# Patient Record
Sex: Female | Born: 1980 | Race: White | Hispanic: Yes | Marital: Married | Smoking: Never smoker
Health system: Southern US, Community
[De-identification: ages and names within clinical notes are randomized; demographics above are authoritative.]

## PROBLEM LIST (undated history)

## (undated) DIAGNOSIS — O24419 Gestational diabetes mellitus in pregnancy, unspecified control: Secondary | ICD-10-CM

## (undated) DIAGNOSIS — F32A Depression, unspecified: Secondary | ICD-10-CM

## (undated) DIAGNOSIS — F329 Major depressive disorder, single episode, unspecified: Secondary | ICD-10-CM

## (undated) DIAGNOSIS — G35 Multiple sclerosis: Secondary | ICD-10-CM

## (undated) HISTORY — PX: NO PAST SURGERIES: SHX2092

## (undated) HISTORY — DX: Depression, unspecified: F32.A

## (undated) HISTORY — DX: Multiple sclerosis: G35

---
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
## 2006-06-07 ENCOUNTER — Inpatient Hospital Stay (HOSPITAL_COMMUNITY): Admission: AD | Admit: 2006-06-07 | Discharge: 2006-06-07 | Payer: Self-pay | Admitting: Obstetrics

## 2006-08-11 ENCOUNTER — Inpatient Hospital Stay (HOSPITAL_COMMUNITY): Admission: AD | Admit: 2006-08-11 | Discharge: 2006-08-11 | Payer: Self-pay | Admitting: Obstetrics

## 2006-08-12 ENCOUNTER — Inpatient Hospital Stay (HOSPITAL_COMMUNITY): Admission: AD | Admit: 2006-08-12 | Discharge: 2006-08-14 | Payer: Self-pay | Admitting: Obstetrics

## 2008-06-25 ENCOUNTER — Encounter: Admission: RE | Admit: 2008-06-25 | Discharge: 2008-06-25 | Payer: Self-pay | Admitting: Family Medicine

## 2008-06-27 ENCOUNTER — Ambulatory Visit (HOSPITAL_COMMUNITY): Admission: RE | Admit: 2008-06-27 | Discharge: 2008-06-27 | Payer: Self-pay | Admitting: Obstetrics & Gynecology

## 2008-07-08 ENCOUNTER — Ambulatory Visit: Payer: Self-pay | Admitting: Obstetrics & Gynecology

## 2008-07-08 ENCOUNTER — Inpatient Hospital Stay (HOSPITAL_COMMUNITY): Admission: AD | Admit: 2008-07-08 | Discharge: 2008-07-08 | Payer: Self-pay | Admitting: Family Medicine

## 2008-10-22 ENCOUNTER — Ambulatory Visit: Payer: Self-pay | Admitting: Family

## 2008-10-22 ENCOUNTER — Inpatient Hospital Stay (HOSPITAL_COMMUNITY): Admission: AD | Admit: 2008-10-22 | Discharge: 2008-10-25 | Payer: Self-pay | Admitting: Obstetrics and Gynecology

## 2011-01-10 LAB — CBC
HCT: 36.3 % (ref 36.0–46.0)
Hemoglobin: 12.1 g/dL (ref 12.0–15.0)
MCHC: 33.3 g/dL (ref 30.0–36.0)
MCV: 87.2 fL (ref 78.0–100.0)
Platelets: 138 10*3/uL — ABNORMAL LOW (ref 150–400)
RBC: 4.17 MIL/uL (ref 3.87–5.11)
RDW: 14.9 % (ref 11.5–15.5)
WBC: 8.9 10*3/uL (ref 4.0–10.5)

## 2011-06-27 LAB — BASIC METABOLIC PANEL
BUN: 5 — ABNORMAL LOW
Calcium: 8.5
GFR calc non Af Amer: >60
Glucose, Bld: 105 — ABNORMAL HIGH
Sodium: 133 — ABNORMAL LOW

## 2011-06-27 LAB — URINE MICROSCOPIC-ADD ON

## 2011-06-27 LAB — URINALYSIS, ROUTINE W REFLEX MICROSCOPIC
Bilirubin Urine: NEGATIVE
Ketones, ur: NEGATIVE
Specific Gravity, Urine: 1.025
Urobilinogen, UA: 0.2

## 2014-01-13 LAB — CYTOLOGY - PAP: Pap: NEGATIVE

## 2015-09-27 NOTE — L&D Delivery Note (Signed)
Delivery Note At 3:35 AM a viable female was delivered via Vaginal, Spontaneous Delivery (Presentation: vertex; OA).  APGAR: 8, 9; weight 8 lb 10.1 oz (3915 g).   Placenta status: spontaneous, intact.  Cord: 3 vessel with the following complications: none.  Cord pH: not obtained  Anesthesia:  Epidural Episiotomy: None Lacerations:  1st degree Suture Repair: 2.0 vicryl Est. Blood Loss (mL):   Mom to postpartum.  Baby to Couplet care / Skin to Skin.  [name] Li 07/04/2016, 3:53 AM

## 2015-12-21 ENCOUNTER — Other Ambulatory Visit (HOSPITAL_COMMUNITY): Payer: Self-pay | Admitting: Physician Assistant

## 2015-12-21 DIAGNOSIS — Z369 Encounter for antenatal screening, unspecified: Secondary | ICD-10-CM

## 2015-12-21 LAB — OB RESULTS CONSOLE ABO/RH: RH TYPE: NEGATIVE

## 2015-12-21 LAB — OB RESULTS CONSOLE HEPATITIS B SURFACE ANTIGEN: Hepatitis B Surface Ag: NEGATIVE

## 2015-12-21 LAB — OB RESULTS CONSOLE GC/CHLAMYDIA
Chlamydia: NEGATIVE
Gonorrhea: NEGATIVE

## 2015-12-21 LAB — OB RESULTS CONSOLE HGB/HCT, BLOOD
HCT: 40 %
Hemoglobin: 13.4 g/dL

## 2015-12-21 LAB — OB RESULTS CONSOLE ANTIBODY SCREEN: Antibody Screen: NEGATIVE

## 2015-12-21 LAB — OB RESULTS CONSOLE PLATELET COUNT: Platelets: 160 10*3/uL

## 2015-12-21 LAB — OB RESULTS CONSOLE RUBELLA ANTIBODY, IGM: RUBELLA: IMMUNE

## 2015-12-21 LAB — OB RESULTS CONSOLE VARICELLA ZOSTER ANTIBODY, IGG: VARICELLA IGG: IMMUNE

## 2015-12-21 LAB — OB RESULTS CONSOLE RPR: RPR: NONREACTIVE

## 2015-12-30 ENCOUNTER — Ambulatory Visit (HOSPITAL_COMMUNITY)
Admission: RE | Admit: 2015-12-30 | Discharge: 2015-12-30 | Disposition: A | Discharge status: Home / Self Care | Payer: Self-pay | Source: Ambulatory Visit | Attending: Physician Assistant | Admitting: Physician Assistant

## 2015-12-30 ENCOUNTER — Encounter (HOSPITAL_COMMUNITY): Payer: Self-pay

## 2015-12-30 DIAGNOSIS — O99211 Obesity complicating pregnancy, first trimester: Secondary | ICD-10-CM | POA: Insufficient documentation

## 2015-12-30 DIAGNOSIS — Z369 Encounter for antenatal screening, unspecified: Secondary | ICD-10-CM

## 2015-12-30 DIAGNOSIS — Z36 Encounter for antenatal screening of mother: Secondary | ICD-10-CM | POA: Insufficient documentation

## 2015-12-30 DIAGNOSIS — O24312 Unspecified pre-existing diabetes mellitus in pregnancy, second trimester: Secondary | ICD-10-CM | POA: Insufficient documentation

## 2015-12-30 DIAGNOSIS — Z3A12 12 weeks gestation of pregnancy: Secondary | ICD-10-CM | POA: Insufficient documentation

## 2015-12-30 HISTORY — DX: Gestational diabetes mellitus in pregnancy, unspecified control: O24.419

## 2015-12-30 NOTE — ED Notes (Signed)
Pt having lower abd cramping and spotting yesterday after moping.  The spotting stopped after resting, continues to have cramping.

## 2016-01-02 ENCOUNTER — Encounter: Payer: Self-pay | Admitting: *Deleted

## 2016-01-02 DIAGNOSIS — Z8632 Personal history of gestational diabetes: Secondary | ICD-10-CM | POA: Insufficient documentation

## 2016-01-02 DIAGNOSIS — O0990 Supervision of high risk pregnancy, unspecified, unspecified trimester: Secondary | ICD-10-CM

## 2016-01-02 DIAGNOSIS — O09299 Supervision of pregnancy with other poor reproductive or obstetric history, unspecified trimester: Secondary | ICD-10-CM | POA: Insufficient documentation

## 2016-01-02 DIAGNOSIS — O24419 Gestational diabetes mellitus in pregnancy, unspecified control: Secondary | ICD-10-CM

## 2016-01-02 HISTORY — DX: Supervision of high risk pregnancy, unspecified, unspecified trimester: O09.90

## 2016-01-04 ENCOUNTER — Ambulatory Visit: Payer: Medicaid Other | Admitting: *Deleted

## 2016-01-04 VITALS — Ht 66.0 in | Wt 218.6 lb

## 2016-01-04 DIAGNOSIS — O219 Vomiting of pregnancy, unspecified: Secondary | ICD-10-CM

## 2016-01-04 MED ORDER — PROMETHAZINE HCL 25 MG PO TABS: 25.0000 mg | ORAL_TABLET | Freq: Four times a day (QID) | ORAL | Status: DC | PRN

## 2016-01-04 NOTE — Progress Notes (Signed)
Nutrition note: GDM diet education Pt has h/o obesity & was recently diagnosed with GDM. Pt has lost 4.4# @ [redacted]w[redacted]d (pregravid wt: 223#). Pt reports she thinks she has lost this wt because she has been eating healthier & stopped drinking soda. Pt also reports having some N&V as well as some heartburn. Pt reports she was taking Phenergan & it was helping but ran out- passed this information onto a nurse to see if it can be refilled.  Pt reports eating 3 meals & 2 snacks/d. Pt is taking a PNV. Pt reports no walking or physical activity. Pt received verbal & written education in Spanish via an interpreter about GDM diet. Provided tips to decrease N&V. Encouraged ~30 mins of walking/d. Discussed wt gain goals of 11-20# or 0.5#/wk. Pt agrees to follow GDM diet with 3 meals & 1-3 snacks/d with proper CHO/ protein combination. Pt does not have WIC but plans to apply. Pt plans to BF. F/u in 4-6 wks Blondell Reveal, MS, RD, LDN, [facility]

## 2016-01-07 ENCOUNTER — Other Ambulatory Visit (HOSPITAL_COMMUNITY): Payer: Self-pay

## 2016-01-07 ENCOUNTER — Encounter: Payer: Self-pay | Admitting: *Deleted

## 2016-01-11 ENCOUNTER — Ambulatory Visit (INDEPENDENT_AMBULATORY_CARE_PROVIDER_SITE_OTHER): Payer: Self-pay | Admitting: Family Medicine

## 2016-01-11 ENCOUNTER — Encounter: Payer: Self-pay | Admitting: Family Medicine

## 2016-01-11 VITALS — BP 126/75 | HR 84 | Temp 98.3°F | Wt 222.9 lb

## 2016-01-11 DIAGNOSIS — O24419 Gestational diabetes mellitus in pregnancy, unspecified control: Secondary | ICD-10-CM

## 2016-01-11 DIAGNOSIS — O360110 Maternal care for anti-D [Rh] antibodies, first trimester, not applicable or unspecified: Secondary | ICD-10-CM

## 2016-01-11 DIAGNOSIS — O26899 Other specified pregnancy related conditions, unspecified trimester: Secondary | ICD-10-CM | POA: Insufficient documentation

## 2016-01-11 DIAGNOSIS — E669 Obesity, unspecified: Secondary | ICD-10-CM

## 2016-01-11 DIAGNOSIS — O99211 Obesity complicating pregnancy, first trimester: Secondary | ICD-10-CM

## 2016-01-11 DIAGNOSIS — O360111 Maternal care for anti-D [Rh] antibodies, first trimester, fetus 1: Secondary | ICD-10-CM

## 2016-01-11 DIAGNOSIS — Z6791 Unspecified blood type, Rh negative: Secondary | ICD-10-CM | POA: Insufficient documentation

## 2016-01-11 DIAGNOSIS — Z113 Encounter for screening for infections with a predominantly sexual mode of transmission: Secondary | ICD-10-CM

## 2016-01-11 DIAGNOSIS — O99210 Obesity complicating pregnancy, unspecified trimester: Secondary | ICD-10-CM | POA: Insufficient documentation

## 2016-01-11 DIAGNOSIS — O0992 Supervision of high risk pregnancy, unspecified, second trimester: Secondary | ICD-10-CM

## 2016-01-11 LAB — POCT URINALYSIS DIP (DEVICE)
BILIRUBIN URINE: NEGATIVE
Glucose, UA: NEGATIVE mg/dL
HGB URINE DIPSTICK: NEGATIVE
Ketones, ur: NEGATIVE mg/dL
Nitrite: NEGATIVE
PH: 7 (ref 5.0–8.0)
Protein, ur: 30 mg/dL — AB
SPECIFIC GRAVITY, URINE: 1.025 (ref 1.005–1.030)
Urobilinogen, UA: 0.2 mg/dL (ref 0.0–1.0)

## 2016-01-11 LAB — HIV ANTIBODY (ROUTINE TESTING W REFLEX): HIV 1&2 Ab, 4th Generation: NONREACTIVE

## 2016-01-11 MED ORDER — GLYBURIDE 2.5 MG PO TABS: 2.5000 mg | ORAL_TABLET | Freq: Every day | ORAL | Status: DC

## 2016-01-11 NOTE — Progress Notes (Signed)
U/S for anatomy scheduled 05/19 @ 10am

## 2016-01-11 NOTE — Progress Notes (Signed)
Subjective:  [name] Li is a 34 y.o. G3P2004 at [redacted]w[redacted]d being seen today for ongoing prenatal care.  She is currently monitored for the following issues for this high-risk pregnancy and has Supervision of high risk pregnancy, antepartum; Gestational diabetes mellitus (GDM), antepartum; Rh negative status during pregnancy, antepartum; and Obesity affecting pregnancy, antepartum on her problem list.  Patient reports no complaints.   .  .  Movement: Present. Denies leaking of fluid.   The following portions of the patient's history were reviewed and updated as appropriate: allergies, current medications, past family history, past medical history, past social history, past surgical history and problem list. Problem list updated.  Objective:   Filed Vitals:   01/11/16 0830  BP: 126/75  Pulse: 84  Temp: 98.3 F (36.8 C)  Weight: 222 lb 14.4 oz (101.107 kg)    Fetal Status: Fetal Heart Rate (bpm): 163   Movement: Present     General:  Alert, oriented and cooperative. Patient is in no acute distress.  Skin: Skin is warm and dry. No rash noted.   Cardiovascular: Normal heart rate noted  Respiratory: Normal respiratory effort, no problems with respiration noted  Abdomen: Soft, gravid, appropriate for gestational age. Pain/Pressure: Present     Pelvic:       Cervical exam deferred        Extremities: Normal range of motion.  Edema: None  Mental Status: Normal mood and affect. Normal behavior. Normal judgment and thought content.   Urinalysis:     Fasting: 82-116 (4 of 7 above) Bfast  94-127 (2 of 6) Lunch 80-209 ( 1 of 6) Dinner 101-116 (none above)  Assessment and Plan:  Pregnancy: G3P2004 at [redacted]w[redacted]d  1. Supervision of high risk pregnancy, antepartum, second trimester - Added box and ordered labs that were not resulted in [facility] prenatal chart - Culture, OB Urine - GC/Chlamydia probe amp ([place])not at [facility] - HIV antibody (with reflex) - Scheduled anatomy US  2.  Gestational diabetes mellitus (GDM), antepartum, gestational diabetes method of control unspecified - Reviewed log and 50% of fasting sugars were above 90. Post prandials are <25%, after bfast is most commonly out of range est when fasting elevated. Will target these with qhs glyburide  - glyBURIDE (DIABETA) 2.5 MG tablet; Take 1 tablet (2.5 mg total) by mouth at bedtime.  Dispense: 30 tablet; Refill: 6  3. Rh negative status during pregnancy, antepartum, first trimester, fetus 1 - rhogam at 28 weeks  4. Obesity affecting pregnancy, antepartum, first trimester -Recommended 11-15 lbs weight gain   Preterm labor symptoms and general obstetric precautions including but not limited to vaginal bleeding, contractions, leaking of fluid and fetal movement were reviewed in detail with the patient. Please refer to After Visit Summary for other counseling recommendations.  Return in about 1 week (around 01/18/2016) for DM follow up- with DM education.   Federico Flake, MD

## 2016-01-11 NOTE — Patient Instructions (Signed)
Diabetes mellitus gestacional (Gestational Diabetes Mellitus) La diabetes mellitus gestacional, ms comnmente conocida como diabetes gestacional es un tipo de diabetes que desarrollan algunas mujeres durante el embarazo. En la diabetes gestacional, el pncreas no produce suficiente insulina (una hormona) o las clulas son menos sensibles a la insulina producida (resistencia a la insulina), o ambas cosas. Normalmente, la insulina mueve los azcares de los alimentos a las clulas de los tejidos. Las clulas de los tejidos utilizan los azcares para obtener energa. La falta de insulina o la falta de una respuesta normal a la insulina hace que el exceso de azcar se acumule en la sangre en lugar de penetrar en las clulas de los tejidos. Como resultado, se producen niveles altos de azcar en la sangre (hiperglucemia). El efecto de los niveles altos de azcar (glucosa) puede causar muchos problemas.  FACTORES DE RIESGO Usted tiene mayor probabilidad de desarrollar diabetes gestacional si tiene antecedentes familiares de diabetes y tambin si tiene uno o ms de los siguientes factores de riesgo:  ndice de masa corporal superior a 30 (obesidad).  Embarazo previo con diabetes gestacional.  La edad avanzada en el momento del embarazo. Si se mantienen los niveles de glucosa en la sangre en un rango normal durante el embarazo, las mujeres pueden tener un embarazo saludable. Si los niveles de glucosa en la sangre no estn bien controlados, puede haber riesgos para usted, el feto o el recin nacido, o durante el trabajo de parto y el parto.  SNTOMAS  Si se presentan sntomas, stos son similares a los sntomas que normalmente experimentar durante el embarazo. Los sntomas de la diabetes gestacional son:   Aumento de la sed (polidipsia).  Aumento de la miccin (poliuria).  Orina con ms frecuencia durante la noche (nocturia).  Prdida de peso. La prdida de peso puede ser muy rpida.  Infecciones  frecuentes y recurrentes.  Cansancio (fatiga).  Debilidad.  Cambios en la visin, como visin borrosa.  Olor a fruta en el aliento.  Dolor abdominal. DIAGNSTICO La diabetes se diagnostica cuando hay aumento de los niveles de glucosa en la sangre. El nivel de glucosa en la sangre puede controlarse en uno o ms de los siguientes anlisis de sangre:  Medicin de glucosa en la sangre en ayunas. No se le permitir comer durante al menos 8 horas antes de que se tome una muestra de sangre.  Pruebas al azar de glucosa en la sangre. El nivel de glucosa en la sangre se controla en cualquier momento del da sin importar el momento en que haya comido.  Prueba de tolerancia a la glucosa oral (PTGO). La glucosa en la sangre se mide despus de no haber comido (ayunas) durante una a tres horas y despus de beber una bebida que contenga glucosa. Dado que las hormonas que causan la resistencia a la insulina son ms altas alrededor de las semanas 24 a 28 de embarazo, generalmente se realiza una PTGO durante ese tiempo. Si tiene factores de riesgo, en la primera visita prenatal pueden hacerle pruebas de deteccin de diabetes tipo 2 no diagnosticada. TRATAMIENTO  La diabetes gestacional debe controlarse en primer lugar con dieta y ejercicios. Pueden agregarse medicamentos, pero solo si son necesarios.  Usted tendr que tomar medicamentos para la diabetes o insulina diariamente para mantener los niveles de glucosa en la sangre en el rango deseado.  Usted tendr que combinar la dosis de insulina con la actividad fsica y la eleccin de alimentos saludables. Si tiene diabetes gestacional, el objetivo del tratamiento ser   mantener los siguientes niveles sanguneos de glucosa:  Antes de las comidas (preprandial): valor de 95 mg/dl o inferior.  Despus de las comidas (posprandial):  Una hora despus de la comida: valor de 140 mg/dl o inferior.  Dos horas despus de la comida: valor de 120 mg/dl o  inferior. Si tiene diabetes tipo 1 o tipo 2 preexistente, el objetivo del tratamiento ser mantener los siguientes niveles sanguneos de glucosa:  Antes de las comidas, a la hora de acostarse y durante la noche: de 60 a 99 mg/dl.  Despus de las comidas: valor mximo de 100 a 129 mg/dl. INSTRUCCIONES PARA EL CUIDADO EN EL HOGAR   Controle su nivel de hemoglobina A1c dos veces al ao.  Contrlese a diario el nivel de glucosa en la sangre segn las indicaciones de su mdico. Es comn realizar controles frecuentes de la glucosa en la sangre.  Supervise las cetonas en la orina cuando est enferma y segn las indicaciones de su mdico.  Tome el medicamento para la diabetes y adminstrese insulina segn las indicaciones de su mdico para mantener el nivel de glucosa en la sangre en el rango deseado.  Nunca se quede sin medicamento para la diabetes o sin insulina. Es necesario que la reciba todos los das.  Ajuste la insulina segn la ingesta de hidratos de carbono. Los hidratos de carbono pueden aumentar los niveles de glucosa en la sangre, pero deben incluirse en su dieta. Los hidratos de carbono aportan vitaminas, minerales y fibra que son una parte esencial de una dieta saludable. Los hidratos de carbono se encuentran en frutas, verduras, cereales integrales, productos lcteos, legumbres y alimentos que contienen azcares aadidos.  Consuma alimentos saludables. Alterne 3 comidas con 3 colaciones.  Aumente de peso saludablemente. El aumento del peso total vara de acuerdo con el ndice de masa corporal que tena antes del embarazo ([facility]).  Lleve una tarjeta de alerta mdica o use una pulsera o medalla de alerta mdica.  Lleve con usted una colacin de 15gramos de hidratos de carbono en todo momento para controlar los niveles bajos de glucosa en la sangre (hipoglucemia). Algunos ejemplos de colaciones de 15gramos de hidratos de carbono son los siguientes:  Tabletas de glucosa, 3 o 4.  Gel  de glucosa, tubo de 15 gramos.  Pasas de uva, 2 cucharadas (24 g).  Caramelos de goma, 6.  Galletas de animales, 8.  Jugo de fruta, gaseosa comn, o leche descremada, 4 onzas (120 ml).  Pastillas de goma, 9.  Reconocer la hipoglucemia. Durante el embarazo la hipoglucemia se produce cuando hay niveles de glucosa en la sangre de 60 mg/dl o menos. El riesgo de hipoglucemia aumenta durante el ayuno o cuando se saltea las comidas, durante o despus de realizar ejercicio intenso y mientras duerme. Los sntomas de hipoglucemia son:  Temblores o sacudidas.  Disminucin de la capacidad de concentracin.  Sudoracin.  Aumento de la frecuencia cardaca.  Dolor de cabeza.  Sequedad en la boca.  Hambre.  Irritabilidad.  Ansiedad.  Sueo agitado.  Alteracin del habla o de la coordinacin.  Confusin.  Tratar la hipoglucemia rpidamente. Si usted est alerta y puede tragar con seguridad, siga la regla de 15/15 que consiste en:  Tome entre 15 y 20gramos de glucosa de accin rpida o carbohidratos. Las opciones de accin rpida son un gel de glucosa, tabletas de glucosa, o 4 onzas (120 ml) de jugo de frutas, gaseosa comn, o leche baja en grasa.  Compruebe su nivel de glucosa en la sangre   15 minutos despus de tomar la glucosa.  Tome entre 15 y 20 gramos ms de glucosa si el nivel de glucosa en la sangre todava es de 70mg/dl o inferior.  Ingiera una comida o una colacin en el lapso de 1 hora una vez que los niveles de glucosa en la sangre vuelven a la normalidad.  Est atento a la poliuria (miccin excesiva) y la polidipsia (sensacin de mucha sed), que son los primeros signos de la hiperglucemia. El reconocimiento temprano de la hiperglucemia permite un tratamiento oportuno. Trate la hiperglucemia segn le indic su mdico.  Haga actividad fsica por lo menos 30minutos al da o como lo indique su mdico. Se recomienda que 30 minutos despus de cada comida, realice diez minutos  de actividad fsica para controlar los niveles de glucosa postprandial en la sangre.  Ajuste su dosis de insulina y la ingesta de alimentos, segn sea necesario, si inicia un nuevo ejercicio o deporte.  Siga su plan para los das de enfermedad cuando no pueda comer o beber como de costumbre.  Evite el tabaco y el alcohol.  Concurra a todas las visitas de control como se lo haya indicado el mdico.  Siga el consejo del mdico respecto a los controles prenatales y posteriores al parto (postparto), las visitas, la planificacin de las comidas, el ejercicio, los medicamentos, las vitaminas, los anlisis de sangre, otras pruebas mdicas y actividades fsicas.  Realice diariamente el cuidado de la piel y de los pies. Examine su piel y los pies diariamente para ver si tiene cortes, moretones, enrojecimiento, problemas en las uas, sangrado, ampollas o llagas.  Cepllese los dientes y encas por lo menos dos veces al da y use hilo dental al menos una vez por da. Concurra regularmente a las visitas de control con el dentista.  Programe un examen de vista durante el primer trimestre de su embarazo o como lo indique su mdico.  Comparta su plan de control de diabetes en el trabajo o en la escuela.  Mantngase al da con las vacunas.  Aprenda a manejar el estrs.  Obtenga la mayor cantidad posible de informacin sobre la diabetes y solicite ayuda siempre que sea necesario.  Obtenga informacin sobre el amamantamiento y analice esta posibilidad.  Debe controlar el nivel de azcar en la sangre de 6a 12semanas despus del parto. Esto se hace con una prueba de tolerancia a la glucosa oral (PTGO). SOLICITE ATENCIN MDICA SI:   No puede comer alimentos o beber por ms de 6 horas.  Tuvo nuseas o ha vomitado durante ms de 6 horas.  Tiene un nivel de glucosa en la sangre de 200 mg/dl y cetonas en la orina.  Presenta algn cambio en el estado mental.  Desarrolla problemas de visin.  Sufre  un dolor persistente de cabeza.  Siente dolor o molestias en la parte superior del abdomen.  Desarrolla una enfermedad grave adicional.  Tuvo diarrea durante ms de 6 horas.  Ha estado enfermo o ha tenido fiebre durante un par de das y no mejora. SOLICITE ATENCIN MDICA DE INMEDIATO SI:   Tiene dificultad para respirar.  Ya no siente los movimientos del beb.  Est sangrando o tiene flujo vaginal.  Comienza a tener contracciones o trabajo de parto prematuro. ASEGRESE DE QUE:  Comprende estas instrucciones.  Controlar su afeccin.  Recibir ayuda de inmediato si no mejora o si empeora.   Esta informacin no tiene como fin reemplazar el consejo del mdico. Asegrese de hacerle al mdico cualquier pregunta que tenga.     Document Released: 06/22/2005 Document Revised: 10/03/2014 Elsevier Interactive Patient Education 2016 Elsevier Inc.  

## 2016-01-12 LAB — GC/CHLAMYDIA PROBE AMP (~~LOC~~) NOT AT ARMC
CHLAMYDIA, DNA PROBE: NEGATIVE
Neisseria Gonorrhea: NEGATIVE

## 2016-01-12 LAB — CULTURE, OB URINE
Colony Count: NO GROWTH
ORGANISM ID, BACTERIA: NO GROWTH

## 2016-01-18 ENCOUNTER — Ambulatory Visit: Payer: Self-pay | Admitting: *Deleted

## 2016-01-18 ENCOUNTER — Encounter: Payer: Self-pay | Attending: Family Medicine | Admitting: *Deleted

## 2016-01-18 DIAGNOSIS — O219 Vomiting of pregnancy, unspecified: Secondary | ICD-10-CM

## 2016-01-18 DIAGNOSIS — Z029 Encounter for administrative examinations, unspecified: Secondary | ICD-10-CM | POA: Insufficient documentation

## 2016-01-18 MED ORDER — PROMETHAZINE HCL 25 MG PO TABS: 25.0000 mg | ORAL_TABLET | Freq: Four times a day (QID) | ORAL | Status: DC | PRN

## 2016-01-18 NOTE — Progress Notes (Signed)
  Patient was seen on 01/18/16  for Gestational Diabetes self-management follow up visit  She already has True Track Meter , will need more strips and lancets today. The following learning objectives were met by the patient during this course:   States the definition of Gestational Diabetes  Demonstrates carbohydrate counting   States when to check blood glucose levels  Demonstrates proper blood glucose monitoring techniques  Patient demonstrates causes, symptoms and treatment of hypoglycemia  Patient instructed to monitor glucose levels: FBS: 60 - <90 2 hour: <120  Patient received Box of 50 True Track Strips and box of 100 Lancets  Nutrition Diabetes and Pregnancy  Carbohydrate Counting List  Patient will be seen for follow-up as needed.

## 2016-01-18 NOTE — Progress Notes (Deleted)
Subjective:     Patient ID: [name] Li, female   DOB: 10-May-1981, 35 y.o.   MRN: [id number]  HPI   Review of Systems     Objective:   Physical Exam     Assessment:     ***    Plan:     ***

## 2016-01-25 ENCOUNTER — Encounter: Payer: Self-pay | Admitting: Advanced Practice Midwife

## 2016-01-26 ENCOUNTER — Telehealth: Payer: Self-pay | Admitting: General Practice

## 2016-01-26 NOTE — Telephone Encounter (Signed)
Per Dr Adrian Blackwater, patient needs to come in for 24hr Urine protein, tsh, CMP, and HgA1c preferably before Monday so she can bring her urine that day. Called patient with pacific interpreter [phone], no answer- unable to leave message. Called patient's emergency contact # and he states he isn't currently with her but will tell her to call us back.

## 2016-02-01 ENCOUNTER — Ambulatory Visit (INDEPENDENT_AMBULATORY_CARE_PROVIDER_SITE_OTHER): Payer: Self-pay | Admitting: Obstetrics & Gynecology

## 2016-02-01 VITALS — BP 123/72 | HR 86 | Wt 222.4 lb

## 2016-02-01 DIAGNOSIS — O24410 Gestational diabetes mellitus in pregnancy, diet controlled: Secondary | ICD-10-CM

## 2016-02-01 DIAGNOSIS — K219 Gastro-esophageal reflux disease without esophagitis: Secondary | ICD-10-CM

## 2016-02-01 DIAGNOSIS — O99612 Diseases of the digestive system complicating pregnancy, second trimester: Secondary | ICD-10-CM

## 2016-02-01 DIAGNOSIS — O0992 Supervision of high risk pregnancy, unspecified, second trimester: Secondary | ICD-10-CM

## 2016-02-01 LAB — POCT URINALYSIS DIP (DEVICE)
Glucose, UA: NEGATIVE mg/dL
Hgb urine dipstick: NEGATIVE
KETONES UR: NEGATIVE mg/dL
Nitrite: NEGATIVE
Specific Gravity, Urine: 1.025 (ref 1.005–1.030)
UROBILINOGEN UA: 0.2 mg/dL (ref 0.0–1.0)
pH: 7 (ref 5.0–8.0)

## 2016-02-01 MED ORDER — PANTOPRAZOLE SODIUM 40 MG PO TBEC: 40.0000 mg | DELAYED_RELEASE_TABLET | Freq: Every day | ORAL | Status: DC

## 2016-02-01 NOTE — Progress Notes (Signed)
Subjective:  [name] Li is a 35 y.o. G3P2002 at [redacted]w[redacted]d being seen today for ongoing prenatal care.  She is currently monitored for the following issues for this high-risk pregnancy and has Supervision of high risk pregnancy, antepartum; Gestational diabetes mellitus (GDM), antepartum; Rh negative status during pregnancy, antepartum; and Obesity affecting pregnancy, antepartum on her problem list.  Patient reports headache, heartburn, nausea and vomiting.   .  .  Movement: Present. Denies leaking of fluid.   The following portions of the patient's history were reviewed and updated as appropriate: allergies, current medications, past family history, past medical history, past social history, past surgical history and problem list. Problem list updated.  Objective:   Filed Vitals:   02/01/16 1021  BP: 123/72  Pulse: 86  Weight: 222 lb 6.4 oz (100.88 kg)    Fetal Status: Fetal Heart Rate (bpm): 160   Movement: Present     General:  Alert, oriented and cooperative. Patient is in no acute distress.  Skin: Skin is warm and dry. No rash noted.   Cardiovascular: Normal heart rate noted  Respiratory: Normal respiratory effort, no problems with respiration noted  Abdomen: Soft, gravid, appropriate for gestational age. Pain/Pressure: Present     Pelvic:       Cervical exam deferred        Extremities: Normal range of motion.     Mental Status: Normal mood and affect. Normal behavior. Normal judgment and thought content.   Urinalysis: Urine Protein: 3+ Urine Glucose: Negative  Assessment and Plan:  Pregnancy: G3P2002 at [redacted]w[redacted]d  1. Gastroesophageal reflux disease, esophagitis presence not specified Patient endorses exacerbation of reflux in the past week, particularly in the evenings. She has two episodes of emesis daily (morning and afternoon) for much of the pregnancy, largely unrelieved by promethazine twice daily. We will add on a PPI to reduce the reflux symptoms and perhaps some  of the nausea as well.  - pantoprazole (PROTONIX) 40 MG tablet; Take 1 tablet (40 mg total) by mouth daily.  Dispense: 30 tablet; Refill: 6   2. Supervision of High Risk Pregnancy with GDM Reviewed blood sugar log; patient is consistent with checks, and majority of values are within appropriate limits. We will not change her from current daily Glyburide. Preterm labor symptoms and general obstetric precautions including but not limited to vaginal bleeding, contractions, leaking of fluid and fetal movement were reviewed in detail with the patient. Please refer to After Visit Summary for other counseling recommendations.  No Follow-up on file.   Hessie Knows, Med Student

## 2016-02-01 NOTE — Patient Instructions (Signed)
Hiperemesis gravdica (Hyperemesis Gravidarum) La hiperemesis gravdica es una forma grave de nuseas y vmitos que ocurren durante el Psychiatrist Es peor que las nuseas matutinas. Puede provocarle nuseas o vmitos todo el da, [address]. Posiblemente provoque que no coma ni beba la cantidad suficiente de alimentos y lquidos. Generalmente ocurre durante la primera mitad (las primeras 20 semanas) de Psychiatrist. En general mejora en la segunda mitad del embarazo. Pero en algunos casos continua durante todo el embarazo.  CAUSAS  La causa de esta afeccin no se conoce por completo, pero se cree que est relacionada con los [employer] hormonas del organismo durante el [place]. Se podra dar debido a un nivel alto de la hormona del Psychiatrist o a un aumento de Stage manager.  SIGNOS Y SNTOMAS   Nuseas y vmitos intensos.  Nuseas no mejoran.  Vmitos que le impiden [employer].  Prdida de peso y de lquidos corporales (deshidratacin).  No tener deseos de comer ni tener agrado por los alimentos que antes disfrutaba. DIAGNSTICO  El mdico le preguntar acerca de sus sntomas y le har un examen fsico. Posiblemente tambin le indique anlisis de sangre y de [place] para asegurarse de que no haya otra causa del problema.  TRATAMIENTO  Es posible que nicamente necesite tomar algunos medicamentos para Wellsite geologist. Si los medicamentos no controlan las nuseas y los vmitos, recibir un Pharmacist, hospital hospital para prevenir la deshidratacin, un aumento de la acidez en la sangre (acidosis), la prdida de peso y las modificaciones electrolticas en el organismo que podran perjudicar al beb en gestacin (feto). Probablemente necesite recibir lquidos por va intravenosa (IV).  INSTRUCCIONES PARA EL CUIDADO EN EL HOGAR   Tome solo medicamentos de venta libre o recetados, segn las indicaciones del mdico.  Trate de comer algunas galletitas secas o tostadas  durante la [place], antes de levantarse de la cama.  Evite los alimentos y los Electronics engineer.  Evite los alimentos grasos y muy condimentados.  Coma 5 o 6 comidas pequeas por da.  No beba mientras coma. Tome lquidos [employer].  Para las colaciones, consuma alimentos ricos en protenas, como queso.  Coma o succione alimentos que contengan jengibre. El jengibre [employer]  Evite preparar las comidas. El olor de la comida puede quitarle el apetito.  Evite los comprimidos de hierro y las multivitaminas que contengan hierro hasta despus del 3. o 4.mes de Psychiatrist. No obstante, consulte con el mdico antes de dejar de tomar cualquier tipo de comprimido de hierro que le hayan recetado. SOLICITE ATENCIN MDICA SI:   El dolor abdominal aumenta.  Sufre un dolor intenso de [place].  Tiene problemas de visin.  Pierde peso. SOLICITE ATENCIN MDICA DE INMEDIATO SI:   No puede retener los lquidos.  Vomita sangre.  Tiene nuseas y vmitos constantes.  Se siente excesivamente dbil.  Siente sed extrema.  Tiene mareos o [employer].  Tiene fiebre o sntomas persistentes durante ms de 2a 3das.  Tiene fiebre y los sntomas empeoran repentinamente. ASEGRESE DE QUE:   Comprende estas instrucciones.  Controlar su afeccin.  Recibir ayuda de inmediato si no mejora o si empeora.   Esta informacin no tiene Theme park manager el consejo del mdico. Asegrese de hacerle al mdico cualquier pregunta que tenga.   Document Released: 09/12/2005 Document Revised: 09/17/2013 Elsevier Interactive Patient Education [employer]

## 2016-02-04 LAB — ALPHA FETOPROTEIN, MATERNAL
AFP: 23.4 ng/mL
Curr Gest Age: 17.3 weeks
MOM FOR AFP: 0.82
Open Spina bifida: NEGATIVE
Osb Risk: 1:25400 {titer}

## 2016-02-09 NOTE — Telephone Encounter (Signed)
Unable to reach patient however does appt scheduled for 02/15/2016

## 2016-02-12 ENCOUNTER — Other Ambulatory Visit: Payer: Self-pay | Admitting: Family Medicine

## 2016-02-12 ENCOUNTER — Encounter (HOSPITAL_COMMUNITY): Payer: Self-pay

## 2016-02-12 ENCOUNTER — Ambulatory Visit (HOSPITAL_COMMUNITY)
Admission: RE | Admit: 2016-02-12 | Discharge: 2016-02-12 | Disposition: A | Discharge status: Home / Self Care | Payer: Self-pay | Source: Ambulatory Visit | Attending: Family Medicine | Admitting: Family Medicine

## 2016-02-12 VITALS — BP 108/70 | HR 98 | Wt 221.6 lb

## 2016-02-12 DIAGNOSIS — Z3A18 18 weeks gestation of pregnancy: Secondary | ICD-10-CM | POA: Insufficient documentation

## 2016-02-12 DIAGNOSIS — O99212 Obesity complicating pregnancy, second trimester: Secondary | ICD-10-CM

## 2016-02-12 DIAGNOSIS — O0992 Supervision of high risk pregnancy, unspecified, second trimester: Secondary | ICD-10-CM | POA: Insufficient documentation

## 2016-02-12 DIAGNOSIS — Z36 Encounter for antenatal screening of mother: Secondary | ICD-10-CM | POA: Insufficient documentation

## 2016-02-12 DIAGNOSIS — O24419 Gestational diabetes mellitus in pregnancy, unspecified control: Secondary | ICD-10-CM

## 2016-02-12 DIAGNOSIS — O360120 Maternal care for anti-D [Rh] antibodies, second trimester, not applicable or unspecified: Secondary | ICD-10-CM | POA: Insufficient documentation

## 2016-02-12 DIAGNOSIS — E669 Obesity, unspecified: Secondary | ICD-10-CM | POA: Insufficient documentation

## 2016-02-12 DIAGNOSIS — Z0489 Encounter for examination and observation for other specified reasons: Secondary | ICD-10-CM

## 2016-02-12 DIAGNOSIS — IMO0002 Reserved for concepts with insufficient information to code with codable children: Secondary | ICD-10-CM

## 2016-02-12 DIAGNOSIS — O24415 Gestational diabetes mellitus in pregnancy, controlled by oral hypoglycemic drugs: Secondary | ICD-10-CM | POA: Insufficient documentation

## 2016-02-15 ENCOUNTER — Ambulatory Visit (INDEPENDENT_AMBULATORY_CARE_PROVIDER_SITE_OTHER): Payer: Self-pay | Admitting: Obstetrics and Gynecology

## 2016-02-15 VITALS — BP 116/67 | HR 80 | Temp 98.3°F | Wt 224.0 lb

## 2016-02-15 DIAGNOSIS — O24410 Gestational diabetes mellitus in pregnancy, diet controlled: Secondary | ICD-10-CM

## 2016-02-15 LAB — POCT URINALYSIS DIP (DEVICE)
Bilirubin Urine: NEGATIVE
Glucose, UA: NEGATIVE mg/dL
HGB URINE DIPSTICK: NEGATIVE
KETONES UR: NEGATIVE mg/dL
Nitrite: NEGATIVE
Protein, ur: 100 mg/dL — AB
SPECIFIC GRAVITY, URINE: 1.02 (ref 1.005–1.030)
UROBILINOGEN UA: 1 mg/dL (ref 0.0–1.0)
pH: 6.5 (ref 5.0–8.0)

## 2016-02-15 LAB — TSH: TSH: 2.07 m[IU]/L

## 2016-02-15 MED ORDER — SUCRALFATE 1 G PO TABS: 1.0000 g | ORAL_TABLET | Freq: Three times a day (TID) | ORAL | Status: DC

## 2016-02-15 MED ORDER — ASPIRIN EC 81 MG PO TBEC: 81.0000 mg | DELAYED_RELEASE_TABLET | Freq: Every day | ORAL | Status: DC

## 2016-02-15 NOTE — Progress Notes (Signed)
Subjective:  [name] Li is a 35 y.o. G3P2002 at [redacted]w[redacted]d being seen today for ongoing prenatal care.  She is currently monitored for the following issues for this high-risk pregnancy and has Supervision of high risk pregnancy, antepartum; Gestational diabetes mellitus (GDM), antepartum; Rh negative status during pregnancy, antepartum; and Obesity affecting pregnancy, antepartum on her problem list.  Patient reports no complaints.   .  .   . Denies leaking of fluid.   The following portions of the patient's history were reviewed and updated as appropriate: allergies, current medications, past family history, past medical history, past social history, past surgical history and problem list. Problem list updated.  Objective:  There were no vitals filed for this visit.  Fetal Status:           General:  Alert, oriented and cooperative. Patient is in no acute distress.  Skin: Skin is warm and dry. No rash noted.   Cardiovascular: Normal heart rate noted  Respiratory: Normal respiratory effort, no problems with respiration noted  Abdomen: Soft, gravid, appropriate for gestational age.       Pelvic:       Cervical exam deferred        Extremities: Normal range of motion.     Mental Status: Normal mood and affect. Normal behavior. Normal judgment and thought content.   Urinalysis:      Assessment and Plan:  Pregnancy: G3P2002 at [redacted]w[redacted]d  # A2/BDM - echo scheduled - cmp, a1c, tsh, upc, 24-hour ordered - start aspirin   Preterm labor symptoms and general obstetric precautions including but not limited to vaginal bleeding, contractions, leaking of fluid and fetal movement were reviewed in detail with the patient. Please refer to After Visit Summary for other counseling recommendations.    Kathrynn Running, MD

## 2016-02-16 LAB — PROTEIN / CREATININE RATIO, URINE
Creatinine, Urine: 210 mg/dL (ref 20–320)
PROTEIN CREATININE RATIO: 476 mg/g{creat} — AB (ref 21–161)
Total Protein, Urine: 100 mg/dL — ABNORMAL HIGH (ref 5–24)

## 2016-02-16 LAB — HIV ANTIBODY (ROUTINE TESTING W REFLEX): HIV 1&2 Ab, 4th Generation: NONREACTIVE

## 2016-02-29 ENCOUNTER — Encounter: Payer: Self-pay | Admitting: Obstetrics and Gynecology

## 2016-02-29 LAB — COMPREHENSIVE METABOLIC PANEL
ALBUMIN: 3.6 g/dL (ref 3.6–5.1)
ALK PHOS: 49 U/L (ref 33–115)
ALT: 21 U/L (ref 6–29)
AST: 17 U/L (ref 10–30)
BILIRUBIN TOTAL: 0.5 mg/dL (ref 0.2–1.2)
BUN: 4 mg/dL — ABNORMAL LOW (ref 7–25)
CALCIUM: 8.6 mg/dL (ref 8.6–10.2)
CO2: 20 mmol/L (ref 20–31)
CREATININE: 0.49 mg/dL — AB (ref 0.50–1.10)
Chloride: 103 mmol/L (ref 98–110)
Glucose, Bld: 131 mg/dL — ABNORMAL HIGH (ref 65–99)
Potassium: 3.9 mmol/L (ref 3.5–5.3)
SODIUM: 134 mmol/L — AB (ref 135–146)
TOTAL PROTEIN: 6.6 g/dL (ref 6.1–8.1)

## 2016-02-29 LAB — HEMOGLOBIN A1C
HEMOGLOBIN A1C: 5.7 % — AB (ref <5.7)
MEAN PLASMA GLUCOSE: 117 mg/dL

## 2016-03-02 LAB — CREATININE CLEARANCE, URINE, 24 HOUR
CREAT CLEAR: 180 mL/min — AB (ref 75–115)
CREATININE, URINE: 67 mg/dL (ref 20–320)
CREATININE: 0.49 mg/dL — AB (ref 0.50–1.10)
Creatinine, 24H Ur: 1.27 g/(24.h) (ref 0.63–2.50)

## 2016-03-02 LAB — PROTEIN, URINE, 24 HOUR
Protein, 24H Urine: 361 mg/24 h — ABNORMAL HIGH (ref <150)
Protein, Urine: 19 mg/dL (ref 5–24)

## 2016-03-07 ENCOUNTER — Ambulatory Visit (INDEPENDENT_AMBULATORY_CARE_PROVIDER_SITE_OTHER): Payer: Self-pay | Admitting: Family Medicine

## 2016-03-07 VITALS — BP 116/70 | HR 88 | Wt 225.4 lb

## 2016-03-07 DIAGNOSIS — O24419 Gestational diabetes mellitus in pregnancy, unspecified control: Secondary | ICD-10-CM

## 2016-03-07 DIAGNOSIS — E669 Obesity, unspecified: Secondary | ICD-10-CM

## 2016-03-07 DIAGNOSIS — O0992 Supervision of high risk pregnancy, unspecified, second trimester: Secondary | ICD-10-CM

## 2016-03-07 DIAGNOSIS — O360111 Maternal care for anti-D [Rh] antibodies, first trimester, fetus 1: Secondary | ICD-10-CM

## 2016-03-07 DIAGNOSIS — O99211 Obesity complicating pregnancy, first trimester: Secondary | ICD-10-CM

## 2016-03-07 LAB — POCT URINALYSIS DIP (DEVICE)
Glucose, UA: NEGATIVE mg/dL
Hgb urine dipstick: NEGATIVE
KETONES UR: NEGATIVE mg/dL
Nitrite: NEGATIVE
PH: 7.5 (ref 5.0–8.0)
Protein, ur: 100 mg/dL — AB
Specific Gravity, Urine: 1.015 (ref 1.005–1.030)
Urobilinogen, UA: 0.2 mg/dL (ref 0.0–1.0)

## 2016-03-07 MED ORDER — GLYBURIDE 2.5 MG PO TABS: ORAL_TABLET | ORAL | Status: DC

## 2016-03-07 NOTE — Progress Notes (Signed)
Subjective:  [name] Li is a 35 y.o. G3P2002 at [redacted]w[redacted]d being seen today for ongoing prenatal care.  She is currently monitored for the following issues for this high-risk pregnancy and has Supervision of high risk pregnancy, antepartum; Gestational diabetes mellitus (GDM), antepartum; Rh negative status during pregnancy, antepartum; and Obesity affecting pregnancy, antepartum on her problem list.  Patient reports no complaints.   .  .  Movement: Present. Denies leaking of fluid.   The following portions of the patient's history were reviewed and updated as appropriate: allergies, current medications, past family history, past medical history, past social history, past surgical history and problem list. Problem list updated.  Objective:   Filed Vitals:   03/07/16 1111  BP: 116/70  Pulse: 88  Weight: 225 lb 6.4 oz (102.241 kg)    Fetal Status: Fetal Heart Rate (bpm): 154 Fundal Height: 22 cm Movement: Present     General:  Alert, oriented and cooperative. Patient is in no acute distress.  Skin: Skin is warm and dry. No rash noted.   Cardiovascular: Normal heart rate noted  Respiratory: Normal respiratory effort, no problems with respiration noted  Abdomen: Soft, gravid, appropriate for gestational age. Pain/Pressure: Present     Pelvic: Cervical exam deferred        Extremities: Normal range of motion.     Mental Status: Normal mood and affect. Normal behavior. Normal judgment and thought content.   Urinalysis:     Fasting 70-88 PP at lunch and dinner 50% above goal. Range is 100-150 Assessment and Plan:  Pregnancy: G3P2002 at [redacted]w[redacted]d  1. Rh negative status during pregnancy, antepartum, first trimester, fetus 1 -rhogam at 28 wks  2. Supervision of high risk pregnancy, antepartum, second trimester -updated box and reviewed pregnancy complaints  3. Obesity affecting pregnancy, antepartum, first trimester  4. Gestational diabetes mellitus (GDM), antepartum, gestational  diabetes method of control unspecified - 2hr pp are elevated. Will increase AM glyburide - glyBURIDE (DIABETA) 2.5 MG tablet; Take 1 tablet in the morning and 2 tablets prior to bedtime  Dispense: 90 tablet; Refill: 2 - fetal echo ordered  Preterm labor symptoms and general obstetric precautions including but not limited to vaginal bleeding, contractions, leaking of fluid and fetal movement were reviewed in detail with the patient. Please refer to After Visit Summary for other counseling recommendations.   Return in about 2 weeks (around 03/21/2016) for With DM Education to check sugars on new dose of glyburide.   Federico Flake, MD

## 2016-03-11 ENCOUNTER — Ambulatory Visit (HOSPITAL_COMMUNITY)
Admission: RE | Admit: 2016-03-11 | Discharge: 2016-03-11 | Disposition: A | Discharge status: Home / Self Care | Payer: Self-pay | Source: Ambulatory Visit | Attending: Family Medicine | Admitting: Family Medicine

## 2016-03-11 ENCOUNTER — Encounter (HOSPITAL_COMMUNITY): Payer: Self-pay

## 2016-03-11 VITALS — BP 120/73 | HR 95 | Wt 228.6 lb

## 2016-03-11 DIAGNOSIS — Z3A22 22 weeks gestation of pregnancy: Secondary | ICD-10-CM | POA: Insufficient documentation

## 2016-03-11 DIAGNOSIS — O99212 Obesity complicating pregnancy, second trimester: Secondary | ICD-10-CM | POA: Insufficient documentation

## 2016-03-11 DIAGNOSIS — O24415 Gestational diabetes mellitus in pregnancy, controlled by oral hypoglycemic drugs: Secondary | ICD-10-CM | POA: Insufficient documentation

## 2016-03-11 DIAGNOSIS — Z0489 Encounter for examination and observation for other specified reasons: Secondary | ICD-10-CM

## 2016-03-11 DIAGNOSIS — O26892 Other specified pregnancy related conditions, second trimester: Secondary | ICD-10-CM | POA: Insufficient documentation

## 2016-03-11 DIAGNOSIS — IMO0002 Reserved for concepts with insufficient information to code with codable children: Secondary | ICD-10-CM

## 2016-03-21 ENCOUNTER — Ambulatory Visit (INDEPENDENT_AMBULATORY_CARE_PROVIDER_SITE_OTHER): Payer: Self-pay | Admitting: Family Medicine

## 2016-03-21 VITALS — BP 124/80 | HR 95 | Wt 231.3 lb

## 2016-03-21 DIAGNOSIS — O24415 Gestational diabetes mellitus in pregnancy, controlled by oral hypoglycemic drugs: Secondary | ICD-10-CM

## 2016-03-21 DIAGNOSIS — O0992 Supervision of high risk pregnancy, unspecified, second trimester: Secondary | ICD-10-CM

## 2016-03-21 DIAGNOSIS — O24419 Gestational diabetes mellitus in pregnancy, unspecified control: Secondary | ICD-10-CM

## 2016-03-21 LAB — POCT URINALYSIS DIP (DEVICE)
Bilirubin Urine: NEGATIVE
GLUCOSE, UA: NEGATIVE mg/dL
Hgb urine dipstick: NEGATIVE
KETONES UR: NEGATIVE mg/dL
Nitrite: NEGATIVE
PROTEIN: 100 mg/dL — AB
SPECIFIC GRAVITY, URINE: 1.02 (ref 1.005–1.030)
UROBILINOGEN UA: 1 mg/dL (ref 0.0–1.0)
pH: 7 (ref 5.0–8.0)

## 2016-03-21 MED ORDER — GLYBURIDE 5 MG PO TABS: ORAL_TABLET | ORAL | Status: DC

## 2016-03-21 NOTE — Progress Notes (Signed)
Pt c/o sciatica left side

## 2016-03-21 NOTE — Patient Instructions (Addendum)
Tomar nuevo med-1 cada maana y 1,5 a la hora de [employer] Medicina vieja - 2 cada maana y 3 a la hora de Teacher, music   Diabetes mellitus gestacional (Gestational Diabetes Mellitus) La diabetes mellitus gestacional, ms comnmente conocida como diabetes gestacional es un tipo de diabetes que desarrollan algunas mujeres durante el [place]. En la diabetes gestacional, el pncreas no produce suficiente insulina (una hormona) o las clulas son menos sensibles a la insulina producida (resistencia a la insulina), o ambas cosas. Normalmente, la [employer] azcares de los alimentos a las clulas de los tejidos. Las clulas de los tejidos [employer] azcares para Psychiatrist. La falta de insulina o la falta de una respuesta normal a la insulina hace que el exceso de azcar se acumule en la sangre en lugar de Customer service manager en las clulas de los tejidos. Como resultado, se producen niveles altos de Banker (hiperglucemia). El [address] de los niveles altos de International aid/development worker (glucosa) puede causar muchos problemas.  FACTORES DE RIESGO Usted tiene mayor probabilidad de desarrollar diabetes gestacional si tiene antecedentes familiares de diabetes y tambin si tiene uno o ms de los siguientes factores de riesgo:  ndice de masa corporal superior a 30 (obesidad).  Embarazo previo con diabetes gestacional.  La edad avanzada en el momento del embarazo. Si se mantienen los niveles de glucosa en la sangre en un rango normal durante el [place], las mujeres pueden tener un embarazo saludable. Si los niveles de glucosa en la sangre no estn bien controlados, puede haber riesgos para usted, el feto o el recin nacido, o durante el [place] de parto y [place].  SNTOMAS  Si se presentan sntomas, stos son similares a los sntomas que normalmente experimentar durante el embarazo. Los sntomas de la diabetes gestacional son:   [name] de la sed (polidipsia).  Aumento de la miccin (poliuria).  Orina con  ms frecuencia durante la noche (nocturia).  Prdida de peso. La prdida de peso puede ser muy rpida.  Infecciones frecuentes y recurrentes.  Cansancio (fatiga).  Debilidad.  Cambios en la visin, como visin borrosa.  Olor a Water quality scientist.  Dolor abdominal. DIAGNSTICO La diabetes se diagnostica cuando hay aumento de los niveles de glucosa en la [place]. El nivel de glucosa en la sangre puede controlarse en uno o ms de los siguientes anlisis de sangre:  Medicin de glucosa en la sangre en [place]. No se le permitir comer durante al menos 8 horas antes de que se tome [place] de [place].  Pruebas al azar de glucosa en la sangre. El nivel de glucosa en la sangre se controla en cualquier momento del da sin importar el momento en que haya comido.  Prueba de tolerancia a la glucosa oral (PTGO). La glucosa en la sangre se mide despus de no haber comido (ayunas) durante una a tres horas y despus de beber una bebida que contenga glucosa. Dado que las hormonas que causan la resistencia a la insulina son ms altas alrededor Countrywide Financial 24 a 28 de Psychiatrist, generalmente se realiza una PTGO durante ese tiempo. Si tiene factores de riesgo, en la primera visita prenatal pueden hacerle pruebas de deteccin de diabetes tipo 2 no diagnosticada. TRATAMIENTO  La diabetes gestacional debe controlarse en primer lugar con dieta y ejercicios. Pueden agregarse medicamentos, pero solo si son necesarios.  Usted tendr que tomar medicamentos para la diabetes o insulina diariamente para Pharmacologist los niveles de glucosa en la sangre en el rango deseado.  Usted tendr [employer] Express dosis de insulina con la actividad fsica y la eleccin de alimentos saludables. Si tiene diabetes gestacional, el objetivo del tratamiento ser [employer] siguientes niveles sanguneos de glucosa:  Antes de las comidas (preprandial): valor de 95 mg/dl o inferior.  Despus de las comidas (posprandial):  Una hora  despus de la comida: valor de 140 mg/dl o inferior.  Dos horas despus de la comida: valor de 120 mg/dl o inferior. Si tiene diabetes tipo 1 o tipo 2 preexistente, el objetivo del tratamiento ser [employer] siguientes niveles sanguneos de glucosa:  Antes de las comidas, a la hora de acostarse y durante la noche: de 60 a 99 mg/dl.  Despus de las comidas: valor mximo de 100 a 129 mg/dl. INSTRUCCIONES PARA EL CUIDADO EN EL HOGAR   Controle su nivel de hemoglobina A1c dos veces al ao.  Contrlese a diario el nivel de glucosa en la sangre segn las indicaciones de su mdico. Es comn Education officer, environmental controles frecuentes de la glucosa en la [place].  Supervise las cetonas en la orina cuando est enferma y segn las indicaciones de su mdico.  Tome el medicamento para la diabetes y adminstrese insulina segn las indicaciones de su mdico para Radio producer nivel de glucosa en la sangre en el rango deseado.  Nunca se quede sin medicamento para la diabetes o sin insulina. Es necesario que la reciba [employer].  Ajuste la insulina segn la ingesta de hidratos de carbono. Los hidratos de carbono pueden aumentar los niveles de glucosa en la sangre, pero deben incluirse en su dieta. Los hidratos de carbono aportan vitaminas, minerales y [place] que son [place]omia Dear parte esencial de una dieta saludable. Los hidratos de carbono se encuentran en frutas, verduras, cereales integrales, productos lcteos, legumbres y alimentos que contienen azcares aadidos.  Consuma alimentos saludables. Alterne 3 comidas con 3 colaciones.  Aumente de peso saludablemente. El aumento del peso total vara de acuerdo con el ndice de masa corporal que tena antes del embarazo [facility]).  Lleve una tarjeta de alerta mdica o use una pulsera o medalla de alerta mdica.  Lleve con usted una colacin de 15gramos de hidratos de carbono en todo momento para controlar los niveles bajos de glucosa en la sangre (hipoglucemia). Algunos ejemplos  de colaciones de 15gramos de hidratos de carbono son los siguientes:  Tabletas de glucosa, 3 o 4.  Gel de glucosa, tubo de 15 gramos.  Pasas de uva, 2 cucharadas (24 g).  Caramelos de goma, 6.  Galletas de [place], 8.  Jugo de fruta, gaseosa comn, o [place], 4 onzas (120 ml).  Pastillas de goma, 9.  Reconocer la hipoglucemia. Durante el embarazo la hipoglucemia se produce cuando hay niveles de glucosa en la sangre de 60 mg/dl o menos. El riesgo de hipoglucemia aumenta durante el ayuno o cuando se saltea las comidas, durante o despus de Education officer, environmental ejercicio intenso y [place] duerme. Los sntomas de hipoglucemia son:  Temblores o sacudidas.  Disminucin de la capacidad de concentracin.  Sudoracin.  Aumento de la frecuencia cardaca.  Dolor de [place].  Sequedad en la boca.  Hambre.  Irritabilidad.  Ansiedad.  Sueo agitado.  Alteracin del habla o de la coordinacin.  Confusin.  Tratar la hipoglucemia rpidamente. Si usted est alerta y puede tragar con seguridad, siga la regla de 15/15 que consiste en:  [employer] 15 y 20gramos de glucosa de accin rpida o carbohidratos. Las opciones de accin rpida son un gel de glucosa, tabletas  de glucosa, o 4 onzas (120 ml) de jugo de frutas, gaseosa comn, o leche baja en grasa.  Compruebe su nivel de glucosa en la sangre 15 minutos despus de tomar la glucosa.  Tome entre 15 y 20 gramos ms de glucosa si el nivel de glucosa en la sangre todava es de 70mg /dl o inferior.  Ingiera una comida o una colacin en el lapso de 1 hora una vez que los niveles de glucosa en la sangre vuelven a la normalidad.  Est atento a la poliuria (miccin excesiva) y la polidipsia (sensacin de mucha sed), que son los primeros signos de la hiperglucemia. El reconocimiento temprano de la hiperglucemia permite un tratamiento oportuno. Trate la hiperglucemia segn le indic su mdico.  Haga actividad fsica por lo menos  al da o como lo indique su mdico. Se recomienda que 30 minutos despus de cada comida, realice diez minutos de actividad fsica para controlar los niveles de glucosa postprandial en la [place].  Ajuste su dosis de insulina y la ingesta de alimentos, segn sea necesario, si inicia un nuevo ejercicio o deporte.  Siga su plan para los [address] de enfermedad cuando no pueda comer o beber como de [place].  Evite el tabaco y el alcohol.  Concurra a todas las visitas de control como se lo haya indicado el mdico.  Siga el consejo del mdico respecto a los controles prenatales y posteriores al parto (postparto), las visitas, la planificacin de las comidas, el ejercicio, los medicamentos, las vitaminas, los anlisis de [place], otras pruebas mdicas y [place] fsicas.  Realice diariamente el cuidado de la piel y de los pies. Examine su piel y los pies diariamente para ver si tiene cortes, moretones, enrojecimiento, problemas en las uas, sangrado, ampollas o Advertising account planner.  Cepllese los dientes y encas por lo menos dos veces al da y use hilo dental al menos una vez por da. Concurra regularmente a las visitas de control con el dentista.  Programe un examen de vista durante el primer trimestre de su embarazo o como lo indique su mdico.  Comparta su plan de control de diabetes en el trabajo o en la escuela.  Mantngase al da con las vacunas.  Aprenda a Dealer.  Obtenga la mayor cantidad posible de informacin sobre la diabetes y solicite ayuda siempre que sea necesario.  Obtenga informacin sobre el amamantamiento y analice esta posibilidad.  Debe controlar el nivel de azcar en la sangre de 6a 12semanas despus del parto. Esto se hace con una prueba de tolerancia a la glucosa oral (PTGO). SOLICITE ATENCIN MDICA SI:   No puede comer alimentos o beber por ms de 6 horas.  Tuvo nuseas o ha vomitado durante ms de 6 horas.  Tiene un nivel de glucosa en la sangre de 200 mg/dl y  cetonas en la orina.  Presenta algn cambio en el estado mental.  Desarrolla problemas de visin.  Sufre un dolor persistente de Training and development officer.  Siente dolor o molestias en la parte superior del abdomen.  Desarrolla una enfermedad grave adicional.  Tuvo diarrea durante ms de 6 horas.  Ha estado enfermo o ha tenido fiebre durante un par [address] y no mejora. SOLICITE ATENCIN MDICA DE INMEDIATO SI:   Tiene dificultad para respirar.  Ya no siente los movimientos del beb.  Est sangrando o tiene flujo vaginal.  Comienza a tener contracciones o trabajo de [place] prematuro. ASEGRESE DE QUE:  Comprende estas instrucciones.  Controlar su afeccin.  Recibir ayuda de inmediato si  no mejora o si empeora.   Esta informacin no tiene Theme park manager el consejo del mdico. Asegrese de hacerle al mdico cualquier pregunta que tenga.   Document Released: 06/22/2005 Document Revised: 10/03/2014 Elsevier Interactive Patient Education 2016 [employer].  [place] materna (Breastfeeding) Decidir Museum/gallery exhibitions officer es una de las mejores elecciones que puede hacer por usted y su beb. El cambio hormonal durante el Psychiatrist produce el desarrollo del tejido mamario y [place] la cantidad y el tamao de los conductos galactforos. Estas hormonas tambin permiten que las protenas, los azcares y las grasas de la sangre produzcan la [employer] materna en las glndulas productoras de [place]. Las hormonas impiden que la leche materna sea liberada antes del nacimiento del beb, adems de impulsar el flujo de leche luego del nacimiento. Una vez que ha comenzado a Museum/gallery exhibitions officer, Conservation officer, nature beb, as Immunologist succin o Theatre manager, pueden estimular la liberacin de [place] de las glndulas productoras de [place].  LOS BENEFICIOS DE AMAMANTAR Para el beb  La primera leche (calostro) ayuda a Careers information officer funcionamiento del sistema digestivo del beb.  La leche tiene anticuerpos que ayudan a Radio producer las infecciones en el  beb.  El beb tiene una menor incidencia de asma, alergias y del sndrome de muerte sbita del lactante.  Los nutrientes en la [place] materna son mejores para el beb que la [place] maternizada y estn preparados exclusivamente para cubrir las necesidades del beb.  La leche materna mejora el desarrollo cerebral del beb.  Es menos probable que el beb desarrolle otras enfermedades, como obesidad infantil, asma o diabetes mellitus de tipo 2. Para usted   La lactancia materna favorece el desarrollo de un vnculo muy especial entre la madre y el beb.  Es conveniente. La leche materna siempre est disponible a la Human resources officer y es [place].  La lactancia materna ayuda a quemar caloras y a perder el peso ganado durante el [place].  Favorece la contraccin del tero al tamao que tena antes del embarazo de manera ms rpida y disminuye el sangrado (loquios) despus del parto.  La lactancia materna contribuye a reducir Nurse, adult de desarrollar diabetes mellitus de tipo 2, osteoporosis o cncer de mama o de ovario en el futuro. SIGNOS DE QUE EL BEB EST HAMBRIENTO Primeros signos de [address] de [place].  Se estira.  Mueve la cabeza de un lado a otro.  Mueve la cabeza y abre la boca cuando se le toca la mejilla o la comisura de la boca (reflejo de bsqueda).  Aumenta las vocalizaciones, tales como sonidos de succin, se relame los labios, emite arrullos, suspiros, o chirridos.  Mueve la [employer] boca.  Se chupa con ganas los dedos o las manos. Signos tardos de [employer].  Llora de manera intermitente. Signos de [employer] Corporation signos de hambre extrema requerirn que lo calme y lo consuele antes de que el beb pueda alimentarse adecuadamente. No espere a que se manifiesten los siguientes signos de hambre extrema para comenzar a Museum/gallery exhibitions officer:   Designer, jewellery.  Llanto intenso y fuerte.  Gritos. INFORMACIN BSICA SOBRE LA  LACTANCIA MATERNA Iniciacin de la lactancia materna  Encuentre un lugar cmodo para sentarse o acostarse, con un buen respaldo para el cuello y la espalda.  Coloque una almohada o una manta enrollada debajo del beb para acomodarlo a la altura de la mama (si est sentada). Las almohadas para Museum/gallery exhibitions officer se han diseado especialmente a fin de Secretary/administrator de apoyo  para los brazos y el beb [address].  Asegrese de que el abdomen del beb est frente al suyo.   Masajee suavemente la mama. Con las yemas de los dedos, masajee la pared del pecho hacia el pezn en un movimiento circular. Esto estimula el flujo de [place]. Es posible que Engineer, manufacturing systems este movimiento mientras amamanta si la leche fluye lentamente.  Sostenga la mama con el pulgar por arriba del pezn y los otros 4 dedos por debajo de la mama. Asegrese de que los dedos se encuentren lejos del pezn y de la boca del beb.  Empuje suavemente los labios del beb con el pezn o con el dedo.  Cuando la boca del beb se abra lo suficiente, acrquelo rpidamente a la mama e introduzca todo el pezn y la zona oscura que lo rodea (areola), tanto como sea posible, dentro de la boca del beb.  Debe haber ms areola visible por arriba del labio superior del beb que por debajo del labio inferior.  La lengua del beb debe estar entre la enca inferior y la [place].  Asegrese de que la boca del beb est en la posicin correcta alrededor del pezn (prendida). Los labios del beb deben crear un sello sobre la mama y estar doblados hacia afuera (invertidos).  Es comn que el beb succione durante 2 a 3 minutos para que comience el flujo de [place]. Cmo debe prenderse Es muy importante que le ensee al beb cmo prenderse adecuadamente a la mama. Si el beb no se prende adecuadamente, puede causarle dolor en el pezn y reducir la produccin de [place], y hacer que el beb tenga un escaso aumento de [place]. Adems, si el beb no se prende  adecuadamente al pezn, puede tragar aire durante la alimentacin. Esto puede causarle molestias al beb. Hacer eructar al beb al [place] de mama puede ayudarlo a liberar el aire. Sin embargo, ensearle al beb cmo prenderse a la mama adecuadamente es la mejor manera de evitar que se sienta molesto por tragar Oceanographer se alimenta. Signos de que el beb se ha prendido adecuadamente al pezn:   Payton Doughty o succiona de modo silencioso, sin causarle dolor.  Se escucha que traga cada 3 o 4 succiones.  Hay movimientos musculares por arriba y por delante de sus odos al Printmaker. Signos de que el beb no se ha prendido Audiological scientist al pezn:   Hace ruidos de succin o de chasquido mientras se alimenta.  Siente dolor en el pezn. Si cree que el beb no se prendi correctamente, deslice el dedo en la comisura de la boca y [employer] las encas del beb para interrumpir la succin. Intente comenzar a amamantar nuevamente. Signos de Fish farm manager Signos del beb:   Disminuye gradualmente el nmero de succiones o cesa la succin por completo.  Se duerme.  Relaja el cuerpo.  Retiene una pequea cantidad de [facility] boca.  Se desprende solo del pecho. Signos que presenta usted:  Las mamas han aumentado la firmeza, el peso y el tamao 1 a 3 horas despus de Museum/gallery exhibitions officer.  Estn ms blandas inmediatamente despus de amamantar.  Un aumento del volumen de [place], y tambin un cambio en su consistencia y color se producen hacia el quinto da de Tour manager.  Los pezones no duelen, ni estn agrietados ni sangran. Signos de que su beb recibe la cantidad de leche suficiente  Moja al menos 3 paales en 24 horas. La orina debe ser clara y de  color amarillo plido a los 5 [address] de vida.  Defeca al menos 3 veces en 24 horas a los 5 [address] de [address] La materia fecal debe ser blanda y [place].  Defeca al menos 3 veces en 24 horas a los [address] de [address] La materia fecal debe  ser grumosa y [place].  No registra una prdida de peso mayor del 10% del peso al nacer durante los primeros 3 [address] de [place].  Aumenta de peso un promedio de 4 a 7onzas (113 a 198g) por semana despus de los 4 [address] de vida.  Aumenta de [place], [place], de [place] uniforme a Glass blower/designer de los 5 [address] de vida, sin Passenger transport manager prdida de peso despus de las 2semanas de vida. Despus de alimentarse, es posible que el beb regurgite una pequea cantidad. Esto es frecuente. FRECUENCIA Y DURACIN DE LA LACTANCIA MATERNA El amamantamiento frecuente la ayudar a producir ms [place] y a Education officer, community de Engineer, mining en los pezones e hinchazn en las [place]. Alimente al beb cuando muestre signos de hambre o si siente la necesidad de reducir la congestin de las [place]. Esto se denomina "lactancia a demanda". Evite el uso del chupete mientras trabaja para establecer la lactancia (las primeras 4 a 6 semanas despus del nacimiento del beb). Despus de este perodo, podr ofrecerle un chupete. Las investigaciones demostraron que el uso del chupete durante el primer ao de vida del beb disminuye el riesgo de desarrollar el sndrome de muerte sbita del lactante (SMSL). Permita que el nio se alimente en cada mama todo lo que desee. Contine amamantando al beb hasta que haya terminado de alimentarse. Cuando el beb se desprende o se queda dormido mientras se est alimentando de la primera mama, ofrzcale la segunda. Debido a que, con frecuencia, los recin [place] las primeras semanas de vida, es posible que deba despertar al beb para alimentarlo. Los horarios de Acupuncturist de un beb a otro. Sin embargo, las siguientes reglas pueden servir como gua para ayudarla a Lawyer que el beb se alimenta adecuadamente:  Se puede amamantar a los recin nacidos (bebs de 4 semanas o menos de vida) cada 1 a 3 horas.  No deben transcurrir ms de 3 horas durante el da o 5 horas durante la noche  sin que se amamante a los recin nacidos.  Debe amamantar al beb 8 veces como mnimo en un perodo de 24 horas, hasta que comience a introducir slidos en su dieta, a los 6 meses de vida aproximadamente. EXTRACCIN DE [employer] MATERNA La extraccin y Contractor de la leche materna le permiten asegurarse de que el beb se alimente exclusivamente de [place], aun en momentos en los que no puede amamantar. Esto tiene especial importancia si debe regresar al Aleen Campi en el perodo en que an est amamantando o si no puede estar presente en los momentos en que el beb debe alimentarse. Su asesor en lactancia puede orientarla sobre cunto tiempo es seguro almacenar [place].  El sacaleche es un aparato que le permite extraer leche de la mama a un recipiente estril. Luego, la leche materna extrada puede almacenarse en un refrigerador o Electrical engineer. Algunos sacaleches son Birdie Riddle, Delaney Meigs otros son elctricos. Consulte a su asesor en lactancia qu tipo ser ms conveniente para usted. Los sacaleches se pueden comprar; sin embargo, algunos hospitales y grupos de apoyo a la lactancia materna alquilan Sports coach. Un asesor en lactancia puede ensearle cmo extraer [employer]ley, en caso de que prefiera no  usar Buyer, retail.  CMO CUIDAR LAS MAMAS DURANTE LA LACTANCIA MATERNA Los pezones se secan, agrietan y duelen durante la Tour manager. Las siguientes recomendaciones pueden ayudarla a Pharmacologist las [employer] y sanas:  Careers information officer usar jabn en los pezones.  Use un sostn de soporte. Aunque no son esenciales, las camisetas sin mangas o los sostenes especiales para Museum/gallery exhibitions officer estn diseados para acceder fcilmente a las mamas, para Museum/gallery exhibitions officer sin tener que quitarse todo el sostn o la camiseta. Evite usar sostenes con aro o sostenes muy ajustados.  Seque al aire sus pezones durante 3 a despus de amamantar al beb.  Utilice solo apsitos de Energy manager sostn para Environmental health practitioner las prdidas de [place]. La prdida de un poco de Public [employer] Enterprise Group tomas es normal.  Utilice lanolina sobre los pezones luego de Museum/gallery exhibitions officer. La lanolina ayuda a mantener la humedad normal de la piel. Si [place] lanolina pura, no tiene que lavarse los pezones antes de volver a Corporate treasurer al beb. La lanolina pura no es txica para el beb. Adems, puede extraer [employer] algunas gotas de [place] materna y Engineer, maintenance (IT) suavemente esa [employer], para que la [place] se seque al aire. Durante las primeras semanas despus de dar a luz, algunas mujeres pueden experimentar hinchazn en las mamas (congestin [place]). La congestin puede hacer que sienta las mamas pesadas, calientes y sensibles al tacto. El pico de la congestin ocurre dentro de los 3 a 5 das despus del [place]. Las siguientes recomendaciones pueden ayudarla a Paramedic la congestin:  Vace por completo las mamas al [employer]COMM o Environmental health practitioner. Puede aplicar calor hmedo en las mamas (en la ducha o con toallas hmedas para manos) antes de Museum/gallery exhibitions officer o extraer [employer]. Esto aumenta la circulacin y [place] a que la [place]. Si el beb no vaca por completo las [address] cuando lo [address], extraiga la [place] restante despus de que haya finalizado.  Use un sostn ajustado (para amamantar o comn) o una camiseta sin mangas durante 1 o 2 das para indicar al cuerpo que disminuya ligeramente la produccin de [place].  Aplique compresas de hielo [employer], a menos que le resulte demasiado incmodo.  Asegrese de que el beb est prendido y se encuentre en la posicin correcta mientras lo alimenta. Si la congestin persiste luego de 48 horas o despus de seguir estas recomendaciones, comunquese con su mdico o un Holiday representative. RECOMENDACIONES GENERALES PARA EL CUIDADO DE LA SALUD DURANTE LA LACTANCIA MATERNA  Consuma alimentos saludables. Alterne comidas y colaciones, y coma 3 de cada una por da. Dado que lo que  come [employer], es posible que algunas comidas hagan que su beb se vuelva ms irritable de lo habitual. Evite comer este tipo de alimentos si percibe que afectan de manera negativa al beb.  Beba leche, jugos de fruta y agua para Patent examiner su sed (aproximadamente 10 vasos al Futures trader).  Descanse con frecuencia, reljese y tome sus vitaminas prenatales para evitar la fatiga, el estrs y la anemia.  Contine con los autocontroles de la mama.  Evite Product manager y fumar tabaco. Las sustancias qumicas de los cigarrillos que pasan a la leche materna y la exposicin al humo ambiental del tabaco pueden daar al beb.  No consuma alcohol ni drogas, incluida la marihuana. Algunos medicamentos, que pueden ser perjudiciales para el beb, pueden pasar a travs de la [employer]. Es importante que consulte a su mdico antes de Medical sales representative, incluidos todos los [employer]  recetados y de [address], as como los suplementos vitamnicos y herbales. Puede quedar embarazada durante la lactancia. Si desea controlar la natalidad, consulte a su mdico cules son las opciones ms seguras para el beb. SOLICITE ATENCIN MDICA SI:   Usted siente que quiere dejar de Museum/gallery exhibitions officer o se siente frustrada con la lactancia.  Siente dolor en las mamas o en los pezones.  Sus pezones estn agrietados o Water quality scientist.  Sus pechos estn irritados, sensibles o calientes.  Tiene un rea hinchada en cualquiera de las mamas.  Siente escalofros o fiebre.  Tiene nuseas o vmitos.  Presenta una secrecin de otro lquido distinto de la leche materna de los pezones.  Sus mamas no se llenan antes de Museum/gallery exhibitions officer al beb para el quinto da despus del [place].  Se siente triste y deprimida.  El beb est demasiado somnoliento como para comer bien.  El beb tiene problemas para dormir.  Moja menos de 3 paales en 24 horas.  Defeca menos de 3 veces en 24 horas.  La piel del beb o la parte blanca de los  ojos se vuelven amarillentas.  El beb no ha aumentado de [place] a los [address] de [place]. SOLICITE ATENCIN MDICA DE INMEDIATO SI:   El beb est muy cansado Retail buyer) y no se quiere despertar para comer.  Le sube la fiebre sin causa.   Esta informacin no tiene Theme park manager el consejo del mdico. Asegrese de hacerle al mdico cualquier pregunta que tenga.   Document Released: 09/12/2005 Document Revised: 06/03/2015 Elsevier Interactive Patient Education [employer]

## 2016-03-21 NOTE — Progress Notes (Signed)
Subjective:  [name] Li is a 35 y.o. G3P2002 at [redacted]w[redacted]d being seen today for ongoing prenatal care.  She is currently monitored for the following issues for this high-risk pregnancy and has Supervision of high risk pregnancy, antepartum; Gestational diabetes mellitus (GDM), antepartum; Rh negative status during pregnancy, antepartum; and Obesity affecting pregnancy, antepartum on her problem list.  Patient reports sciatica.  Contractions: Not present. Vag. Bleeding: None.  Movement: Present. Denies leaking of fluid.   The following portions of the patient's history were reviewed and updated as appropriate: allergies, current medications, past family history, past medical history, past social history, past surgical history and problem list. Problem list updated.  Objective:   Filed Vitals:   03/21/16 1112  BP: 124/80  Pulse: 95  Weight: 231 lb 4.8 oz (104.917 kg)    Fetal Status: Fetal Heart Rate (bpm): 150 Fundal Height: 25 cm Movement: Present     General:  Alert, oriented and cooperative. Patient is in no acute distress.  Skin: Skin is warm and dry. No rash noted.   Cardiovascular: Normal heart rate noted  Respiratory: Normal respiratory effort, no problems with respiration noted  Abdomen: Soft, gravid, appropriate for gestational age. Pain/Pressure: Present     Pelvic: Cervical exam deferred        Extremities: Normal range of motion.  Edema: Mild pitting, slight indentation  Mental Status: Normal mood and affect. Normal behavior. Normal judgment and thought content.   Urinalysis: Urine Protein: 2+ Urine Glucose: Negative FBS 77-124 almost all are out of rnage 2 hr pp 75-367 most are out of range Assessment and Plan:  Pregnancy: G3P2002 at [redacted]w[redacted]d  1. Supervision of high risk pregnancy, antepartum, second trimester Continue prenatal care. 28 wk labs and TDaP next visit  2. Gestational diabetes mellitus (GDM) controlled on oral hypoglycemic drug, antepartum Increase  Glyburide to 5 mg q am and 7.5 mg q hs - glyBURIDE (DIABETA) 5 MG tablet; 1 po q am, 1.5 po q hs  Dispense: 90 tablet; Refill: 2  3. Sciatica Belly band, rest, ice/heat  Preterm labor symptoms and general obstetric precautions including but not limited to vaginal bleeding, contractions, leaking of fluid and fetal movement were reviewed in detail with the patient. Please refer to After Visit Summary for other counseling recommendations.  Return in 2 weeks (on 04/04/2016).   Reva Bores, MD

## 2016-04-05 ENCOUNTER — Ambulatory Visit (INDEPENDENT_AMBULATORY_CARE_PROVIDER_SITE_OTHER): Payer: Self-pay | Admitting: Advanced Practice Midwife

## 2016-04-05 VITALS — BP 107/81 | HR 84 | Wt 234.9 lb

## 2016-04-05 DIAGNOSIS — O24415 Gestational diabetes mellitus in pregnancy, controlled by oral hypoglycemic drugs: Secondary | ICD-10-CM

## 2016-04-05 DIAGNOSIS — O219 Vomiting of pregnancy, unspecified: Secondary | ICD-10-CM

## 2016-04-05 DIAGNOSIS — O26892 Other specified pregnancy related conditions, second trimester: Secondary | ICD-10-CM

## 2016-04-05 DIAGNOSIS — O0992 Supervision of high risk pregnancy, unspecified, second trimester: Secondary | ICD-10-CM

## 2016-04-05 DIAGNOSIS — R12 Heartburn: Secondary | ICD-10-CM

## 2016-04-05 MED ORDER — PROMETHAZINE HCL 25 MG PO TABS: 12.5000 mg | ORAL_TABLET | Freq: Four times a day (QID) | ORAL | Status: DC | PRN

## 2016-04-05 MED ORDER — SUCRALFATE 1 G PO TABS: 1.0000 g | ORAL_TABLET | Freq: Three times a day (TID) | ORAL | Status: DC

## 2016-04-05 NOTE — Progress Notes (Signed)
Spanish interpreter "Sue Lush" [phone] used

## 2016-04-05 NOTE — Progress Notes (Signed)
Subjective:  [name] Li is a 35 y.o. G3P2002 at [redacted]w[redacted]d being seen today for ongoing prenatal care.  She is currently monitored for the following issues for this high-risk pregnancy and has Supervision of high risk pregnancy, antepartum; Gestational diabetes mellitus (GDM), antepartum; Rh negative status during pregnancy, antepartum; and Obesity affecting pregnancy, antepartum on her problem list.  Patient reports occasional nausea and heartburn.  Contractions: Not present. Vag. Bleeding: None.  Movement: Present. Denies leaking of fluid.   The following portions of the patient's history were reviewed and updated as appropriate: allergies, current medications, past family history, past medical history, past social history, past surgical history and problem list. Problem list updated.  Objective:   Filed Vitals:   04/05/16 1205  BP: 107/81  Pulse: 84  Weight: 234 lb 14.4 oz (106.55 kg)    Fetal Status: Fetal Heart Rate (bpm): 150 Fundal Height: 27 cm Movement: Present     General:  Alert, oriented and cooperative. Patient is in no acute distress.  Skin: Skin is warm and dry. No rash noted.   Cardiovascular: Normal heart rate noted  Respiratory: Normal respiratory effort, no problems with respiration noted  Abdomen: Soft, gravid, appropriate for gestational age. Pain/Pressure: Present     Pelvic:  Cervical exam deferred        Extremities: Normal range of motion.  Edema: Mild pitting, slight indentation  Mental Status: Normal mood and affect. Normal behavior. Normal judgment and thought content.   Urinalysis:      Assessment and Plan:  Pregnancy: G3P2002 at [redacted]w[redacted]d  1. Supervision of high risk pregnancy, antepartum, second trimester --28 week labs not done today r/t late visit time, plan to get labs and give Rhophylac at next visit in 2 weeks. Discussed plan with pt.   2. Gestational diabetes mellitus (GDM) controlled on oral hypoglycemic drug, antepartum   3. Nausea and  vomiting during pregnancy --Renewed Rx for Phenergan. - promethazine (PHENERGAN) 25 MG tablet; Take 0.5-1 tablets (12.5-25 mg total) by mouth every 6 (six) hours as needed for nausea or vomiting.  Dispense: 30 tablet; Refill: 0  4. Heartburn during pregnancy in second trimester --Renewed Rx for Carafate and recommend pt take Prevacid daily as previously recommended.  Preterm labor symptoms and general obstetric precautions including but not limited to vaginal bleeding, contractions, leaking of fluid and fetal movement were reviewed in detail with the patient. Please refer to After Visit Summary for other counseling recommendations.  Return in about 2 weeks (around 04/19/2016).   Hurshel Party, CNM

## 2016-04-22 ENCOUNTER — Ambulatory Visit (HOSPITAL_COMMUNITY)
Admission: RE | Admit: 2016-04-22 | Discharge: 2016-04-22 | Disposition: A | Discharge status: Home / Self Care | Payer: Self-pay | Source: Ambulatory Visit | Attending: Family Medicine | Admitting: Family Medicine

## 2016-04-22 ENCOUNTER — Other Ambulatory Visit (HOSPITAL_COMMUNITY): Payer: Self-pay | Admitting: Maternal and Fetal Medicine

## 2016-04-22 ENCOUNTER — Encounter (HOSPITAL_COMMUNITY): Payer: Self-pay

## 2016-04-22 DIAGNOSIS — O24415 Gestational diabetes mellitus in pregnancy, controlled by oral hypoglycemic drugs: Secondary | ICD-10-CM | POA: Insufficient documentation

## 2016-04-22 DIAGNOSIS — O99211 Obesity complicating pregnancy, first trimester: Secondary | ICD-10-CM

## 2016-04-22 DIAGNOSIS — O99213 Obesity complicating pregnancy, third trimester: Secondary | ICD-10-CM

## 2016-04-22 DIAGNOSIS — O360130 Maternal care for anti-D [Rh] antibodies, third trimester, not applicable or unspecified: Secondary | ICD-10-CM

## 2016-04-22 DIAGNOSIS — O26893 Other specified pregnancy related conditions, third trimester: Secondary | ICD-10-CM | POA: Insufficient documentation

## 2016-04-22 DIAGNOSIS — O360111 Maternal care for anti-D [Rh] antibodies, first trimester, fetus 1: Secondary | ICD-10-CM

## 2016-04-22 DIAGNOSIS — Z3A28 28 weeks gestation of pregnancy: Secondary | ICD-10-CM

## 2016-04-22 DIAGNOSIS — O0992 Supervision of high risk pregnancy, unspecified, second trimester: Secondary | ICD-10-CM

## 2016-04-22 DIAGNOSIS — Z6791 Unspecified blood type, Rh negative: Secondary | ICD-10-CM | POA: Insufficient documentation

## 2016-04-25 ENCOUNTER — Other Ambulatory Visit (HOSPITAL_COMMUNITY): Payer: Self-pay

## 2016-04-27 ENCOUNTER — Ambulatory Visit (INDEPENDENT_AMBULATORY_CARE_PROVIDER_SITE_OTHER): Payer: Self-pay | Admitting: Obstetrics & Gynecology

## 2016-04-27 VITALS — BP 125/80 | HR 90 | Wt 233.0 lb

## 2016-04-27 DIAGNOSIS — Z23 Encounter for immunization: Secondary | ICD-10-CM

## 2016-04-27 DIAGNOSIS — O24415 Gestational diabetes mellitus in pregnancy, controlled by oral hypoglycemic drugs: Secondary | ICD-10-CM

## 2016-04-27 DIAGNOSIS — O360930 Maternal care for other rhesus isoimmunization, third trimester, not applicable or unspecified: Secondary | ICD-10-CM

## 2016-04-27 DIAGNOSIS — O0992 Supervision of high risk pregnancy, unspecified, second trimester: Secondary | ICD-10-CM

## 2016-04-27 LAB — CBC
HCT: 35.5 % (ref 35.0–45.0)
HEMOGLOBIN: 11.6 g/dL — AB (ref 11.7–15.5)
MCH: 27.3 pg (ref 27.0–33.0)
MCHC: 32.7 g/dL (ref 32.0–36.0)
MCV: 83.5 fL (ref 80.0–100.0)
PLATELETS: 160 10*3/uL (ref 140–400)
RBC: 4.25 MIL/uL (ref 3.80–5.10)
RDW: 14.8 % (ref 11.0–15.0)
WBC: 7.5 10*3/uL (ref 3.8–10.8)

## 2016-04-27 LAB — POCT URINALYSIS DIP (DEVICE)
BILIRUBIN URINE: NEGATIVE
Glucose, UA: 100 mg/dL — AB
Ketones, ur: NEGATIVE mg/dL
NITRITE: NEGATIVE
PH: 6.5 (ref 5.0–8.0)
PROTEIN: NEGATIVE mg/dL
Specific Gravity, Urine: 1.015 (ref 1.005–1.030)
UROBILINOGEN UA: 0.2 mg/dL (ref 0.0–1.0)

## 2016-04-27 MED ORDER — ASPIRIN EC 81 MG PO TBEC: 81.0000 mg | DELAYED_RELEASE_TABLET | Freq: Every day | ORAL | 2 refills | Status: DC

## 2016-04-27 MED ORDER — RHO D IMMUNE GLOBULIN 1500 UNIT/2ML IJ SOSY
300.0000 ug | PREFILLED_SYRINGE | Freq: Once | INTRAMUSCULAR | Status: AC
  Administered 2016-04-27: 300 ug via INTRAMUSCULAR

## 2016-04-27 NOTE — Patient Instructions (Signed)
Regrese a la clinica cuando tenga su cita. Si tiene problemas o preguntas, llama a la clinica o vaya a la sala de emergencia al [facility].    

## 2016-04-27 NOTE — Progress Notes (Signed)
Video interpreter [phone] used for check in  Patient requests rx for baby aspirin- has not been taking

## 2016-04-27 NOTE — Progress Notes (Signed)
Subjective:  [name] Li is a 35 y.o. G3P2002 at [redacted]w[redacted]d being seen today for ongoing prenatal care.  Patient is Spanish-speaking only, Spanish interpreter present for this encounter. She is currently monitored for the following issues for this high-risk pregnancy and has Supervision of high risk pregnancy, antepartum; Gestational diabetes mellitus (GDM), antepartum; Rh negative status during pregnancy, antepartum; and Obesity affecting pregnancy, antepartum on her problem list.  Patient reports no complaints.  Contractions: Irritability. Vag. Bleeding: None.  Movement: Present. Denies leaking of fluid.   The following portions of the patient's history were reviewed and updated as appropriate: allergies, current medications, past family history, past medical history, past social history, past surgical history and problem list. Problem list updated.  Objective:   Vitals:   04/27/16 1300  BP: 125/80  Pulse: 90  Weight: 233 lb (105.7 kg)    Fetal Status: Fetal Heart Rate (bpm): 154 Fundal Height: 30 cm Movement: Present     General:  Alert, oriented and cooperative. Patient is in no acute distress.  Skin: Skin is warm and dry. No rash noted.   Cardiovascular: Normal heart rate noted  Respiratory: Normal respiratory effort, no problems with respiration noted  Abdomen: Soft, gravid, appropriate for gestational age. Pain/Pressure: Present     Pelvic:  Cervical exam deferred        Extremities: Normal range of motion.  Edema: Trace  Mental Status: Normal mood and affect. Normal behavior. Normal judgment and thought content.   Urinalysis: Urine Protein: Negative Urine Glucose: 1+  Assessment and Plan:  Pregnancy: G3P2002 at [redacted]w[redacted]d  1. Gestational diabetes mellitus (GDM) in third trimester controlled on oral hypoglycemic drug Blood sugars are under control. Continue Glyburide 5 mg/7.5 mg. Will start antenatal testing at 32 weeks. - CBC - RPR - HIV antibody (with reflex) - Tdap  vaccine greater than or equal to 7yo IM - rho (d) immune globulin (RHIG/RHOPHYLAC) injection 300 mcg; Inject 2 mLs (300 mcg total) into the muscle once. - aspirin EC 81 MG tablet; Take 1 tablet (81 mg total) by mouth daily. Take after 12 weeks for prevention of preeclampsia later in pregnancy  Dispense: 300 tablet; Refill: 2  2. Supervision of high risk pregnancy, antepartum, second trimester Preterm labor symptoms and general obstetric precautions including but not limited to vaginal bleeding, contractions, leaking of fluid and fetal movement were reviewed in detail with the patient. Please refer to After Visit Summary for other counseling recommendations.  Return in about 2 weeks (around 05/11/2016) for OB Visit. 3 weeks: OB visit and NST.   Tereso Newcomer, MD

## 2016-04-28 LAB — HIV ANTIBODY (ROUTINE TESTING W REFLEX): HIV: NONREACTIVE

## 2016-04-28 LAB — RPR

## 2016-05-12 ENCOUNTER — Ambulatory Visit (INDEPENDENT_AMBULATORY_CARE_PROVIDER_SITE_OTHER): Payer: Self-pay | Admitting: Family

## 2016-05-12 DIAGNOSIS — O360131 Maternal care for anti-D [Rh] antibodies, third trimester, fetus 1: Secondary | ICD-10-CM

## 2016-05-12 DIAGNOSIS — O24415 Gestational diabetes mellitus in pregnancy, controlled by oral hypoglycemic drugs: Secondary | ICD-10-CM

## 2016-05-12 DIAGNOSIS — O0993 Supervision of high risk pregnancy, unspecified, third trimester: Secondary | ICD-10-CM

## 2016-05-12 LAB — POCT URINALYSIS DIP (DEVICE)
BILIRUBIN URINE: NEGATIVE
GLUCOSE, UA: NEGATIVE mg/dL
HGB URINE DIPSTICK: NEGATIVE
KETONES UR: NEGATIVE mg/dL
Nitrite: NEGATIVE
Protein, ur: 100 mg/dL — AB
SPECIFIC GRAVITY, URINE: 1.015 (ref 1.005–1.030)
Urobilinogen, UA: 1 mg/dL (ref 0.0–1.0)
pH: 7 (ref 5.0–8.0)

## 2016-05-12 NOTE — Progress Notes (Signed)
Sugars (2 weeks prior to 8/17)  AM Fasting: 75-93 (3/14 abnormal) After Breakfast: 75-199 (7/14 abnormal)  After Lunch: 89-170 (1/14 abnormal) After Dinner: 41-937 (0/14 abnormal)  Subjective:  [name] Li is a 35 y.o. G3P2002 at [redacted]w[redacted]d being seen today for ongoing prenatal care.  She is currently monitored for the following issues for this high-risk pregnancy and has Supervision of high risk pregnancy, antepartum; Gestational diabetes mellitus (GDM), antepartum; Rh negative status during pregnancy, antepartum; and Obesity affecting pregnancy, antepartum on her problem list.  Patient reports no complaints.  Contractions: Not present. Vag. Bleeding: None.  Movement: Present. Denies leaking of fluid.   The following portions of the patient's history were reviewed and updated as appropriate: allergies, current medications, past family history, past medical history, past social history, past surgical history and problem list. Problem list updated.  Objective:   Vitals:   05/12/16 0903  BP: 125/77  Pulse: (!) 101  Weight: 235 lb (106.6 kg)    Fetal Status: Fetal Heart Rate (bpm): 145 Fundal Height: 32 cm Movement: Present     General:  Alert, oriented and cooperative. Patient is in no acute distress.  Skin: Skin is warm and dry. No rash noted.   Cardiovascular: Normal heart rate noted  Respiratory: Normal respiratory effort, no problems with respiration noted  Abdomen: Soft, gravid, appropriate for gestational age. Pain/Pressure: Present     Pelvic:  Cervical exam deferred        Extremities: Normal range of motion.     Mental Status: Normal mood and affect. Normal behavior. Normal judgment and thought content.   Urinalysis: Urine Protein: 2+ Urine Glucose: Negative  Assessment and Plan:  Pregnancy: G3P2002 at [redacted]w[redacted]d  1. Supervision of high risk pregnancy, antepartum, third trimester - Reviewed growth ultrasounds  2. Rh negative status during pregnancy, antepartum, third  trimester, fetus 1 - Received Rhophylac at last visit  3. Gestational diabetes mellitus (GDM) controlled on oral hypoglycemic drug, antepartum - Discussed nutrition modification for breakfast - (decreased carbs-cereal/milk, beans, tortillas) - Begin fetal testing next week  Preterm labor symptoms and general obstetric precautions including but not limited to vaginal bleeding, contractions, leaking of fluid and fetal movement were reviewed in detail with the patient. Please refer to After Visit Summary for other counseling recommendations.  Return Monday NST and Thur NST, for NST 2/wk and appt in 2 weeks.   Eino Farber Kennith Gain, CNM

## 2016-05-12 NOTE — Patient Instructions (Signed)
AREA PEDIATRIC/FAMILY PRACTICE PHYSICIANS   [facility] [address], [address] [place], [place]  [address] Phone - [phone]   Fax - [phone]  [facility] [address] [place], [place] [address] Phone - [phone]   Fax - [phone]  [facility] [address] [place], [place]  [address] Phone - [phone]   Fax - [phone]  [facility] [address] [place], [place]  [address] Phone - [phone]   Fax - [phone]  [facility] [address] [place], [place]  [address] Phone - [phone]   Fax - [phone]  [facility] [address] [place], [place]  [address] Phone - [phone]   Fax - [phone]  [facility] [address] [place], [place]  [address] Phone - [phone]   Fax - [phone]  [facility] [address] [place], [place]  [address] Phone - [phone]   Fax - [phone]  [facility] [address] [place], [place]  [address] Phone - [phone]   Fax - [phone] [facility] [address] [place], [place]  [address] Phone - [phone]   Fax - [phone]  [facility] [address] [place], [place]  [address] Phone - [phone]   Fax - [phone]  [facility] [address] [place], [place]  [address] Phone - [phone]   Fax - [phone]  [facility] [address] [place], [place]  [address] Phone - [phone]   Fax - [phone]  [facility] [address] [place], [place]  [address] Phone - [phone]  [facility] [address] [place], [place]  [address] Phone - [phone]   Fax - [phone]  [facility] [address] [place], [place]  [address] Phone - [phone]   Fax - [phone]  [facility] [address] [place], [place]  [address] Phone - [phone]   Fax - [phone]  [facility] [address] [place], [place]  [address] Phone - [phone]   Fax - [phone]   [facility] [address] [place], [place]  [address] Phone - [phone]   Fax - [phone]  [facility] [address] , [place]  [address] Phone - [phone]   Fax - [phone]  [facility] [address] , [place]  [address] Phone - [phone]   Fax - [phone]  [facility] [address] , [place]  [address] Phone - [phone]   Fax - [phone]  [facility] [address] , [place]  [address] Phone - [phone]   Fax - [phone] [facility] [address] [place], [place]  [address] Phone - [phone]   Fax - [phone]  [facility] [address] [place], [place]  [address] Phone - [phone]   Fax - [phone]  [facility] [address] [place], [place]  [address] Phone - [phone]   Fax - [phone]   

## 2016-05-16 ENCOUNTER — Ambulatory Visit (INDEPENDENT_AMBULATORY_CARE_PROVIDER_SITE_OTHER): Payer: Self-pay | Admitting: *Deleted

## 2016-05-16 VITALS — BP 121/77 | HR 94

## 2016-05-16 DIAGNOSIS — O24415 Gestational diabetes mellitus in pregnancy, controlled by oral hypoglycemic drugs: Secondary | ICD-10-CM

## 2016-05-16 NOTE — Progress Notes (Signed)
Reactive NST 

## 2016-05-18 ENCOUNTER — Other Ambulatory Visit: Payer: Self-pay | Admitting: Advanced Practice Midwife

## 2016-05-19 ENCOUNTER — Other Ambulatory Visit: Payer: Self-pay | Admitting: Family Medicine

## 2016-05-20 ENCOUNTER — Encounter (HOSPITAL_COMMUNITY): Payer: Self-pay

## 2016-05-20 ENCOUNTER — Ambulatory Visit (HOSPITAL_COMMUNITY)
Admission: RE | Admit: 2016-05-20 | Discharge: 2016-05-20 | Disposition: A | Discharge status: Home / Self Care | Payer: Self-pay | Source: Ambulatory Visit | Attending: Family Medicine | Admitting: Family Medicine

## 2016-05-20 ENCOUNTER — Ambulatory Visit (INDEPENDENT_AMBULATORY_CARE_PROVIDER_SITE_OTHER): Payer: Self-pay | Admitting: Obstetrics & Gynecology

## 2016-05-20 VITALS — BP 121/74 | HR 80

## 2016-05-20 DIAGNOSIS — O24415 Gestational diabetes mellitus in pregnancy, controlled by oral hypoglycemic drugs: Secondary | ICD-10-CM

## 2016-05-20 DIAGNOSIS — O99113 Other diseases of the blood and blood-forming organs and certain disorders involving the immune mechanism complicating pregnancy, third trimester: Secondary | ICD-10-CM | POA: Insufficient documentation

## 2016-05-20 DIAGNOSIS — O0993 Supervision of high risk pregnancy, unspecified, third trimester: Secondary | ICD-10-CM

## 2016-05-20 DIAGNOSIS — Z6791 Unspecified blood type, Rh negative: Secondary | ICD-10-CM | POA: Insufficient documentation

## 2016-05-20 DIAGNOSIS — Z3A32 32 weeks gestation of pregnancy: Secondary | ICD-10-CM | POA: Insufficient documentation

## 2016-05-20 DIAGNOSIS — O26893 Other specified pregnancy related conditions, third trimester: Secondary | ICD-10-CM | POA: Insufficient documentation

## 2016-05-20 NOTE — Progress Notes (Signed)
NST reviewed and reactive.  [name] Li, M.D., FACOG    

## 2016-05-20 NOTE — Progress Notes (Signed)
Used [employer] Interpreter [phone].

## 2016-05-20 NOTE — Addendum Note (Signed)
Addended byGerome Apley on: 05/20/2016 12:04 PM   Modules accepted: Orders

## 2016-05-23 ENCOUNTER — Ambulatory Visit (INDEPENDENT_AMBULATORY_CARE_PROVIDER_SITE_OTHER): Payer: Self-pay | Admitting: *Deleted

## 2016-05-23 VITALS — BP 95/75 | HR 95

## 2016-05-23 DIAGNOSIS — O24415 Gestational diabetes mellitus in pregnancy, controlled by oral hypoglycemic drugs: Secondary | ICD-10-CM

## 2016-05-23 NOTE — Progress Notes (Signed)
NST reviewed and reactive.  

## 2016-05-26 ENCOUNTER — Ambulatory Visit (INDEPENDENT_AMBULATORY_CARE_PROVIDER_SITE_OTHER): Payer: Self-pay | Admitting: Obstetrics and Gynecology

## 2016-05-26 VITALS — BP 97/82 | HR 89 | Wt 234.7 lb

## 2016-05-26 DIAGNOSIS — Z36 Encounter for antenatal screening of mother: Secondary | ICD-10-CM

## 2016-05-26 DIAGNOSIS — O0993 Supervision of high risk pregnancy, unspecified, third trimester: Secondary | ICD-10-CM

## 2016-05-26 DIAGNOSIS — O24415 Gestational diabetes mellitus in pregnancy, controlled by oral hypoglycemic drugs: Secondary | ICD-10-CM

## 2016-05-26 LAB — POCT URINALYSIS DIP (DEVICE)
BILIRUBIN URINE: NEGATIVE
GLUCOSE, UA: NEGATIVE mg/dL
Ketones, ur: NEGATIVE mg/dL
NITRITE: NEGATIVE
PH: 7 (ref 5.0–8.0)
Protein, ur: 30 mg/dL — AB
Specific Gravity, Urine: 1.015 (ref 1.005–1.030)
Urobilinogen, UA: 0.2 mg/dL (ref 0.0–1.0)

## 2016-05-26 NOTE — Progress Notes (Signed)
Interpreter [employer] present for encounter.  Pt reports feeling pain in legs and difficulty sleeping.

## 2016-05-26 NOTE — Progress Notes (Signed)
Prenatal Visit Note Date: 05/26/2016 [facility]  Subjective:  [name] Li is a 35 y.o. G3P2002 at [redacted]w[redacted]d being seen today for ongoing prenatal care.  She is currently monitored for the following issues for this high-risk pregnancy and has Supervision of high risk pregnancy, antepartum; Gestational diabetes mellitus (GDM), antepartum; Rh negative status during pregnancy, antepartum; and Obesity affecting pregnancy, antepartum on her problem list.  Patient reports no complaints.   Contractions: Irregular. Vag. Bleeding: None.  Movement: Present. Denies leaking of fluid.   The following portions of the patient's history were reviewed and updated as appropriate: allergies, current medications, past family history, past medical history, past social history, past surgical history and problem list. Problem list updated.  Objective:   Vitals:   05/26/16 0926  BP: 97/82  Pulse: 89  Weight: 234 lb 11.2 oz (106.5 kg)    Fetal Status: Fetal Heart Rate (bpm): NST   Movement: Present  Presentation: Vertex  General:  Alert, oriented and cooperative. Patient is in no acute distress.  Skin: Skin is warm and dry. No rash noted.   Cardiovascular: Normal heart rate noted  Respiratory: Normal respiratory effort, no problems with respiration noted  Abdomen: Soft, gravid, appropriate for gestational age. Pain/Pressure: Present     Pelvic:  Cervical exam deferred        Extremities: Normal range of motion.  Edema: None  Mental Status: Normal mood and affect. Normal behavior. Normal judgment and thought content.   Urinalysis:      Assessment and Plan:  Pregnancy: G3P2002 at [redacted]w[redacted]d  1. Gestational diabetes mellitus (GDM) in third trimester controlled on oral hypoglycemic drug rNST and normal AFI today, with LGA and large AC on 8/25. Normal BS log on glyburide 5/7.5. Continue 2x/week testing. 10-14d provider visit  H/o 10 lbs SVD and 8lbs deliveries and no issues.  D/w her will keep eye on labor curve and risk of SD.   2. Supervision of high risk pregnancy, antepartum, third trimester Routine care. Continue baby asa  Preterm labor symptoms and general obstetric precautions including but not limited to vaginal bleeding, contractions, leaking of fluid and fetal movement were reviewed in detail with the patient. Please refer to After Visit Summary for other counseling recommendations.  Return in about 5 days (around 05/31/2016) for 9/5 and 9/8 as scheduled.   [place] Bing, MD

## 2016-05-31 ENCOUNTER — Ambulatory Visit (INDEPENDENT_AMBULATORY_CARE_PROVIDER_SITE_OTHER): Payer: Self-pay | Admitting: Clinical

## 2016-05-31 ENCOUNTER — Ambulatory Visit (INDEPENDENT_AMBULATORY_CARE_PROVIDER_SITE_OTHER): Payer: Self-pay | Admitting: *Deleted

## 2016-05-31 VITALS — BP 123/74 | HR 91

## 2016-05-31 DIAGNOSIS — F4323 Adjustment disorder with mixed anxiety and depressed mood: Secondary | ICD-10-CM

## 2016-05-31 DIAGNOSIS — O24415 Gestational diabetes mellitus in pregnancy, controlled by oral hypoglycemic drugs: Secondary | ICD-10-CM

## 2016-05-31 NOTE — Progress Notes (Signed)
Interpreter Nile Riggs present for encounter.  Pt had many questions regarding possibility of having epidural during labor due to past back problems.  Pt was advised to discuss in detail w/provider @ next visit on 9/15.  Pt states she is feeling very anxious - depression screen completed. [name] Li

## 2016-05-31 NOTE — Progress Notes (Signed)
  ASSESSMENT: Pt currently experiencing Adjustment disorder with mixed anxious and depressed mood. Pt would benefit from psychoeducation and brief therapeutic intervention regarding coping with symptoms of anxiety and depression. Pt may benefit from community resources  Stage of Change: contemplative  PLAN: 1. F/U with behavioral health clinician at next visit on 06-09-16, to practice relaxation breathing exercises 2. Psychiatric Medications: none 3. Behavioral recommendations:   -Pick up another Medicaid application at front desk to reapply for Medicaid -Consider sharing with family the community resources available, as discussed in office visit -Read educational material regarding coping with symptoms of anxiety -Keep a log of any symptoms or questions that come up regarding health, to bring to next OB appointment   SUBJECTIVE: Pt. referred by Diane Day, RN, for patient anxiety Pt. reports the following symptoms/concerns: Pt states that her primary concern is worrying about family and finances, and the unknowns in life.  Duration of problem: Less than one month Severity: mild   OBJECTIVE: Orientation & Cognition: Oriented x3. Thought processes normal and appropriate to situation. Mood: teary Affect: appropriate Appearance: appropriate Risk of harm to self or others: no known risk of harm to self or others Substance use: none Assessments administered: PHQ9: 6/ GAD7: 3  Diagnosis: Adjustment disorder with mixed anxiety and depressed mood CPT Code: F43.23  -------------------------------------------- Other(s) present in the room: Spanish-speaking interpreter  Time spent with patient in exam room: 30 minutes, 12:00-12:30pm  Depression screen [facility] 2[facility]9 05/31/2016 12/30/2015  Decreased Interest 1 0  Down, Depressed, Hopeless 1 0  PHQ - 2 Score 2 0  Altered sleeping 2 -  Tired, decreased energy 1 -  Change in appetite 0 -  Feeling bad or failure about yourself  0 -  Trouble  concentrating 0 -  Moving slowly or fidgety/restless 1 -  Suicidal thoughts 0 -  PHQ-9 Score 6 -  Difficult doing work/chores Somewhat difficult -   GAD 7 : Generalized Anxiety Score 05/31/2016  Nervous, Anxious, on Edge 1  Control/stop worrying 0  Worry too much - different things 1  Trouble relaxing 1  Restless 0  Easily annoyed or irritable 0  Afraid - awful might happen 0  Total GAD 7 Score 3

## 2016-06-02 ENCOUNTER — Ambulatory Visit (INDEPENDENT_AMBULATORY_CARE_PROVIDER_SITE_OTHER): Payer: Self-pay | Admitting: Family

## 2016-06-02 VITALS — BP 112/75 | HR 92 | Wt 237.9 lb

## 2016-06-02 DIAGNOSIS — O360191 Maternal care for anti-D [Rh] antibodies, unspecified trimester, fetus 1: Secondary | ICD-10-CM

## 2016-06-02 DIAGNOSIS — O0993 Supervision of high risk pregnancy, unspecified, third trimester: Secondary | ICD-10-CM

## 2016-06-02 DIAGNOSIS — O360190 Maternal care for anti-D [Rh] antibodies, unspecified trimester, not applicable or unspecified: Secondary | ICD-10-CM

## 2016-06-02 DIAGNOSIS — O24415 Gestational diabetes mellitus in pregnancy, controlled by oral hypoglycemic drugs: Secondary | ICD-10-CM

## 2016-06-02 DIAGNOSIS — Z36 Encounter for antenatal screening of mother: Secondary | ICD-10-CM

## 2016-06-02 LAB — POCT URINALYSIS DIP (DEVICE)
Glucose, UA: NEGATIVE mg/dL
HGB URINE DIPSTICK: NEGATIVE
Nitrite: NEGATIVE
PH: 6.5 (ref 5.0–8.0)
PROTEIN: 100 mg/dL — AB
SPECIFIC GRAVITY, URINE: 1.025 (ref 1.005–1.030)
Urobilinogen, UA: 0.2 mg/dL (ref 0.0–1.0)

## 2016-06-02 NOTE — Progress Notes (Signed)
US for growth scheduled 9/22.  [facility] [place] # [phone] used for encounter.

## 2016-06-02 NOTE — Progress Notes (Signed)
NST reviewed - reactive 

## 2016-06-02 NOTE — Progress Notes (Signed)
Fasting PPB PPL PPD  60-115, 2/9 abnl 99-145, 4/9 abnl 65-154, 2/9 abnl 89-122, 1/5 abnl

## 2016-06-02 NOTE — Progress Notes (Signed)
   PRENATAL VISIT NOTE  Subjective:  [name] Li is a 35 y.o. G3P2002 at [redacted]w[redacted]d being seen today for ongoing prenatal care.  She is currently monitored for the following issues for this high-risk pregnancy and has Supervision of high risk pregnancy, antepartum; Gestational diabetes mellitus (GDM), antepartum; Rh negative status during pregnancy, antepartum; and Obesity affecting pregnancy, antepartum on her problem list.  Patient reports no complaints.  Contractions: Irregular. Vag. Bleeding: None.  Movement: Present. Denies leaking of fluid.   The following portions of the patient's history were reviewed and updated as appropriate: allergies, current medications, past family history, past medical history, past social history, past surgical history and problem list. Problem list updated.  Objective:   Vitals:   06/02/16 1014  BP: 112/75  Pulse: 92  Weight: 237 lb 14.4 oz (107.9 kg)    Fetal Status: Fetal Heart Rate (bpm): NST Fundal Height: 35 cm Movement: Present     General:  Alert, oriented and cooperative. Patient is in no acute distress.  Skin: Skin is warm and dry. No rash noted.   Cardiovascular: Normal heart rate noted  Respiratory: Normal respiratory effort, no problems with respiration noted  Abdomen: Soft, gravid, appropriate for gestational age. Pain/Pressure: Present     Pelvic:  Cervical exam deferred        Extremities: Normal range of motion.  Edema: None  Mental Status: Normal mood and affect. Normal behavior. Normal judgment and thought content.   Urinalysis: Urine Protein: 2+ Urine Glucose: Negative  Assessment and Plan:  Pregnancy: G3P2002 at [redacted]w[redacted]d  1. Gestational diabetes mellitus (GDM) in third trimester controlled on oral hypoglycemic drug - Amniotic fluid index with NST - NST reactive - Encouraged to monitor breakfast consumption - Growth ultrasound scheduled  2. Rh negative status during pregnancy, antepartum, unspecified trimester, fetus  1 - Received in third trimester - Rhophylac  Preterm labor symptoms and general obstetric precautions including but not limited to vaginal bleeding, contractions, leaking of fluid and fetal movement were reviewed in detail with the patient. Please refer to After Visit Summary for other counseling recommendations.  Return in about 5 days (around 06/07/2016) for NST only; Ob and NST/AFI 9/14 as scheduled.  Eino Farber Kennith Gain, CNM

## 2016-06-03 ENCOUNTER — Other Ambulatory Visit: Payer: Self-pay | Admitting: Family Medicine

## 2016-06-07 ENCOUNTER — Ambulatory Visit (INDEPENDENT_AMBULATORY_CARE_PROVIDER_SITE_OTHER): Payer: Self-pay | Admitting: Advanced Practice Midwife

## 2016-06-07 VITALS — BP 119/77 | HR 99

## 2016-06-07 DIAGNOSIS — O24415 Gestational diabetes mellitus in pregnancy, controlled by oral hypoglycemic drugs: Secondary | ICD-10-CM

## 2016-06-07 NOTE — Progress Notes (Signed)
Video interpreter Ricki Rodriguez [phone] used for encounter

## 2016-06-09 ENCOUNTER — Ambulatory Visit (INDEPENDENT_AMBULATORY_CARE_PROVIDER_SITE_OTHER): Payer: Self-pay | Admitting: Obstetrics and Gynecology

## 2016-06-09 VITALS — BP 112/76 | HR 100 | Wt 252.8 lb

## 2016-06-09 DIAGNOSIS — O360190 Maternal care for anti-D [Rh] antibodies, unspecified trimester, not applicable or unspecified: Secondary | ICD-10-CM

## 2016-06-09 DIAGNOSIS — Z36 Encounter for antenatal screening of mother: Secondary | ICD-10-CM

## 2016-06-09 DIAGNOSIS — O24410 Gestational diabetes mellitus in pregnancy, diet controlled: Secondary | ICD-10-CM

## 2016-06-09 DIAGNOSIS — E669 Obesity, unspecified: Secondary | ICD-10-CM

## 2016-06-09 DIAGNOSIS — O24415 Gestational diabetes mellitus in pregnancy, controlled by oral hypoglycemic drugs: Secondary | ICD-10-CM

## 2016-06-09 DIAGNOSIS — O0993 Supervision of high risk pregnancy, unspecified, third trimester: Secondary | ICD-10-CM

## 2016-06-09 DIAGNOSIS — O99213 Obesity complicating pregnancy, third trimester: Secondary | ICD-10-CM

## 2016-06-09 DIAGNOSIS — Z113 Encounter for screening for infections with a predominantly sexual mode of transmission: Secondary | ICD-10-CM

## 2016-06-09 LAB — POCT URINALYSIS DIP (DEVICE)
BILIRUBIN URINE: NEGATIVE
GLUCOSE, UA: NEGATIVE mg/dL
Ketones, ur: NEGATIVE mg/dL
NITRITE: NEGATIVE
Protein, ur: 30 mg/dL — AB
SPECIFIC GRAVITY, URINE: 1.01 (ref 1.005–1.030)
UROBILINOGEN UA: 0.2 mg/dL (ref 0.0–1.0)
pH: 6 (ref 5.0–8.0)

## 2016-06-09 LAB — OB RESULTS CONSOLE GC/CHLAMYDIA: Gonorrhea: NEGATIVE

## 2016-06-09 LAB — OB RESULTS CONSOLE GBS: GBS: POSITIVE

## 2016-06-09 NOTE — Progress Notes (Signed)
[place] for growth scheduled on 9/22

## 2016-06-09 NOTE — Addendum Note (Signed)
Addended by: Kathee DeltonHILLMAN, Telia Amundson L on: 06/09/2016 11:53 AM   Modules accepted: Orders

## 2016-06-09 NOTE — Progress Notes (Signed)
   PRENATAL VISIT NOTE  Subjective:  [name] Li is a 35 y.o. G3P2002 at [redacted]w[redacted]d being seen today for ongoing prenatal care.  She is currently monitored for the following issues for this high-risk pregnancy and has Supervision of high risk pregnancy, antepartum; Gestational diabetes mellitus (GDM), antepartum; Rh negative status during pregnancy, antepartum; and Obesity affecting pregnancy, antepartum on her problem list.  Patient reports no complaints.  Contractions: Not present. Vag. Bleeding: None.  Movement: Present. Denies leaking of fluid.   The following portions of the patient's history were reviewed and updated as appropriate: allergies, current medications, past family history, past medical history, past social history, past surgical history and problem list. Problem list updated.  Objective:   Vitals:   06/09/16 1028  BP: 112/76  Pulse: 100  Weight: 252 lb 12.8 oz (114.7 kg)    Fetal Status: Fetal Heart Rate (bpm): NST   Movement: Present  Presentation: Vertex  General:  Alert, oriented and cooperative. Patient is in no acute distress.  Skin: Skin is warm and dry. No rash noted.   Cardiovascular: Normal heart rate noted  Respiratory: Normal respiratory effort, no problems with respiration noted  Abdomen: Soft, gravid, appropriate for gestational age. Pain/Pressure: Present     Pelvic:  Cervical exam performed Dilation: Closed Effacement (%): Thick Station: Ballotable  Extremities: Normal range of motion.  Edema: Mild pitting, slight indentation  Mental Status: Normal mood and affect. Normal behavior. Normal judgment and thought content.   Urinalysis:      Assessment and Plan:  Pregnancy: G3P2002 at [redacted]w[redacted]d  1. Supervision of high risk pregnancy, antepartum, third trimester Patient is doing well without complaints Cultures collected today - Culture, beta strep (group b only) - GC/Chlamydia probe amp ([place])not at [facility]  2. Rh negative status during  pregnancy, antepartum, unspecified trimester, not applicable or unspecified fetus S/p rhogam  3. Obesity affecting pregnancy, antepartum, third trimester   4. Gestational diabetes mellitus (GDM) in third trimester controlled on oral hypoglycemic drug CBGs reviewed and great majority within range a few elevated pp 135 and 163. Patient admits to deviating from the diet.  Continue current glyburide dosing - Amniotic fluid index with NST  Preterm labor symptoms and general obstetric precautions including but not limited to vaginal bleeding, contractions, leaking of fluid and fetal movement were reviewed in detail with the patient. Please refer to After Visit Summary for other counseling recommendations.  Return in about 5 days (around 06/14/2016) for 2x/wk as scheduled.  Catalina Antigua, MD

## 2016-06-10 LAB — GC/CHLAMYDIA PROBE AMP (~~LOC~~) NOT AT ARMC
CHLAMYDIA, DNA PROBE: NEGATIVE
NEISSERIA GONORRHEA: NEGATIVE

## 2016-06-11 LAB — CULTURE, BETA STREP (GROUP B ONLY)

## 2016-06-14 ENCOUNTER — Ambulatory Visit (INDEPENDENT_AMBULATORY_CARE_PROVIDER_SITE_OTHER): Payer: Self-pay | Admitting: Certified Nurse Midwife

## 2016-06-14 VITALS — BP 124/77 | HR 94

## 2016-06-14 DIAGNOSIS — O24415 Gestational diabetes mellitus in pregnancy, controlled by oral hypoglycemic drugs: Secondary | ICD-10-CM

## 2016-06-14 NOTE — Progress Notes (Signed)
NST reactive. AFI 11.6 cm. Continue scheduled surveillance.

## 2016-06-16 NOTE — Progress Notes (Signed)
   PRENATAL VISIT NOTE  Subjective:  [name] Li is a 35 y.o. G3P2002 at [redacted]w[redacted]d being seen today for ongoing prenatal care.  She is currently monitored for the following issues for this high-risk pregnancy and has Supervision of high risk pregnancy, antepartum; Gestational diabetes mellitus (GDM), antepartum; Rh negative status during pregnancy, antepartum; and Obesity affecting pregnancy, antepartum on her problem list.  Patient reports no complaints.   .  .  Movement: Present. Denies leaking of fluid.   The following portions of the patient's history were reviewed and updated as appropriate: allergies, current medications, past family history, past medical history, past social history, past surgical history and problem list. Problem list updated.  Objective:   Vitals:   06/07/16 1036  BP: 119/77  Pulse: 99    Fetal Status:     Movement: Present     General:  Alert, oriented and cooperative. Patient is in no acute distress.  Skin: Skin is warm and dry. No rash noted.   Cardiovascular: Normal heart rate noted  Respiratory: Normal respiratory effort, no problems with respiration noted  Abdomen: Soft, gravid, appropriate for gestational age.       Pelvic:  Cervical exam deferred        Extremities: Normal range of motion.     Mental Status: Normal mood and affect. Normal behavior. Normal judgment and thought content.   Urinalysis:      Assessment and Plan:  Pregnancy: G3P2002 at [redacted]w[redacted]d  1. Gestational diabetes mellitus (GDM) in third trimester controlled on oral hypoglycemic drug --Reviewed CBGs and most wnl with 1-2 elevated 130s/140s.  Discussed dietary choices.  Continue Glyburide as prescribed. - Fetal nonstress test is reactive today.  Preterm labor symptoms and general obstetric precautions including but not limited to vaginal bleeding, contractions, leaking of fluid and fetal movement were reviewed in detail with the patient. Please refer to After Visit Summary  for other counseling recommendations.  Return in about 6 days (around 06/13/2016) for 9/18 or 9/19 NST only.  Hurshel Party, CNM

## 2016-06-17 ENCOUNTER — Ambulatory Visit (HOSPITAL_COMMUNITY)
Admission: RE | Admit: 2016-06-17 | Discharge: 2016-06-17 | Disposition: A | Discharge status: Home / Self Care | Payer: Self-pay | Source: Ambulatory Visit | Attending: Family Medicine | Admitting: Family Medicine

## 2016-06-17 ENCOUNTER — Other Ambulatory Visit (HOSPITAL_COMMUNITY): Payer: Self-pay | Admitting: *Deleted

## 2016-06-17 ENCOUNTER — Other Ambulatory Visit (HOSPITAL_COMMUNITY): Payer: Self-pay | Admitting: Maternal and Fetal Medicine

## 2016-06-17 ENCOUNTER — Encounter (HOSPITAL_COMMUNITY): Payer: Self-pay

## 2016-06-17 ENCOUNTER — Ambulatory Visit (INDEPENDENT_AMBULATORY_CARE_PROVIDER_SITE_OTHER): Payer: Self-pay | Admitting: Family Medicine

## 2016-06-17 VITALS — BP 121/75 | HR 96 | Wt 240.7 lb

## 2016-06-17 DIAGNOSIS — O24415 Gestational diabetes mellitus in pregnancy, controlled by oral hypoglycemic drugs: Secondary | ICD-10-CM

## 2016-06-17 DIAGNOSIS — O99820 Streptococcus B carrier state complicating pregnancy: Secondary | ICD-10-CM

## 2016-06-17 DIAGNOSIS — O360130 Maternal care for anti-D [Rh] antibodies, third trimester, not applicable or unspecified: Secondary | ICD-10-CM

## 2016-06-17 DIAGNOSIS — O26893 Other specified pregnancy related conditions, third trimester: Secondary | ICD-10-CM | POA: Insufficient documentation

## 2016-06-17 DIAGNOSIS — Z3A36 36 weeks gestation of pregnancy: Secondary | ICD-10-CM

## 2016-06-17 DIAGNOSIS — Z22330 Carrier of Group B streptococcus: Secondary | ICD-10-CM

## 2016-06-17 DIAGNOSIS — O99213 Obesity complicating pregnancy, third trimester: Secondary | ICD-10-CM

## 2016-06-17 DIAGNOSIS — Z6791 Unspecified blood type, Rh negative: Secondary | ICD-10-CM | POA: Insufficient documentation

## 2016-06-17 DIAGNOSIS — E669 Obesity, unspecified: Secondary | ICD-10-CM | POA: Insufficient documentation

## 2016-06-17 DIAGNOSIS — O0993 Supervision of high risk pregnancy, unspecified, third trimester: Secondary | ICD-10-CM

## 2016-06-17 HISTORY — DX: Streptococcus B carrier state complicating pregnancy: O99.820

## 2016-06-17 LAB — POCT URINALYSIS DIP (DEVICE)
Bilirubin Urine: NEGATIVE
Glucose, UA: NEGATIVE mg/dL
Ketones, ur: NEGATIVE mg/dL
NITRITE: NEGATIVE
PH: 7 (ref 5.0–8.0)
Protein, ur: 100 mg/dL — AB
SPECIFIC GRAVITY, URINE: 1.02 (ref 1.005–1.030)
UROBILINOGEN UA: 0.2 mg/dL (ref 0.0–1.0)

## 2016-06-17 NOTE — Progress Notes (Signed)
Subjective:  [name] Li is a 35 y.o. G3P2002 at 6788w6d being seen today for ongoing prenatal care.  She is currently monitored for the following issues for this high-risk pregnancy and has Supervision of high risk pregnancy, antepartum; Gestational diabetes mellitus (GDM), antepartum; Rh negative status during pregnancy, antepartum; and Obesity affecting pregnancy, antepartum on her problem list.  GDM: Patient taking Glyburide 5mg  in AM and 7.5 in PM.  Reports occasional hypoglycemic episodes.  Tolerating medication well Fasting: 64-110 (2 elevated) 2hr PP: 89-151 (4 elevated) missed many PP after dinner  Patient reports no complaints.  Contractions: Not present. Vag. Bleeding: None.  Movement: Present. Denies leaking of fluid.   The following portions of the patient's history were reviewed and updated as appropriate: allergies, current medications, past family history, past medical history, past social history, past surgical history and problem list. Problem list updated.  Objective:   Vitals:   06/17/16 1000  BP: 121/75  Pulse: 96  Weight: 240 lb 11.2 oz (109.2 kg)    Fetal Status: Fetal Heart Rate (bpm): NST   Movement: Present     General:  Alert, oriented and cooperative. Patient is in no acute distress.  Skin: Skin is warm and dry. No rash noted.   Cardiovascular: Normal heart rate noted  Respiratory: Normal respiratory effort, no problems with respiration noted  Abdomen: Soft, gravid, appropriate for gestational age. Pain/Pressure: Present     Pelvic: Vag. Bleeding: None     Cervical exam deferred        Extremities: Normal range of motion.  Edema: Mild pitting, slight indentation  Mental Status: Normal mood and affect. Normal behavior. Normal judgment and thought content.   Urinalysis:      Assessment and Plan:  Pregnancy: G3P2002 at 8488w6d  1. Gestational diabetes mellitus (GDM) in third trimester controlled on oral hypoglycemic drug Continue glyburide,  ASA. Has US for growth today. NST -  - Fetal nonstress test  2. Supervision of high risk pregnancy, antepartum, third trimester  3.  GBS carrier Intrapartum treatment.  Term labor symptoms and general obstetric precautions including but not limited to vaginal bleeding, contractions, leaking of fluid and fetal movement were reviewed in detail with the patient. Please refer to After Visit Summary for other counseling recommendations.  Return in about 1 week (around 06/24/2016) for Ob fu and NST/AFI.   Levie HeritageJacob J Guiselle Mian, DO

## 2016-06-17 NOTE — Progress Notes (Signed)
Video interpreter # [phone] used for encounter today.  US for growth today

## 2016-06-21 ENCOUNTER — Ambulatory Visit (INDEPENDENT_AMBULATORY_CARE_PROVIDER_SITE_OTHER): Payer: Self-pay | Admitting: Advanced Practice Midwife

## 2016-06-21 VITALS — BP 126/83 | HR 87

## 2016-06-21 DIAGNOSIS — O24415 Gestational diabetes mellitus in pregnancy, controlled by oral hypoglycemic drugs: Secondary | ICD-10-CM

## 2016-06-21 NOTE — Progress Notes (Signed)
NST reactive.

## 2016-06-24 ENCOUNTER — Ambulatory Visit (INDEPENDENT_AMBULATORY_CARE_PROVIDER_SITE_OTHER): Payer: Self-pay | Admitting: Obstetrics and Gynecology

## 2016-06-24 VITALS — BP 122/83 | HR 90 | Wt 243.4 lb

## 2016-06-24 DIAGNOSIS — Z22330 Carrier of Group B streptococcus: Secondary | ICD-10-CM

## 2016-06-24 DIAGNOSIS — E669 Obesity, unspecified: Secondary | ICD-10-CM

## 2016-06-24 DIAGNOSIS — O24415 Gestational diabetes mellitus in pregnancy, controlled by oral hypoglycemic drugs: Secondary | ICD-10-CM

## 2016-06-24 DIAGNOSIS — O0993 Supervision of high risk pregnancy, unspecified, third trimester: Secondary | ICD-10-CM

## 2016-06-24 DIAGNOSIS — Z36 Encounter for antenatal screening of mother: Secondary | ICD-10-CM

## 2016-06-24 DIAGNOSIS — O99213 Obesity complicating pregnancy, third trimester: Secondary | ICD-10-CM

## 2016-06-24 DIAGNOSIS — O99820 Streptococcus B carrier state complicating pregnancy: Secondary | ICD-10-CM

## 2016-06-24 DIAGNOSIS — O360130 Maternal care for anti-D [Rh] antibodies, third trimester, not applicable or unspecified: Secondary | ICD-10-CM

## 2016-06-24 NOTE — Progress Notes (Signed)
IOL scheduled 10/8 @ 0730

## 2016-06-24 NOTE — Progress Notes (Signed)
   PRENATAL VISIT NOTE  Subjective:  [name] PetrinSandra Enriquez-Castro is a 35 y.o. G3P2002 at 1555w6d being seen today for ongoing prenatal care.  She is currently monitored for the following issues for this high-risk pregnancy and has Supervision of high risk pregnancy, antepartum; Gestational diabetes mellitus (GDM), antepartum; Rh negative status during pregnancy, antepartum; Obesity affecting pregnancy, antepartum; and GBS (group B Streptococcus carrier), +RV culture, currently pregnant on her problem list.  Patient reports no complaints.  Contractions: Irregular. Vag. Bleeding: None.  Movement: Present. Denies leaking of fluid.   The following portions of the patient's history were reviewed and updated as appropriate: allergies, current medications, past family history, past medical history, past social history, past surgical history and problem list. Problem list updated.  Objective:   Vitals:   06/24/16 0957  BP: 122/83  Pulse: 90  Weight: 243 lb 6.4 oz (110.4 kg)    Fetal Status: Fetal Heart Rate (bpm): NST   Movement: Present     General:  Alert, oriented and cooperative. Patient is in no acute distress.  Skin: Skin is warm and dry. No rash noted.   Cardiovascular: Normal heart rate noted  Respiratory: Normal respiratory effort, no problems with respiration noted  Abdomen: Soft, gravid, appropriate for gestational age. Pain/Pressure: Present     Pelvic:  Cervical exam deferred        Extremities: Normal range of motion.  Edema: Mild pitting, slight indentation  Mental Status: Normal mood and affect. Normal behavior. Normal judgment and thought content.   Urinalysis: Urine Protein: 2+ Urine Glucose: Negative  Assessment and Plan:  Pregnancy: G3P2002 at 3955w6d  1. Supervision of high risk pregnancy, antepartum, third trimester Patient is doing well without complaints  2. Rh negative status during pregnancy, antepartum, third trimester, not applicable or unspecified fetus S/p  rhogam  3. Obesity affecting pregnancy, antepartum, third trimester   4. Gestational diabetes mellitus (GDM) controlled on oral hypoglycemic drug, antepartum CBGs reviewed and all but one within range. 1 p breakfast value of 211. Patient ate sweet bread with chocolate spread Continue glyburide Patient will be scheduled for IOL at 39 weeks Reviewed 9/22 ultrasound results. Discussed risks of shoulder dystocia and protracted labor. Patient has had a 10lb baby vaginally - Amniotic fluid index with NST- reviewed and reactive  5. GBS (group B Streptococcus carrier), +RV culture, currently pregnant Will need prophylaxis in labor  Term labor symptoms and general obstetric precautions including but not limited to vaginal bleeding, contractions, leaking of fluid and fetal movement were reviewed in detail with the patient. Please refer to After Visit Summary for other counseling recommendations.  Return in about 4 days (around 06/28/2016) for NST.  Catalina AntiguaPeggy Mataio Mele, MD

## 2016-06-27 LAB — POCT URINALYSIS DIP (DEVICE)
GLUCOSE, UA: NEGATIVE mg/dL
Hgb urine dipstick: NEGATIVE
KETONES UR: 40 mg/dL — AB
Nitrite: NEGATIVE
Protein, ur: 100 mg/dL — AB
SPECIFIC GRAVITY, URINE: 1.025 (ref 1.005–1.030)
Urobilinogen, UA: 1 mg/dL (ref 0.0–1.0)
pH: 6.5 (ref 5.0–8.0)

## 2016-06-28 ENCOUNTER — Ambulatory Visit: Payer: Self-pay | Admitting: Advanced Practice Midwife

## 2016-06-28 ENCOUNTER — Telehealth (HOSPITAL_COMMUNITY): Payer: Self-pay | Admitting: *Deleted

## 2016-06-28 ENCOUNTER — Encounter (HOSPITAL_COMMUNITY): Payer: Self-pay | Admitting: *Deleted

## 2016-06-28 VITALS — BP 123/84 | HR 88

## 2016-06-28 DIAGNOSIS — O24415 Gestational diabetes mellitus in pregnancy, controlled by oral hypoglycemic drugs: Secondary | ICD-10-CM

## 2016-06-28 NOTE — Progress Notes (Deleted)
   PRENATAL VISIT NOTE  Subjective:  [name] Li is a 35 y.o. G3P2002 at [redacted]w[redacted]d being seen today for ongoing prenatal care.  She is currently monitored for the following issues for this high-risk pregnancy and has Supervision of high risk pregnancy, antepartum; Gestational diabetes mellitus (GDM), antepartum; Rh negative status during pregnancy, antepartum; Obesity affecting pregnancy, antepartum; and GBS (group B Streptococcus carrier), +RV culture, currently pregnant on her problem list.  Patient reports no complaints.   .  .  Movement: Present. Denies leaking of fluid.   The following portions of the patient's history were reviewed and updated as appropriate: allergies, current medications, past family history, past medical history, past social history, past surgical history and problem list. Problem list updated.  Objective:   Vitals:   06/28/16 1034  BP: 123/84  Pulse: 88    Fetal Status:     Movement: Present     General:  Alert, oriented and cooperative. Patient is in no acute distress.  Skin: Skin is warm and dry. No rash noted.   Cardiovascular: Normal heart rate noted  Respiratory: Normal respiratory effort, no problems with respiration noted  Abdomen: Soft, gravid, appropriate for gestational age.       Pelvic:  Cervical exam deferred        Extremities: Normal range of motion.     Mental Status: Normal mood and affect. Normal behavior. Normal judgment and thought content.   Urinalysis:      Assessment and Plan:  Pregnancy: G3P2002 at [redacted]w[redacted]d  1. Gestational diabetes mellitus (GDM) in third trimester controlled on oral hypoglycemic drug  - Fetal nonstress test reactive  Term labor symptoms and general obstetric precautions including but not limited to vaginal bleeding, contractions, leaking of fluid and fetal movement were reviewed in detail with the patient. Please refer to After Visit Summary for other counseling recommendations.  Return in about 2 days  (around 06/30/2016) for as scheduled.  Hurshel Party, CNM

## 2016-06-28 NOTE — Telephone Encounter (Signed)
Preadmission screen Interpreter number [phone]

## 2016-06-30 ENCOUNTER — Ambulatory Visit (INDEPENDENT_AMBULATORY_CARE_PROVIDER_SITE_OTHER): Payer: Self-pay | Admitting: Obstetrics and Gynecology

## 2016-06-30 VITALS — BP 122/73 | HR 89 | Wt 245.7 lb

## 2016-06-30 DIAGNOSIS — O26899 Other specified pregnancy related conditions, unspecified trimester: Secondary | ICD-10-CM

## 2016-06-30 DIAGNOSIS — O0993 Supervision of high risk pregnancy, unspecified, third trimester: Secondary | ICD-10-CM

## 2016-06-30 DIAGNOSIS — Z6791 Unspecified blood type, Rh negative: Secondary | ICD-10-CM

## 2016-06-30 DIAGNOSIS — Z3689 Encounter for other specified antenatal screening: Secondary | ICD-10-CM

## 2016-06-30 DIAGNOSIS — O24415 Gestational diabetes mellitus in pregnancy, controlled by oral hypoglycemic drugs: Secondary | ICD-10-CM

## 2016-06-30 DIAGNOSIS — O99820 Streptococcus B carrier state complicating pregnancy: Secondary | ICD-10-CM

## 2016-06-30 LAB — POCT URINALYSIS DIP (DEVICE)
GLUCOSE, UA: NEGATIVE mg/dL
Ketones, ur: NEGATIVE mg/dL
NITRITE: NEGATIVE
Protein, ur: 30 mg/dL — AB
Specific Gravity, Urine: 1.025 (ref 1.005–1.030)
UROBILINOGEN UA: 1 mg/dL (ref 0.0–1.0)
pH: 7 (ref 5.0–8.0)

## 2016-06-30 NOTE — Patient Instructions (Signed)
Parto vaginal (Vaginal Delivery) Durante el parto, el mdico la ayudar a dar a luz a su beb. En elparto vaginal, deber pujar para que el beb salga por la vagina. Sin embargo, antes de que pueda sacar al beb, es necesario que ocurran ciertas cosas. La abertura del tero (cuello del tero) tiene que ablandarse, hacerse ms delgado y abrirse (dilatar) hasta que llegue a 10 cm. Adems, el beb tiene que bajar desde el tero a la vagina. SIGNOS DE TRABAJO DE PARTO  El mdico tendr primero que asegurarse de que usted est en trabajo de parto. Algunos signos son:   Eliminar lo que se llama tapn mucoso antes del inicio del trabajo de parto. Este es una pequea cantidad de mucosidad teida con sangre.  Tener contracciones uterinas regulares y dolorosas.   El tiempo entre las contracciones debe acortarse  Las molestias y el dolor se harn ms intensos gradualmente.  El dolor de las contracciones empeora al caminar y no se alivia con el reposo.   El cuello del tero se hace mas delgado (se borra) y se dilata. ANTES DEL PARTO Una vez que se inicie el trabajo de parto y sea admitida en el hospital o sanatorio, el mdico podr hacer lo siguiente:   Realizar un examen fsico.  Controlar si hay complicaciones relacionadas con el trabajo de parto.  Verificar su presin arterial, temperatura y pulso y la frecuencia cardaca (signos vitales).   Determinar si se ha roto el saco amnitico y cundo ha ocurrido.  Realizar un examen vaginal (utilizando un guante estril y un lubricante) para determinar:  La posicin (presentacin) del beb. El beb se presenta con la cabeza primero (vertex) en el canal de parto (vagina), o estn los pies o las nalgas primero (de nalgas)?  El nivel (estacin) de la cabeza del beb dentro del canal de parto.  El borramiento y la dilatacin del cuello uterino  El monitor fetal electrnico generalmente se coloca sobre el abdomen al llegar. Se utiliza para  controlar las contracciones y la frecuencia cardaca del beb.  Cuando el monitor est en el abdomen (monitor fetal externo), slo toma la frecuencia y la duracin de las contracciones. No informa acerca de la intensidad de las contracciones.  Si el mdico necesita saber exactamente la intensidad de las contracciones o cul es la frecuencia cardaca del beb, colocar un monitor interno en la vagina y el tero. El mdico comentar los riesgos y los beneficios de usar un monitor interno y le pedir autorizacin antes de colocar el dispositivo.  El monitoreo fetal continuo ser necesario si le han aplicado una epidural, si le administran ciertos medicamentos (como oxitocina) y si tiene complicaciones del embarazo o del trabajo de parto.  Podrn colocarle una va intravenosa en una vena del brazo para suministrarle lquidos y medicamentos, si es necesario. TRES ETAPAS DEL TRABAJO DE PARTO Y EL PARTO El trabajo de parto y el parto normales se dividen en tres etapas. Primera etapa Esta etapa comienza cuando comienzan las contracciones regulares y el cuello comienza a borrarse y dilatarse. Finaliza cuando el cuello est completamente abierto (completamente dilatado). La primera etapa es la etapa ms larga del trabajo de parto y puede durar desde 3 horas a 15 horas.  Algunos mtodos estn disponibles para ayudar con el dolor del parto. Usted y su mdico decidirn qu opcin es la mejor para usted. Las opciones incluyen:   Medicamentos narcticos. Estos son medicamentos fuertes que usted puede recibir a travs de una va intravenosa o   como inyeccin en el msculo. Estos medicamentos alivian el dolor pero no hacen que desaparezca completamente.  Epidural. Se administra un medicamento a travs de un tubo delgado que se inserta en la espalda. El medicamento adormece la parte inferior del cuerpo y evita el dolor en esa zona.  Bloqueo paracervical Es una inyeccin de un anestsico en cada lado del cuello  uterino.  Usted podr pedir un parto natural, que implica que no se usen analgsicos ni epidural durante el parto y el trabajo de parto. En cambio, podr tener otro tipo de ayuda como ejercicios respiratorios para hacer frente al dolor. Segunda etapa La segunda etapa del trabajo de parto comienza cuando el cuello se ha dilatado completamente a 10 cm. Contina hasta que usted puja al beb hacia abajo, por el canal de parto, y el beb nace. Esta etapa puede durar slo algunos minutos o algunas horas.  La posicin del la cabeza del beb a medida que pasa por el canal de parto, es informada como un nmero, llamado estacin. Si la cabeza del beb no ha iniciado su descenso, la estacin se describe como que est en menos 3 (-3). Cuando la cabeza del beb est en la estacin cero, est en el medio del canal de parto y se encaja en la pelvis. La estacin en la que se encuentra el beb indica el progreso de la segunda etapa del trabajo de parto.  Cuando el beb nace, el mdico lo sostendr con la cabeza hacia abajo para evitar que el lquido amnitico, el moco y la sangre entren en los pulmones del beb. La boca y la nariz del beb podrn ser succionadas con un pequeo bulbo para retirar todo lquido adicional.  El mdico podr colocar al beb sobre su estmago. Es importante evitar que el beb tome fro. Para hacerlo, el mdico secar al beb, lo colocar directamente sobre su piel, (sin mantas entre usted y el beb) y lo cubrir con mantas secas y tibias.  Se corta el cordn umbilical. Tercera etapa Durante la tercera etapa del trabajo de parto, el mdico sacar la placenta (alumbramiento) y se asegurar de que el sangrado est controlado. La salida de la placenta generalmente demora 5 minutos pero puede tardar hasta 30 minutos. Luego de la salida de la placenta, le darn un medicamento por va intravenosa o inyectable para ayudar a contraer el tero y controlar el sangrado. Si planea amamantar al beb,  puede intentar en este momento Luego de la salida de la placenta, el tero debe contraerse y quedar muy firme. Si el tero no queda firme, el mdico lo masajear. Esto es importante debido a que la contraccin del tero ayuda a cortar el sangrado en el sitio en que la placenta estaba unida al tero. Si el tero no se contrae adecuadamente ni permanece firme, podr causar un sangrado abundante. Si hay mucho sangrado, podrn darle medicamentos para contraer el tero y detener el sangrado.    Esta informacin no tiene como fin reemplazar el consejo del mdico. Asegrese de hacerle al mdico cualquier pregunta que tenga.   Document Released: 08/25/2008 Document Revised: 10/03/2014 Elsevier Interactive Patient Education 2016 [employer]  

## 2016-06-30 NOTE — Progress Notes (Signed)
Subjective:  [name] Li is a 35 y.o. G3P2002 at [redacted]w[redacted]d being seen today for ongoing prenatal care.  She is currently monitored for the following issues for this high-risk pregnancy and has Supervision of high risk pregnancy, antepartum; Gestational diabetes mellitus (GDM), antepartum; Rh negative, antepartum; Obesity affecting pregnancy, antepartum; and GBS (group B Streptococcus carrier), +RV culture, currently pregnant on her problem list.  Patient reports no complaints.  Contractions: Not present. Vag. Bleeding: None.  Movement: Present. Denies leaking of fluid.   The following portions of the patient's history were reviewed and updated as appropriate: allergies, current medications, past family history, past medical history, past social history, past surgical history and problem list. Problem list updated.  Objective:   Vitals:   06/30/16 1004  BP: 122/73  Pulse: 89  Weight: 245 lb 11.2 oz (111.4 kg)    Fetal Status: Fetal Heart Rate (bpm): NST   Movement: Present     General:  Alert, oriented and cooperative. Patient is in no acute distress.  Skin: Skin is warm and dry. No rash noted.   Cardiovascular: Normal heart rate noted  Respiratory: Normal respiratory effort, no problems with respiration noted  Abdomen: Soft, gravid, appropriate for gestational age. Pain/Pressure: Present     Pelvic:  Cervical exam deferred        Extremities: Normal range of motion.  Edema: Mild pitting, slight indentation  Mental Status: Normal mood and affect. Normal behavior. Normal judgment and thought content.   Urinalysis: Urine Protein: 1+ Urine Glucose: Negative  Assessment and Plan:  Pregnancy: G3P2002 at [redacted]w[redacted]d  1. Gestational diabetes mellitus (GDM) in third trimester controlled on oral hypoglycemic drug  - Amniotic fluid index with NST IOL for 07/03/16 2. Supervision of high risk pregnancy, antepartum, third trimester   3. GBS (group B Streptococcus carrier), +RV culture,  currently pregnant Will treat in labor  4. Rh negative, antepartum Rhogam post partum as indicated  Term labor symptoms and general obstetric precautions including but not limited to vaginal bleeding, contractions, leaking of fluid and fetal movement were reviewed in detail with the patient. Please refer to After Visit Summary for other counseling recommendations.  Return in about 6 weeks (around 08/11/2016) for PP visit.  IOL on 10/8.   Hermina Staggers, MD

## 2016-06-30 NOTE — Progress Notes (Signed)
IOL scheduled 10/8

## 2016-07-03 ENCOUNTER — Inpatient Hospital Stay (HOSPITAL_COMMUNITY): Payer: Medicaid Other | Admitting: Anesthesiology

## 2016-07-03 ENCOUNTER — Inpatient Hospital Stay (HOSPITAL_COMMUNITY)
Admission: RE | Admit: 2016-07-03 | Payer: Self-pay | Source: Ambulatory Visit | Attending: Obstetrics and Gynecology | Admitting: Obstetrics and Gynecology

## 2016-07-03 ENCOUNTER — Encounter (HOSPITAL_COMMUNITY): Payer: Self-pay | Admitting: *Deleted

## 2016-07-03 ENCOUNTER — Inpatient Hospital Stay (HOSPITAL_COMMUNITY)
Admission: AD | Admit: 2016-07-03 | Discharge: 2016-07-07 | DRG: 767 | Disposition: A | Discharge status: Home / Self Care | Payer: Medicaid Other | Source: Ambulatory Visit | Attending: Obstetrics & Gynecology | Admitting: Obstetrics & Gynecology

## 2016-07-03 DIAGNOSIS — O99210 Obesity complicating pregnancy, unspecified trimester: Secondary | ICD-10-CM

## 2016-07-03 DIAGNOSIS — O99824 Streptococcus B carrier state complicating childbirth: Secondary | ICD-10-CM | POA: Diagnosis not present

## 2016-07-03 DIAGNOSIS — D62 Acute posthemorrhagic anemia: Secondary | ICD-10-CM | POA: Diagnosis not present

## 2016-07-03 DIAGNOSIS — Z833 Family history of diabetes mellitus: Secondary | ICD-10-CM | POA: Diagnosis not present

## 2016-07-03 DIAGNOSIS — Z6791 Unspecified blood type, Rh negative: Secondary | ICD-10-CM

## 2016-07-03 DIAGNOSIS — O24425 Gestational diabetes mellitus in childbirth, controlled by oral hypoglycemic drugs: Principal | ICD-10-CM | POA: Diagnosis present

## 2016-07-03 DIAGNOSIS — O24415 Gestational diabetes mellitus in pregnancy, controlled by oral hypoglycemic drugs: Secondary | ICD-10-CM

## 2016-07-03 DIAGNOSIS — Z8249 Family history of ischemic heart disease and other diseases of the circulatory system: Secondary | ICD-10-CM | POA: Diagnosis not present

## 2016-07-03 DIAGNOSIS — D649 Anemia, unspecified: Secondary | ICD-10-CM

## 2016-07-03 DIAGNOSIS — O26899 Other specified pregnancy related conditions, unspecified trimester: Secondary | ICD-10-CM

## 2016-07-03 DIAGNOSIS — O9081 Anemia of the puerperium: Secondary | ICD-10-CM | POA: Diagnosis not present

## 2016-07-03 DIAGNOSIS — Z3A39 39 weeks gestation of pregnancy: Secondary | ICD-10-CM

## 2016-07-03 DIAGNOSIS — O0990 Supervision of high risk pregnancy, unspecified, unspecified trimester: Secondary | ICD-10-CM

## 2016-07-03 LAB — CBC
HCT: 36 % (ref 36.0–46.0)
HCT: 36.2 % (ref 36.0–46.0)
HEMOGLOBIN: 12.3 g/dL (ref 12.0–15.0)
Hemoglobin: 12.2 g/dL (ref 12.0–15.0)
MCH: 28.4 pg (ref 26.0–34.0)
MCH: 28.1 pg (ref 26.0–34.0)
MCHC: 33.9 g/dL (ref 30.0–36.0)
MCHC: 34 g/dL (ref 30.0–36.0)
MCV: 83.9 fL (ref 78.0–100.0)
MCV: 82.8 fL (ref 78.0–100.0)
PLATELETS: 130 10*3/uL — AB (ref 150–400)
PLATELETS: 127 10*3/uL — AB (ref 150–400)
RBC: 4.29 MIL/uL (ref 3.87–5.11)
RBC: 4.37 MIL/uL (ref 3.87–5.11)
RDW: 15.7 % — AB (ref 11.5–15.5)
RDW: 15.6 % — AB (ref 11.5–15.5)
WBC: 6.2 10*3/uL (ref 4.0–10.5)
WBC: 7.9 10*3/uL (ref 4.0–10.5)

## 2016-07-03 LAB — GLUCOSE, CAPILLARY
Glucose-Capillary: 90 mg/dL (ref 65–99)
Glucose-Capillary: 89 mg/dL (ref 65–99)
Glucose-Capillary: 84 mg/dL (ref 65–99)
Glucose-Capillary: 104 mg/dL — ABNORMAL HIGH (ref 65–99)

## 2016-07-03 LAB — RPR: RPR: NONREACTIVE

## 2016-07-03 MED ORDER — LACTATED RINGERS IV SOLN: 500.0000 mL | INTRAVENOUS | Status: DC | PRN

## 2016-07-03 MED ORDER — TERBUTALINE SULFATE 1 MG/ML IJ SOLN: 0.2500 mg | Freq: Once | INTRAMUSCULAR | Status: DC | PRN

## 2016-07-03 MED ORDER — FENTANYL 2.5 MCG/ML BUPIVACAINE 1/10 % EPIDURAL INFUSION (WH - ANES)
14.0000 mL/h | INTRAMUSCULAR | Status: DC | PRN
  Administered 2016-07-03 (×2): 14 mL/h via EPIDURAL
  Filled 2016-07-03: qty 125

## 2016-07-03 MED ORDER — EPHEDRINE 5 MG/ML INJ
10.0000 mg | INTRAVENOUS | Status: DC | PRN
Start: 2016-07-03 — End: 2016-07-04
  Filled 2016-07-03: qty 4

## 2016-07-03 MED ORDER — TERBUTALINE SULFATE 1 MG/ML IJ SOLN
0.2500 mg | Freq: Once | INTRAMUSCULAR | Status: DC | PRN
  Filled 2016-07-03: qty 1

## 2016-07-03 MED ORDER — FENTANYL CITRATE (PF) 100 MCG/2ML IJ SOLN
50.0000 ug | INTRAMUSCULAR | Status: DC | PRN
  Administered 2016-07-03: 100 ug via INTRAVENOUS
  Filled 2016-07-03: qty 2

## 2016-07-03 MED ORDER — MISOPROSTOL 25 MCG QUARTER TABLET
25.0000 ug | ORAL_TABLET | ORAL | Status: DC | PRN
  Filled 2016-07-03 (×2): qty 0.25

## 2016-07-03 MED ORDER — OXYTOCIN BOLUS FROM INFUSION: 500.0000 mL | Freq: Once | INTRAVENOUS | Status: DC | PRN

## 2016-07-03 MED ORDER — PHENYLEPHRINE 40 MCG/ML (10ML) SYRINGE FOR IV PUSH (FOR BLOOD PRESSURE SUPPORT)
80.0000 ug | PREFILLED_SYRINGE | INTRAVENOUS | Status: DC | PRN
  Filled 2016-07-03: qty 5

## 2016-07-03 MED ORDER — EPHEDRINE 5 MG/ML INJ
10.0000 mg | INTRAVENOUS | Status: DC | PRN
  Filled 2016-07-03: qty 4

## 2016-07-03 MED ORDER — MISOPROSTOL 25 MCG QUARTER TABLET: 25.0000 ug | ORAL_TABLET | Freq: Once | ORAL | Status: DC

## 2016-07-03 MED ORDER — LACTATED RINGERS IV SOLN
INTRAVENOUS | Status: DC
  Administered 2016-07-03: 09:00:00 via INTRAVENOUS
  Administered 2016-07-03: 125 mL/h via INTRAVENOUS
  Administered 2016-07-04: 02:00:00 via INTRAVENOUS
  Administered 2016-07-04: 125 mL/h via INTRAVENOUS

## 2016-07-03 MED ORDER — LACTATED RINGERS IV SOLN: 500.0000 mL | Freq: Once | INTRAVENOUS | Status: DC

## 2016-07-03 MED ORDER — OXYCODONE-ACETAMINOPHEN 5-325 MG PO TABS: 2.0000 | ORAL_TABLET | ORAL | Status: DC | PRN

## 2016-07-03 MED ORDER — OXYTOCIN 40 UNITS IN LACTATED RINGERS INFUSION - SIMPLE MED
1.0000 m[IU]/min | INTRAVENOUS | Status: DC
  Administered 2016-07-03: 2 m[IU]/min via INTRAVENOUS
  Filled 2016-07-03: qty 1000

## 2016-07-03 MED ORDER — LACTATED RINGERS IV SOLN
500.0000 mL | Freq: Once | INTRAVENOUS | Status: AC
  Administered 2016-07-03: 500 mL via INTRAVENOUS

## 2016-07-03 MED ORDER — PENICILLIN G POTASSIUM 5000000 UNITS IJ SOLR
2.5000 10*6.[IU] | INTRAMUSCULAR | Status: DC
  Administered 2016-07-03 – 2016-07-04 (×2): 2.5 10*6.[IU] via INTRAVENOUS
  Filled 2016-07-03 (×7): qty 2.5

## 2016-07-03 MED ORDER — ACETAMINOPHEN 325 MG PO TABS: 650.0000 mg | ORAL_TABLET | ORAL | Status: DC | PRN

## 2016-07-03 MED ORDER — OXYTOCIN 40 UNITS IN LACTATED RINGERS INFUSION - SIMPLE MED: 2.5000 [IU]/h | Freq: Once | INTRAVENOUS | Status: DC | PRN

## 2016-07-03 MED ORDER — LIDOCAINE HCL (PF) 1 % IJ SOLN
30.0000 mL | INTRAMUSCULAR | Status: DC | PRN
  Filled 2016-07-03: qty 30

## 2016-07-03 MED ORDER — MISOPROSTOL 25 MCG QUARTER TABLET
25.0000 ug | ORAL_TABLET | ORAL | Status: DC
  Administered 2016-07-03 (×2): 25 ug via ORAL
  Filled 2016-07-03 (×2): qty 1
  Filled 2016-07-03: qty 0.25

## 2016-07-03 MED ORDER — DIPHENHYDRAMINE HCL 50 MG/ML IJ SOLN: 12.5000 mg | INTRAMUSCULAR | Status: DC | PRN

## 2016-07-03 MED ORDER — ONDANSETRON HCL 4 MG/2ML IJ SOLN: 4.0000 mg | Freq: Four times a day (QID) | INTRAMUSCULAR | Status: DC | PRN

## 2016-07-03 MED ORDER — OXYCODONE-ACETAMINOPHEN 5-325 MG PO TABS: 1.0000 | ORAL_TABLET | ORAL | Status: DC | PRN

## 2016-07-03 MED ORDER — MISOPROSTOL 25 MCG QUARTER TABLET
25.0000 ug | ORAL_TABLET | Freq: Once | ORAL | Status: AC
  Administered 2016-07-03: 25 ug via VAGINAL

## 2016-07-03 MED ORDER — DEXTROSE 5 % IV SOLN
5.0000 10*6.[IU] | Freq: Once | INTRAVENOUS | Status: AC
  Administered 2016-07-03: 5 10*6.[IU] via INTRAVENOUS
  Filled 2016-07-03: qty 5

## 2016-07-03 MED ORDER — PHENYLEPHRINE 40 MCG/ML (10ML) SYRINGE FOR IV PUSH (FOR BLOOD PRESSURE SUPPORT)
80.0000 ug | PREFILLED_SYRINGE | INTRAVENOUS | Status: DC | PRN
  Filled 2016-07-03: qty 10
  Filled 2016-07-03: qty 5

## 2016-07-03 MED ORDER — SOD CITRATE-CITRIC ACID 500-334 MG/5ML PO SOLN: 30.0000 mL | ORAL | Status: DC | PRN

## 2016-07-03 MED ORDER — LIDOCAINE HCL (PF) 1 % IJ SOLN
INTRAMUSCULAR | Status: DC | PRN
  Administered 2016-07-03 (×2): 6 mL

## 2016-07-03 NOTE — Anesthesia Preprocedure Evaluation (Addendum)
Anesthesia Evaluation  Patient identified by MRN, date of birth, ID band Patient awake    Reviewed: Allergy & Precautions, NPO status , Patient's Chart, lab work & pertinent test results  Airway Mallampati: II  TM Distance: >3 FB Neck ROM: Full    Dental no notable dental hx.    Pulmonary neg pulmonary ROS,    Pulmonary exam normal breath sounds clear to auscultation       Cardiovascular negative cardio ROS Normal cardiovascular exam Rhythm:Regular Rate:Normal     Neuro/Psych H/O left back pain with sciatica. Improved. negative psych ROS   GI/Hepatic negative GI ROS, Neg liver ROS,   Endo/Other  diabetes  Renal/GU negative Renal ROS  negative genitourinary   Musculoskeletal negative musculoskeletal ROS (+)   Abdominal   Peds negative pediatric ROS (+)  Hematology negative hematology ROS (+)   Anesthesia Other Findings   Reproductive/Obstetrics negative OB ROS                            Anesthesia Physical Anesthesia Plan  ASA: II  Anesthesia Plan: Epidural   Post-op Pain Management:    Induction: Intravenous  Airway Management Planned: Natural Airway  Additional Equipment:   Intra-op Plan:   Post-operative Plan:   Informed Consent: I have reviewed the patients History and Physical, chart, labs and discussed the procedure including the risks, benefits and alternatives for the proposed anesthesia with the patient or authorized representative who has indicated his/her understanding and acceptance.     Plan Discussed with: CRNA  Anesthesia Plan Comments: (Informed consent obtained prior to proceeding including risk of failure, 1% risk of PDPH, risk of minor discomfort and bruising.  Discussed rare but serious complications including epidural abscess, permanent nerve injury, epidural hematoma.  Discussed alternatives to epidural analgesia and patient desires to proceed.  Timeout  performed pre-procedure verifying patient name, procedure, and platelet count.  Patient tolerated procedure well.  Discussed through husband as interpreter. Questions answered. Patient was offered professional interpreter per nurse and declined.)       Anesthesia Quick Evaluation

## 2016-07-03 NOTE — Progress Notes (Signed)
interpreter at bedside reviewing plan. Pt verbalizes understanding

## 2016-07-03 NOTE — Anesthesia Pain Management Evaluation Note (Signed)
  CRNA Pain Management Visit Note  Patient: [name] Li, 35 y.o., female  "Hello I am a member of the anesthesia team at [facility]. We have an anesthesia team available at all times to provide care throughout the hospital, including epidural management and anesthesia for C-section. I don't know your plan for the delivery whether it a natural birth, water birth, IV sedation, nitrous supplementation, doula or epidural, but we want to meet your pain goals."   1.Was your pain managed to your expectations on prior hospitalizations?   Yes   2.What is your expectation for pain management during this hospitalization?     Labor support without medications  3.How can we help you reach that goal?   Record the patient's initial score and the patient's pain goal.   Pain: 3  Pain Goal: 10 [facility] wants you to be able to say your pain was always managed very well.  [name] Li 07/03/2016

## 2016-07-03 NOTE — H&P (Signed)
[name] PetrinSandra Li is a 35 y.o. female @ 39.1 wks  G3P2002 presenting for IOL for GDM. Denies any VB or LOF. GBS pos. OB History    Gravida Para Term Preterm AB Living   3 2 2     2    SAB TAB Ectopic Multiple Live Births           2     Past Medical History:  Diagnosis Date  . Gestational diabetes    glyburide   Past Surgical History:  Procedure Laterality Date  . NO PAST SURGERIES     Family History: family history includes Diabetes in her brother and father; Heart disease in her father; Hypertension in her father; Mental illness in her mother; Multiple births in her mother; Thyroid disease in her brother. Social History:  reports that she has never smoked. She has never used smokeless tobacco. She reports that she does not drink alcohol or use drugs.     Maternal Diabetes: Yes:  Diabetes Type:  Insulin/Medication controlled Genetic Screening: Normal Maternal Ultrasounds/Referrals: Normal Fetal Ultrasounds or other Referrals:  None Maternal Substance Abuse:  No Significant Maternal Medications:  None Significant Maternal Lab Results:  None Other Comments:  None  ROS Maternal Medical History:  Reason for admission: IOL GDM   Contractions: Onset was yesterday.   Frequency: irregular.   Perceived severity is mild.    Fetal activity: Perceived fetal activity is normal.    Prenatal complications: no prenatal complications Prenatal Complications - Diabetes: gestational. Diabetes is managed by oral agent (monotherapy).      Dilation: Fingertip Effacement (%): 30 Station: Ballotable Exam by:: [employer] CNM Student  Blood pressure (!) 136/91, pulse 82, temperature 97.8 F (36.6 C), temperature source Oral, height 5\' 7"  (1.702 m), weight 245 lb (111.1 kg), last menstrual period 10/03/2015. Maternal Exam:  Uterine Assessment: Contraction strength is mild.  Contraction frequency is irregular.   Abdomen: Patient reports no abdominal tenderness. Estimated fetal weight is  8-5; proven to 10-0.   Fetal presentation: vertex  Introitus: Normal vulva. Normal vagina.  Ferning test: not done.  Nitrazine test: not done. Amniotic fluid character: not assessed.  Pelvis: adequate for delivery.   Cervix: Cervix evaluated by digital exam.     Fetal Exam Fetal Monitor Review: Mode: ultrasound.   Variability: moderate (6-25 bpm).   Pattern: accelerations present and no decelerations.    Fetal State Assessment: Category I - tracings are normal.     Physical Exam  Constitutional: She is oriented to person, place, and time. She appears well-developed and well-nourished.  HENT:  Head: Normocephalic.  Eyes: Conjunctivae are normal. Pupils are equal, round, and reactive to light.  Neck: Normal range of motion. Neck supple.  Cardiovascular: Normal rate and regular rhythm.   Respiratory: Effort normal and breath sounds normal.  GI: Soft. Bowel sounds are normal.  Genitourinary: Vagina normal and uterus normal.  Musculoskeletal: Normal range of motion.  Neurological: She is alert and oriented to person, place, and time. She has normal reflexes.  Skin: Skin is warm and dry.  Psychiatric: She has a normal mood and affect. Her behavior is normal. Judgment and thought content normal.    Prenatal labs: ABO, Rh: O/Negative/-- (03/27 0000) Antibody: Negative (03/27 0000) Rubella: Immune (03/27 0000) RPR: NON REAC (08/02 1358)  HBsAg: Negative (03/27 0000)  HIV: NONREACTIVE (08/02 1358)  GBS: Positive (09/14 0000)   Assessment/Plan: Preg @ 39.1 wks, G3P2002, IOL- GDM, on Glyburide; SVE FT/30/-3; vertex; GBS pos; will admit to L&D  and start buccal cytotec, antibiotic therapy    [name] 07/03/2016, 9:13 AM

## 2016-07-03 NOTE — Anesthesia Procedure Notes (Signed)
Epidural Patient location during procedure: OB Start time: 07/03/2016 11:40 PM  Staffing Anesthesiologist: Sherrian Divers  Preanesthetic Checklist Completed: patient identified, site marked, surgical consent, pre-op evaluation, timeout performed, IV checked, risks and benefits discussed and monitors and equipment checked  Epidural Patient position: sitting Prep: DuraPrep Patient monitoring: blood pressure and heart rate Approach: midline Location: L4-L5 Injection technique: LOR saline  Needle:  Needle type: Tuohy  Needle gauge: 17 G Needle length: 9 cm Needle insertion depth: 6 cm Catheter type: closed end flexible Catheter size: 19 Gauge Catheter at skin depth: 14 cm Test dose: negative and Other  Assessment Events: blood not aspirated, injection not painful, no injection resistance, negative IV test and no paresthesia  Additional Notes Reason for block:procedure for pain

## 2016-07-03 NOTE — Progress Notes (Signed)
[name] Li is a 35 y.o. G3P2002 at [redacted]w[redacted]d by ultrasound admitted for induction of labor due to Gestational diabetes.  Subjective:   Objective: BP (!) 136/91   Pulse 82   Temp 97.8 F (36.6 C) (Oral)   Ht 5\' 7"  (1.702 m)   Wt 245 lb (111.1 kg)   LMP 10/03/2015 (Exact Date)   BMI 38.37 kg/m  No intake/output data recorded. No intake/output data recorded.  FHT:  FHR: 140's bpm, variability: moderate,  accelerations:  Present,  decelerations:  Absent UC:   regular, every 3-4 minutes SVE:   Dilation: Fingertip Effacement (%): 50 Station: Ballotable Exam by:: [place], SCNM  Labs: Lab Results  Component Value Date   WBC 6.2 07/03/2016   HGB 12.2 07/03/2016   HCT 36.0 07/03/2016   MCV 83.9 07/03/2016   PLT 130 (L) 07/03/2016    Assessment / Plan: IOL, not yet in labor  Labor: continue cytotec, not yet in labor  Preeclampsia:  no signs or symptoms of toxicity, intake and ouput balanced and labs stable Fetal Wellbeing:  Category I Pain Control:  Labor support without medications I/D:  n/a Anticipated MOD:  NSVD  [name] 07/03/2016, 2:46 PM

## 2016-07-04 ENCOUNTER — Encounter (HOSPITAL_COMMUNITY): Payer: Self-pay | Admitting: *Deleted

## 2016-07-04 LAB — CBC WITH DIFFERENTIAL/PLATELET
BASOS ABS: 0 10*3/uL (ref 0.0–0.1)
BASOS PCT: 0 %
EOS ABS: 0 10*3/uL (ref 0.0–0.7)
EOS PCT: 0 %
HCT: 26.2 % — ABNORMAL LOW (ref 36.0–46.0)
Hemoglobin: 8.9 g/dL — ABNORMAL LOW (ref 12.0–15.0)
Lymphocytes Relative: 17 %
Lymphs Abs: 2 10*3/uL (ref 0.7–4.0)
MCH: 28.5 pg (ref 26.0–34.0)
MCHC: 34 g/dL (ref 30.0–36.0)
MCV: 84 fL (ref 78.0–100.0)
MONO ABS: 0.5 10*3/uL (ref 0.1–1.0)
MONOS PCT: 4 %
Neutro Abs: 9.4 10*3/uL — ABNORMAL HIGH (ref 1.7–7.7)
Neutrophils Relative %: 79 %
PLATELETS: 127 10*3/uL — AB (ref 150–400)
RBC: 3.12 MIL/uL — ABNORMAL LOW (ref 3.87–5.11)
RDW: 15.7 % — AB (ref 11.5–15.5)
WBC: 11.9 10*3/uL — ABNORMAL HIGH (ref 4.0–10.5)

## 2016-07-04 LAB — CBC
HCT: 24.8 % — ABNORMAL LOW (ref 36.0–46.0)
HEMATOCRIT: 36.4 % (ref 36.0–46.0)
Hemoglobin: 12.3 g/dL (ref 12.0–15.0)
Hemoglobin: 8.3 g/dL — ABNORMAL LOW (ref 12.0–15.0)
MCH: 28.1 pg (ref 26.0–34.0)
MCH: 28.2 pg (ref 26.0–34.0)
MCHC: 33.8 g/dL (ref 30.0–36.0)
MCHC: 33.5 g/dL (ref 30.0–36.0)
MCV: 83.1 fL (ref 78.0–100.0)
MCV: 84.4 fL (ref 78.0–100.0)
PLATELETS: 120 10*3/uL — AB (ref 150–400)
PLATELETS: 114 10*3/uL — AB (ref 150–400)
RBC: 4.38 MIL/uL (ref 3.87–5.11)
RBC: 2.94 MIL/uL — AB (ref 3.87–5.11)
RDW: 15.5 % (ref 11.5–15.5)
RDW: 15.9 % — AB (ref 11.5–15.5)
WBC: 13.3 10*3/uL — AB (ref 4.0–10.5)
WBC: 11.1 10*3/uL — ABNORMAL HIGH (ref 4.0–10.5)

## 2016-07-04 LAB — SAVE SMEAR

## 2016-07-04 LAB — APTT: APTT: 29 s (ref 24–36)

## 2016-07-04 LAB — PROTIME-INR
INR: 1.12
Prothrombin Time: 14.5 seconds (ref 11.4–15.2)

## 2016-07-04 LAB — FIBRINOGEN: Fibrinogen: 308 mg/dL (ref 210–475)

## 2016-07-04 MED ORDER — MISOPROSTOL 200 MCG PO TABS
ORAL_TABLET | ORAL | Status: AC
  Filled 2016-07-04: qty 5

## 2016-07-04 MED ORDER — METHYLERGONOVINE MALEATE 0.2 MG/ML IJ SOLN: 0.2000 mg | Freq: Once | INTRAMUSCULAR | Status: DC

## 2016-07-04 MED ORDER — METHYLERGONOVINE MALEATE 0.2 MG PO TABS
0.2000 mg | ORAL_TABLET | Freq: Four times a day (QID) | ORAL | Status: DC
  Administered 2016-07-04 – 2016-07-05 (×5): 0.2 mg via ORAL
  Filled 2016-07-04 (×6): qty 1

## 2016-07-04 MED ORDER — ZOLPIDEM TARTRATE 5 MG PO TABS: 5.0000 mg | ORAL_TABLET | Freq: Every evening | ORAL | Status: DC | PRN

## 2016-07-04 MED ORDER — DIBUCAINE 1 % RE OINT: 1.0000 "application " | TOPICAL_OINTMENT | RECTAL | Status: DC | PRN

## 2016-07-04 MED ORDER — CARBOPROST TROMETHAMINE 250 MCG/ML IM SOLN
INTRAMUSCULAR | Status: AC
  Filled 2016-07-04: qty 1

## 2016-07-04 MED ORDER — SIMETHICONE 80 MG PO CHEW: 80.0000 mg | CHEWABLE_TABLET | ORAL | Status: DC | PRN

## 2016-07-04 MED ORDER — SENNOSIDES-DOCUSATE SODIUM 8.6-50 MG PO TABS
2.0000 | ORAL_TABLET | ORAL | Status: DC
  Administered 2016-07-04 – 2016-07-06 (×2): 2 via ORAL
  Filled 2016-07-04 (×3): qty 2

## 2016-07-04 MED ORDER — IBUPROFEN 600 MG PO TABS
600.0000 mg | ORAL_TABLET | Freq: Four times a day (QID) | ORAL | Status: DC
  Administered 2016-07-04 – 2016-07-05 (×7): 600 mg via ORAL
  Filled 2016-07-04 (×7): qty 1

## 2016-07-04 MED ORDER — COCONUT OIL OIL: 1.0000 "application " | TOPICAL_OIL | Status: DC | PRN

## 2016-07-04 MED ORDER — ONDANSETRON HCL 4 MG PO TABS: 4.0000 mg | ORAL_TABLET | ORAL | Status: DC | PRN

## 2016-07-04 MED ORDER — ONDANSETRON HCL 4 MG/2ML IJ SOLN: 4.0000 mg | INTRAMUSCULAR | Status: DC | PRN

## 2016-07-04 MED ORDER — BENZOCAINE-MENTHOL 20-0.5 % EX AERO
1.0000 "application " | INHALATION_SPRAY | CUTANEOUS | Status: DC | PRN
  Filled 2016-07-04: qty 56

## 2016-07-04 MED ORDER — LACTATED RINGERS IV BOLUS (SEPSIS)
500.0000 mL | Freq: Once | INTRAVENOUS | Status: AC
  Administered 2016-07-04: 500 mL via INTRAVENOUS

## 2016-07-04 MED ORDER — FENTANYL CITRATE (PF) 100 MCG/2ML IJ SOLN
INTRAMUSCULAR | Status: AC
  Filled 2016-07-04: qty 4

## 2016-07-04 MED ORDER — HYDROMORPHONE HCL 1 MG/ML IJ SOLN
2.0000 mg | Freq: Once | INTRAMUSCULAR | Status: AC
Start: 2016-07-04 — End: 2016-07-04
  Administered 2016-07-04: 2 mg via INTRAVENOUS
  Filled 2016-07-04: qty 2

## 2016-07-04 MED ORDER — LACTATED RINGERS IV BOLUS (SEPSIS): 1000.0000 mL | Freq: Once | INTRAVENOUS | Status: DC

## 2016-07-04 MED ORDER — TETANUS-DIPHTH-ACELL PERTUSSIS 5-2.5-18.5 LF-MCG/0.5 IM SUSP: 0.5000 mL | Freq: Once | INTRAMUSCULAR | Status: DC

## 2016-07-04 MED ORDER — OXYCODONE-ACETAMINOPHEN 5-325 MG PO TABS
1.0000 | ORAL_TABLET | Freq: Four times a day (QID) | ORAL | Status: DC | PRN
  Administered 2016-07-04: 1 via ORAL
  Administered 2016-07-06: 2 via ORAL
  Administered 2016-07-06: 1 via ORAL
  Administered 2016-07-07: 2 via ORAL
  Filled 2016-07-04: qty 2
  Filled 2016-07-04 (×2): qty 1
  Filled 2016-07-04: qty 2

## 2016-07-04 MED ORDER — WITCH HAZEL-GLYCERIN EX PADS: 1.0000 "application " | MEDICATED_PAD | CUTANEOUS | Status: DC | PRN

## 2016-07-04 MED ORDER — METHYLERGONOVINE MALEATE 0.2 MG/ML IJ SOLN
INTRAMUSCULAR | Status: AC
  Filled 2016-07-04: qty 3

## 2016-07-04 MED ORDER — MISOPROSTOL 200 MCG PO TABS
ORAL_TABLET | ORAL | Status: AC
  Filled 2016-07-04: qty 4

## 2016-07-04 MED ORDER — OXYCODONE HCL 5 MG PO TABS
5.0000 mg | ORAL_TABLET | Freq: Four times a day (QID) | ORAL | Status: DC | PRN
  Administered 2016-07-05 – 2016-07-06 (×4): 5 mg via ORAL
  Filled 2016-07-04 (×4): qty 1

## 2016-07-04 MED ORDER — DIPHENHYDRAMINE HCL 25 MG PO CAPS: 25.0000 mg | ORAL_CAPSULE | Freq: Four times a day (QID) | ORAL | Status: DC | PRN

## 2016-07-04 MED ORDER — OXYTOCIN 40 UNITS IN LACTATED RINGERS INFUSION - SIMPLE MED
INTRAVENOUS | Status: AC
  Filled 2016-07-04: qty 1000

## 2016-07-04 MED ORDER — ACETAMINOPHEN 325 MG PO TABS
650.0000 mg | ORAL_TABLET | ORAL | Status: DC | PRN
  Administered 2016-07-04: 650 mg via ORAL
  Filled 2016-07-04: qty 2

## 2016-07-04 MED ORDER — MISOPROSTOL 200 MCG PO TABS
800.0000 ug | ORAL_TABLET | Freq: Once | ORAL | Status: AC
  Administered 2016-07-04: 800 ug via VAGINAL

## 2016-07-04 MED ORDER — PRENATAL MULTIVITAMIN CH
1.0000 | ORAL_TABLET | Freq: Every day | ORAL | Status: DC
  Administered 2016-07-04 – 2016-07-07 (×4): 1 via ORAL
  Filled 2016-07-04 (×4): qty 1

## 2016-07-04 MED ORDER — LACTATED RINGERS IV BOLUS (SEPSIS): 500.0000 mL | Freq: Once | INTRAVENOUS | Status: DC

## 2016-07-04 NOTE — Progress Notes (Signed)
UR chart review completed.  

## 2016-07-04 NOTE — Progress Notes (Signed)
Pt complaining of "lots of bleeding" via husband. Peri pad, bed pad saturated. EBL done. MD notified. PPH code called at this time.

## 2016-07-04 NOTE — Progress Notes (Signed)
MD at bedside. Methergine 0.2 mg IM given.  950 cc of Blood clot expressed at this time. IV started. Bolus of LR VSS stable. Pt alert.

## 2016-07-04 NOTE — Progress Notes (Signed)
I was present in the room with Dr Alysia PennaErvin for explanation of care, by Orlan LeavensViria Alvarez

## 2016-07-04 NOTE — Lactation Note (Signed)
This note was copied from a baby's chart. Lactation Consultation Note  Patient Name: [name] Li [id number] Date: 07/04/2016 Reason for consult: Follow-up assessment [name], in house Spanish interpreter present for visit. Mom had PPH today, hgb has dropped from 12.3 to 8.9. Assisted Mom with latching baby at this visit. Baby latched well using breast compression.  Baby bili 4.9 at 10 hours of age which is high intermediate zone. Encouraged Mom to offer breast with feeding ques but no longer than every 3 hours. Discussed possible delay in milk in coming in due to [facility] and encouraged post pumping to stimulate milk production and to have EBM to supplement if needed. Mom not willing to pump today but may consider tomorrow morning. No colostrum received with hand expression at this visit. Advised Mom she may need to supplement baby after BF to help manage bili levels and to support weight and energy till her milk comes. Mom wants to wait till next weight check and bili check to decide on supplementing. Mom has BF 2 older children and made a lot of milk per her report. Explained this may be different due to blood loss but will support her decision. Encouraged to ask for assist as needed with latch or for questions/concerns.   Maternal Data    Feeding Feeding Type: Breast Fed Length of feed: 20 min  LATCH Score/Interventions Latch: Grasps breast easily, tongue down, lips flanged, rhythmical sucking.  Audible Swallowing: A few with stimulation  Type of Nipple: Everted at rest and after stimulation  Comfort (Breast/Nipple): Soft / non-tender     Hold (Positioning): Assistance needed to correctly position infant at breast and maintain latch. Intervention(s): Breastfeeding basics reviewed;Support Pillows;Position options;Skin to skin  LATCH Score: 8  Lactation Tools Discussed/Used     Consult Status Consult Status: Follow-up Date: 07/05/16 Follow-up type:  In-patient    Alfred Levins 07/04/2016, 8:21 PM

## 2016-07-04 NOTE — Progress Notes (Signed)
OB Attending Interrupter present Pt passed some more large blood clots. Labs noted. VSS  Sharp Uterine curettage performed at bedside, scant blood clots removed  Cytotec 800 mcg placed rectal  Will start Methergine series and repeat labs. Continue with close observation

## 2016-07-04 NOTE — Anesthesia Postprocedure Evaluation (Signed)
Anesthesia Post Note  Patient: Ivadell Sarratt  Procedure(s) Performed: * No procedures listed *  Patient location during evaluation: Mother Baby Anesthesia Type: Epidural Level of consciousness: awake and alert Pain management: pain level controlled Vital Signs Assessment: post-procedure vital signs reviewed and stable Respiratory status: spontaneous breathing, nonlabored ventilation and respiratory function stable Cardiovascular status: stable Postop Assessment: no headache, no backache and epidural receding Anesthetic complications: no     Last Vitals:  Vitals:   07/04/16 1130 07/04/16 1234  BP: 117/71 111/80  Pulse: (!) 108 (!) 101  Resp: 18 18  Temp: 36.9 C 36.8 C    Last Pain:  Vitals:   07/04/16 1235  TempSrc:   PainSc: 0-No pain   Pain Goal: Patients Stated Pain Goal: 3 (07/04/16 0830)               Marrion Coy

## 2016-07-04 NOTE — Progress Notes (Signed)
MD at bedside.  Uterine curettage performed scant blood clot at this time. Cytotec 800 mcg placed rectal. Interpreter at bedside  Pt informed of procedure.

## 2016-07-04 NOTE — Lactation Note (Signed)
This note was copied from a baby's chart. Lactation Consultation Note Mom has 34 yr old and an 26 yr old that she BF for 8 months each. Denies difficulty or infections. FOB interpreter for mom as well as RN speaks [place].  Mom states understands. Mom has pendulum breast hand expression taught w/no colostrum noted. Mom stated that no breast size increase noted, but areola is much darker.   Spanish baby and me book reviewed for BF. Discussed STS, I&O, newborn behavior, supply and demand, and supplementing. Mom plans on breast feed as well as supplement w/formula in bottle.  Referred to Baby and Me Book in Breastfeeding section Pg. 22-23 for position options and Proper latch demonstration.Mom encouraged to waken baby for feeds. Referred to Baby and Me Book in Breastfeeding section Pg. 22-23 for position options and Proper latch demonstration. WH/LC brochure given w/resources, support groups and LC services. Patient Name: [name] Li [id number] Date: 07/04/2016 Reason for consult: Initial assessment   Maternal Data Has patient been taught Hand Expression?: Yes Does the patient have breastfeeding experience prior to this delivery?: Yes  Feeding    LATCH Score/Interventions Latch: (P) Grasps breast easily, tongue down, lips flanged, rhythmical sucking.  Audible Swallowing: (P) Spontaneous and intermittent  Type of Nipple: Everted at rest and after stimulation  Comfort (Breast/Nipple): Soft / non-tender     Hold (Positioning): (P) No assistance needed to correctly position infant at breast. Intervention(s): Breastfeeding basics reviewed;Support Pillows;Position options;Skin to skin  LATCH Score: (P) 10  Lactation Tools Discussed/Used WIC Program: No   Consult Status Consult Status: Follow-up Date: 07/04/16 Follow-up type: In-patient    [name] Li, [name] Li 07/04/2016, 5:55 AM

## 2016-07-04 NOTE — Progress Notes (Signed)
Pt was assisted to bathroom Voided a large amt of urine.[name] Li very weak and  Dizzy. .  Large amt of blleeding noted on V-pad. Vitals taken Bp 15l/120. Assisted back to bed. Vital retaken. 113/74. States she feels better. Reposotions for comfort.

## 2016-07-04 NOTE — Progress Notes (Signed)
CTSP due to increased vaginal delivery. Interrupter present  Pt had TSVD without problems @ 3:45 Am. Pt had episode earlier of almost passing out and passing large amount of blood clots. Physician was notified.  Upon entry into room. Fundal message performed. U-2. Aprox. 950 cc of blood clots expressed. No tears or lacerations noted other than first degree which repair was intact.  IV placed x 2. Fluid bolus given. Foley placed. VSS stable Labs drawn. Mehtergine IM x 1   Pt stable, U-2 firm, scant bleeding noted.  Will continue to observe closely

## 2016-07-04 NOTE — Progress Notes (Signed)
Pt c/o lightheadedness. Lochia moderate at this time . MD notified.

## 2016-07-04 NOTE — Progress Notes (Signed)
Foley catheter discontinued per orders and patient tolerated well. Patient instructed to call nurse for assistance to get up to bathroom with interpreter and she verbalized understanding.

## 2016-07-05 DIAGNOSIS — Z3A39 39 weeks gestation of pregnancy: Secondary | ICD-10-CM

## 2016-07-05 DIAGNOSIS — O99824 Streptococcus B carrier state complicating childbirth: Secondary | ICD-10-CM

## 2016-07-05 DIAGNOSIS — O24425 Gestational diabetes mellitus in childbirth, controlled by oral hypoglycemic drugs: Secondary | ICD-10-CM

## 2016-07-05 LAB — PREPARE RBC (CROSSMATCH)

## 2016-07-05 LAB — GLUCOSE, CAPILLARY: GLUCOSE-CAPILLARY: 87 mg/dL (ref 65–99)

## 2016-07-05 MED ORDER — RHO D IMMUNE GLOBULIN 1500 UNIT/2ML IJ SOSY
300.0000 ug | PREFILLED_SYRINGE | Freq: Once | INTRAMUSCULAR | Status: AC
  Administered 2016-07-05: 300 ug via INTRAVENOUS
  Filled 2016-07-05: qty 2

## 2016-07-05 MED ORDER — SODIUM CHLORIDE 0.9 % IV SOLN
510.0000 mg | Freq: Once | INTRAVENOUS | Status: AC
  Administered 2016-07-05: 510 mg via INTRAVENOUS
  Filled 2016-07-05: qty 17

## 2016-07-05 MED ORDER — SODIUM CHLORIDE 0.9 % IV SOLN
Freq: Once | INTRAVENOUS | Status: AC
  Administered 2016-07-05: 1 mL via INTRAVENOUS

## 2016-07-05 NOTE — Progress Notes (Signed)
I assisted RN Jana from lactation , ordered patients meals, and RN with information about care during the night, by Orlan Leavens Spanish Interpreter.

## 2016-07-05 NOTE — Progress Notes (Signed)
Called to patient room to discuss blood transfusion. After discussion this AM pt was agreeable to transfusion but has since changed her mind. She was informed of risks of falling, passing out and injuring herself and baby and she voices understanding. She is agreeable to feraheme and understands risks associated with this as well. Will D/C blood transfusion order feraheme.

## 2016-07-05 NOTE — Lactation Note (Signed)
This note was copied from a baby's chart. Lactation Consultation Note Follow up visit at 41 hours of age with spanish interpreter,Viria.  Mom reports good feedings and reports minimal pain with latch on.  Moms nipples are intact.  Lc encouraged mom to hand express pre and post feedings and apply colostrum to nipples after feedings.  Mom is able to return demonstration of hand expression. Mom reports baby has had some bottle feedings, and 12 feedings charted.  Baby has had adequate output for past 24 hours.  Mom denies further concerns at this time.  RN at bedside working with mom.     Patient Name: [name] Li [id number] Date: 07/05/2016 Reason for consult: Follow-up assessment   Maternal Data Has patient been taught Hand Expression?: Yes  Feeding Feeding Type: Breast Fed Length of feed: 15 min  LATCH Score/Interventions                Intervention(s): Breastfeeding basics reviewed     Lactation Tools Discussed/Used     Consult Status Consult Status: Follow-up Date: 07/06/16 Follow-up type: In-patient    Jannifer Rodney 07/05/2016, 8:44 PM

## 2016-07-05 NOTE — Progress Notes (Signed)
POSTPARTUM PROGRESS NOTE  Post Partum Day 1 Subjective:  [name] PetrinSandra Li is a 35 y.o. G3P3003 4885w2d s/p SVD.  No acute events overnight.  Pt denies problems with ambulating, voiding or po intake.  She denies nausea or vomiting.  Pain is moderately controlled.  She has had flatus. She has had bowel movement.  Lochia Small.   Pt did have passage of some large clots last night. Resolved with methergine and cytotec. On Methergine series at this time. She is reporting, weakness lightheadedness and dizziness.   Objective: Blood pressure 116/68, pulse 89, temperature 97.9 F (36.6 C), resp. rate 20, height 5\' 7"  (1.702 m), weight 245 lb (111.1 kg), last menstrual period 10/03/2015, SpO2 98 %, unknown if currently breastfeeding.  Physical Exam:  General: alert, cooperative and no distress Lochia:normal flow Chest: no respiratory distress Heart:regular rate, distal pulses intact Abdomen: soft, nontender,  Uterine Fundus: firm, appropriately tender DVT Evaluation: No calf swelling or tenderness Extremities: trace edema   Recent Labs  07/04/16 1808 07/05/16 0529  HGB 8.3* 6.7*  HCT 24.8* 19.6*    Assessment/Plan:  ASSESSMENT: [name] PetrinSandra Li is a 35 y.o. [id number] 6385w2d s/p SVD  After long discussion with pt using interpretor she is agreeable to 2 units blood transfusion given her anemia. Signed consent with use of the interpretor.    LOS: 2 days   Les Pouicholas SchenkMD 07/05/2016, 12:15 PM

## 2016-07-06 LAB — CBC
HCT: 19.6 % — ABNORMAL LOW (ref 36.0–46.0)
HEMATOCRIT: 17.3 % — AB (ref 36.0–46.0)
HEMOGLOBIN: 6 g/dL — AB (ref 12.0–15.0)
Hemoglobin: 6.7 g/dL — CL (ref 12.0–15.0)
MCH: 29.1 pg (ref 26.0–34.0)
MCH: 29.6 pg (ref 26.0–34.0)
MCHC: 34.2 g/dL (ref 30.0–36.0)
MCHC: 34.7 g/dL (ref 30.0–36.0)
MCV: 85.2 fL (ref 78.0–100.0)
MCV: 85.2 fL (ref 78.0–100.0)
PLATELETS: 77 10*3/uL — AB (ref 150–400)
Platelets: 89 10*3/uL — ABNORMAL LOW (ref 150–400)
RBC: 2.3 MIL/uL — ABNORMAL LOW (ref 3.87–5.11)
RBC: 2.03 MIL/uL — AB (ref 3.87–5.11)
RDW: 16.3 % — AB (ref 11.5–15.5)
RDW: 16.3 % — ABNORMAL HIGH (ref 11.5–15.5)
WBC: 7.4 10*3/uL (ref 4.0–10.5)
WBC: 6.3 10*3/uL (ref 4.0–10.5)

## 2016-07-06 LAB — RH IG WORKUP (INCLUDES ABO/RH)
ABO/RH(D): O NEG
FETAL SCREEN: NEGATIVE
Gestational Age(Wks): 39
UNIT DIVISION: 0

## 2016-07-06 LAB — PREPARE RBC (CROSSMATCH)

## 2016-07-06 MED ORDER — SENNOSIDES-DOCUSATE SODIUM 8.6-50 MG PO TABS: 2.0000 | ORAL_TABLET | ORAL | 0 refills | Status: DC

## 2016-07-06 MED ORDER — FERROUS SULFATE 325 (65 FE) MG PO TABS
325.0000 mg | ORAL_TABLET | Freq: Two times a day (BID) | ORAL | Status: DC
  Administered 2016-07-06 – 2016-07-07 (×3): 325 mg via ORAL
  Filled 2016-07-06 (×3): qty 1

## 2016-07-06 MED ORDER — FERROUS SULFATE 325 (65 FE) MG PO TABS: 325.0000 mg | ORAL_TABLET | Freq: Two times a day (BID) | ORAL | 3 refills | Status: DC

## 2016-07-06 MED ORDER — SODIUM CHLORIDE 0.9 % IV SOLN: Freq: Once | INTRAVENOUS | Status: DC

## 2016-07-06 NOTE — Discharge Summary (Signed)
OB Discharge Summary     Patient Name: [name] Li DOB: 07/21/1981 MRN: [id number]  Date of admission: 07/03/2016 Delivering MD: [name] Li   Date of discharge: 07/06/2016  Admitting diagnosis: induction  Intrauterine pregnancy: [redacted]w[redacted]d     Secondary diagnosis:  Active Problems:   Labor and delivery, indication for care  Additional problems: post partum anemia     Discharge diagnosis: Term Pregnancy Delivered                                                                                                Post partum procedures:blood transfusion and feraheme infusion  Augmentation: Cytotec  Complications: post partum hemmorhage  Hospital course:  Induction of Labor With Vaginal Delivery   35 y.o. yo [id number] at [redacted]w[redacted]d was admitted to the hospital 07/03/2016 for induction of labor.  Indication for induction: A2 DM.  Patient had an uncomplicated labor course as follows: Membrane Rupture Time/Date: 10:10 PM ,07/03/2016   Intrapartum Procedures: Episiotomy:                                           Lacerations:  1st Li [2];Vaginal [6]  Patient had delivery of a Viable infant.  Information for the patient's newborn:  [name], Li [[id number]]  Delivery Method: Vaginal, Spontaneous Delivery (Filed from Delivery Summary)   07/04/2016  Details of delivery can be found in separate delivery note.  Patient had a routine postpartum course. Patient is discharged home 07/06/16.   Physical exam  Vitals:   07/06/16 0532 07/06/16 0907 07/06/16 0950 07/06/16 1131  BP: (!) 100/55 101/62 117/67 124/76  Pulse: 87 97 (!) 105 97  Resp: 18 20 20 20   Temp: 98.2 F (36.8 C) 98.4 F (36.9 C) 98.4 F (36.9 C) 98.5 F (36.9 C)  TempSrc: Oral Oral Oral Oral  SpO2:  96% 99% 98%  Weight:      Height:       General: alert, cooperative and no distress Lochia: appropriate Uterine Fundus: soft Incision: N/A DVT Evaluation: No evidence of DVT seen on physical  exam. No significant calf/ankle edema. Labs: Lab Results  Component Value Date   WBC 6.3 07/06/2016   HGB 6.0 (LL) 07/06/2016   HCT 17.3 (L) 07/06/2016   MCV 85.2 07/06/2016   PLT 89 (L) 07/06/2016   CMP Latest Ref Rng & Units 02/29/2016  Glucose 65 - 99 mg/dL 952(W)  BUN 7 - 25 mg/dL 4(L)  Creatinine 4.13 - 1.10 mg/dL 2.44(W)  Sodium 102 - 725 mmol/L 134(L)  Potassium 3.5 - 5.3 mmol/L 3.9  Chloride 98 - 110 mmol/L 103  CO2 20 - 31 mmol/L 20  Calcium 8.6 - 10.2 mg/dL 8.6  Total Protein 6.1 - 8.1 g/dL 6.6  Total Bilirubin 0.2 - 1.2 mg/dL 0.5  Alkaline Phos 33 - 115 U/L 49  AST 10 - 30 U/L 17  ALT 6 - 29 U/L 21    Discharge instruction: per After Visit Summary and "Baby and Me Booklet".  After visit meds:    Medication List    STOP taking these medications   aspirin EC 81 MG tablet   glyBURIDE 5 MG tablet Commonly known as:  DIABETA   lansoprazole 15 MG capsule Commonly known as:  PREVACID   promethazine 25 MG tablet Commonly known as:  PHENERGAN   sucralfate 1 g tablet Commonly known as:  CARAFATE     TAKE these medications   ferrous sulfate 325 (65 FE) MG tablet Take 1 tablet (325 mg total) by mouth 2 (two) times daily with a meal.   prenatal multivitamin Tabs tablet Take 1 tablet by mouth at bedtime.   senna-docusate 8.6-50 MG tablet Commonly known as:  Senokot-S Take 2 tablets by mouth daily. Start taking on:  07/07/2016       Diet: routine diet  Activity: Advance as tolerated. Pelvic rest for 6 weeks.   Outpatient follow up:2 weeks Follow up Appt: Future Appointments Date Time Provider Department Center  08/02/2016 9:20 AM [facility] [place] [facility]   Follow up Visit:No Follow-up on file.  Postpartum contraception: IUD Mirena  Newborn Data: Live born female  Birth Weight: 8 lb 10.1 oz (3915 g) APGAR: 8, 9  Baby Feeding: Breast Disposition:home with mother   07/06/2016 [name] PennaNicholas Acquanetta Cabanilla, MD

## 2016-07-06 NOTE — Lactation Note (Addendum)
This note was copied from a baby's chart. Lactation Consultation Note; Family member in room to interpret . Mother getting blood. Mother states that infant is feeding well. She denies having any breastfeeding concerns. Mother confirms that her breast are filling.  Advised mother to continue to feed infant 8-12 times in 24 hours. Mother advised to massage breast and use ice to decrease swelling. Mother receptive to all teaching.   Patient Name: [name] Li [id number] Date: 07/06/2016 Reason for consult: Follow-up assessment   Maternal Data    Feeding    LATCH Score/Interventions                      Lactation Tools Discussed/Used     Consult Status Consult Status: Complete    Michel Bickers 07/06/2016, 10:32 AM

## 2016-07-06 NOTE — Progress Notes (Signed)
  Patient seen and evaluated due to complaints of headache. Received 10mg  percocet an hour prior to being seen with little improvement in headache, rates it as 5/10. Feels like pain goes down right side of her head to throat, throat hurts as well. Feels slightly hot but no documented fevers, no chills. Has been able to get up from the bed some but does not feel like eating much. Denies bleeding, abdominal pain.   Baby is staying until tomorrow for phototherapy. Will cancel discharge order on MOB and get AM CBC.  Plan for discharge tomorrow  Dolores Patty, DO PGY-1, [facility] 07/06/2016 6:36 PM

## 2016-07-07 LAB — CBC
HEMATOCRIT: 24.1 % — AB (ref 36.0–46.0)
HEMATOCRIT: 20 % — AB (ref 36.0–46.0)
HEMOGLOBIN: 8.1 g/dL — AB (ref 12.0–15.0)
Hemoglobin: 6.8 g/dL — CL (ref 12.0–15.0)
MCH: 29.3 pg (ref 26.0–34.0)
MCH: 29.4 pg (ref 26.0–34.0)
MCHC: 33.6 g/dL (ref 30.0–36.0)
MCHC: 34 g/dL (ref 30.0–36.0)
MCV: 87.3 fL (ref 78.0–100.0)
MCV: 86.6 fL (ref 78.0–100.0)
PLATELETS: 107 10*3/uL — AB (ref 150–400)
Platelets: 140 10*3/uL — ABNORMAL LOW (ref 150–400)
RBC: 2.76 MIL/uL — ABNORMAL LOW (ref 3.87–5.11)
RBC: 2.31 MIL/uL — AB (ref 3.87–5.11)
RDW: 16.1 % — ABNORMAL HIGH (ref 11.5–15.5)
RDW: 15.9 % — ABNORMAL HIGH (ref 11.5–15.5)
WBC: 8.5 10*3/uL (ref 4.0–10.5)
WBC: 6.3 10*3/uL (ref 4.0–10.5)

## 2016-07-07 LAB — TYPE AND SCREEN
ABO/RH(D): O NEG
Antibody Screen: NEGATIVE
UNIT DIVISION: 0
Unit division: 0

## 2016-07-07 NOTE — Progress Notes (Signed)
[facility] critical hgb of 6.8.  Also notified of patient's complaint of headache though night that is relieved by Percocet and is not positional.  Will continue to monitor.

## 2016-07-07 NOTE — Lactation Note (Signed)
This note was copied from a baby's chart. Lactation Consultation Note: Experienced BF mom . Baby continues under phototherapy. Breasts are filling. Mom reports breast feeding going well, no problems.   Patient Name: [name] Edsel PetrinSandra Enriquez-Castro UJWJX'BToday's Date: 07/07/2016 Reason for consult: Follow-up assessment   Maternal Data Formula Feeding for Exclusion: No Does the patient have breastfeeding experience prior to this delivery?: Yes  Feeding Feeding Type: Breast Fed Length of feed: 60 min  LATCH Score/Interventions                      Lactation Tools Discussed/Used     Consult Status Consult Status: Complete    Pamelia HoitWeeks, Pema Thomure D 07/07/2016, 8:42 AM

## 2016-07-07 NOTE — Discharge Instructions (Signed)
Parto vaginal, Cuidados posteriores  °(Vaginal Delivery, Care After) °Siga estas instrucciones durante las próximas semanas. Estas indicaciones para el alta le proporcionan información general acerca de cómo deberá cuidarse después del parto. El médico también podrá darle instrucciones específicas. El tratamiento ha sido planificado según las prácticas médicas actuales, pero en algunos casos pueden ocurrir problemas. Comuníquese con el médico si tiene algún problema o tiene preguntas al volver a su casa.  °INSTRUCCIONES PARA EL CUIDADO EN EL HOGAR  °· Tome sólo medicamentos de venta libre o recetados, según las indicaciones del médico o del farmacéutico. °· No beba alcohol, especialmente si está amamantando o toma analgésicos. °· No mastique tabaco ni fume. °· No consuma drogas. °· Continúe con un adecuado cuidado perineal. El buen cuidado perineal incluye: °¨ Higienizarse de adelante hacia atrás. °¨ Mantener la zona perineal limpia. °· No use tampones ni duchas vaginales hasta que su médico la autorice. °· Dúchese, lávese el cabello y tome baños de inmersión según las indicaciones de su médico. °· Utilice un sostén que le ajuste bien y que brinde buen soporte a sus mamas. °· Consuma alimentos saludables. °· Beba suficiente líquido para mantener la orina clara o de color amarillo pálido. °· Consuma alimentos ricos en fibra como cereales y panes integrales, arroz, frijoles y frutas y verduras frescas todos los días. Estos alimentos pueden ayudarla a prevenir o aliviar el estreñimiento. °· Siga las recomendaciones de su médico relacionadas con la reanudación de actividades como subir escaleras, conducir automóviles, levantar objetos, hacer ejercicios o viajar. °· Hable con su médico acerca de reanudar la actividad sexual. Volver a la actividad sexual depende del riesgo de infección, la velocidad de la curación y la comodidad y su deseo de reanudarla. °· Trate de que alguien la ayude con las actividades del hogar y con  el recién nacido al menos durante un par de días después de salir del hospital. °· Descanse todo lo que pueda. Trate de descansar o tomar una siesta mientras el bebé está durmiendo. °· Aumente sus actividades gradualmente. °· Cumpla con todas las visitas de control programadas para después del parto. Es muy importante asistir a todas las citas programadas de seguimiento. En estas citas, su médico va a controlarla para asegurarse de que esté sanando física y emocionalmente. °SOLICITE ATENCIÓN MÉDICA SI:  °· Elimina coágulos grandes por la vagina. Guarde algunos coágulos para mostrarle al médico. °· Tiene una secreción con feo olor que proviene de la vagina. °· Tiene dificultad para orinar. °· Orina con frecuencia. °· Siente dolor al orinar. °· Nota un cambio en sus movimientos intestinales. °· Aumenta el enrojecimiento, el dolor o la hinchazón en la zona de la incisión vaginal (episiotomía) o el desgarro vaginal. °· Tiene pus que drena por la episiotomía o el desgarro vaginal. °· La episiotomía o el desgarro vaginal se abren. °· Sus mamas le duelen, están duras o enrojecidas. °· Sufre un dolor intenso de cabeza. °· Tiene visión borrosa o ve manchas. °· Se siente triste o deprimida. °· Tiene pensamientos acerca de lastimarse o dañar al recién nacido. °· Tiene preguntas acerca de su cuidado personal, el cuidado del recién nacido o acerca de los medicamentos. °· Se siente mareada o sufre un desmayo. °· Tiene una erupción. °· Tiene náuseas o vómitos. °· Usted amamantó al bebé y no ha tenido su período menstrual dentro de las 12 semanas después de dejar de amamantar. °· No amamanta al bebé y no tuvo su período menstrual en las últimas 12° semanas después del   parto. °· Tiene fiebre. °SOLICITE ATENCIÓN MÉDICA DE INMEDIATO SI:  °· Siente dolor persistente. °· Siente dolor en el pecho. °· Le falta el aire. °· Se desmaya. °· Siente dolor en la pierna. °· Siente dolor en el estómago. °· El sangrado vaginal satura dos o más  apósitos en 1 hora. °  °Esta información no tiene como fin reemplazar el consejo del médico. Asegúrese de hacerle al médico cualquier pregunta que tenga. °  °Document Released: 09/12/2005 Document Revised: 06/03/2015 °Elsevier Interactive Patient Education ©2016 Elsevier Inc. ° °

## 2016-07-07 NOTE — Progress Notes (Signed)
CRITICAL VALUE ALERT  Critical value received:  6.8 hgb  Date of notification:  07/07/16  Time of notification:  0620  Critical value read back:Yes.    Nurse who received alert:  Dimas Chyle, RN  MD notified (1st page):  Sunday Corn (Resident)  Time of first page:  0630  MD notified (2nd page):  Time of second page:  Responding MD:  Sunday Corn  Time MD responded:  0630

## 2016-07-08 ENCOUNTER — Encounter (HOSPITAL_COMMUNITY): Payer: Self-pay

## 2016-07-08 ENCOUNTER — Ambulatory Visit (HOSPITAL_COMMUNITY): Payer: Self-pay

## 2016-07-12 DIAGNOSIS — D62 Acute posthemorrhagic anemia: Secondary | ICD-10-CM | POA: Diagnosis not present

## 2016-08-02 ENCOUNTER — Encounter: Payer: Self-pay | Admitting: Family Medicine

## 2016-08-02 ENCOUNTER — Ambulatory Visit (INDEPENDENT_AMBULATORY_CARE_PROVIDER_SITE_OTHER): Payer: Self-pay | Admitting: Family Medicine

## 2016-08-02 DIAGNOSIS — Z30011 Encounter for initial prescription of contraceptive pills: Secondary | ICD-10-CM

## 2016-08-02 MED ORDER — NORGESTIMATE-ETH ESTRADIOL 0.25-35 MG-MCG PO TABS: 1.0000 | ORAL_TABLET | Freq: Every day | ORAL | 11 refills | Status: DC

## 2016-08-02 NOTE — Progress Notes (Signed)
Video Interpreter [phone]  Subjective:     [name] Li is a 35 y.o. female who presents for a postpartum visit. She is 4 weeks postpartum following a spontaneous vaginal delivery. I have fully reviewed the prenatal and intrapartum course. The delivery was at 39 2/7 gestational weeks. Outcome: spontaneous vaginal delivery. Anesthesia: epidural. Postpartum course has been complicated by a postpartum hemorrhage that required medications and bedside curettage, but since has been uncomplicated. Baby's course has been uncomplicated. Baby is feeding by both breast and bottle - Similac Advance. Bleeding no bleeding. Bowel function is normal. Bladder function is normal. Patient is not sexually active. Contraception method is OCP (estrogen/progesterone). Postpartum depression screening: negative.  The following portions of the patient's history were reviewed and updated as appropriate: allergies, current medications, past family history, past medical history, past social history, past surgical history and problem list.  Review of Systems Pertinent items are noted in HPI.   Objective:    BP 116/73   Pulse 93   Wt 216 lb 6.4 oz (98.2 kg)   BMI 33.89 kg/m   General:  alert, cooperative, appears stated age and no distress   Breasts:  negative findings: normal in size and symmetry, normal contour with no evidence of flattening or dimpling, skin normal, nipples everted without rashes or discharge, no palpable axillary lymphadenopathy and positive findings: engorged area that is tender and non-erythematous on right lower outer quadrant; good milk production from nipples bilaterally  Lungs: clear to auscultation bilaterally  Heart:  regular rate and rhythm, S1, S2 normal, no murmur, click, rub or gallop  Abdomen: soft, non-tender; bowel sounds normal; no masses,  no organomegaly   Vulva:  normal  Vagina: normal vagina, no discharge, exudate, lesion, or erythema; perneum intact, healing well, stitches  not visualized.        Assessment:   4 week postpartum exam. Pap smear done 2015 at [facility], normal.   Plan:    1. Contraception: OCP (estrogen/progesterone) 2. No signs/symptoms of anemia, received blood transfusion at hospital postpartum for hemorrhage.  3. Follow up in: 1 year for repeat pap smear or as needed.

## 2016-08-02 NOTE — Patient Instructions (Signed)
Uso de los anticonceptivos orales (Oral Contraception Use) Los anticonceptivos orales (ACO) son medicamentos que se utilizan para evitar el embarazo. Su funcin es evitar que los ovarios liberen vulos. Las hormonas de los ACO tambin hacen que el moco cervical se haga ms espeso, lo que evita que el esperma ingrese al tero. Tambin hacen que la membrana que recubre internamente al tero se vuelva ms fina, lo que no permite que el huevo fertilizado se adhiera a la pared del tero. Los ACO son muy efectivos cuando se toman exactamente como se prescriben. Sin embargo, no previenen contra las enfermedades de transmisin sexual (ETS). La prctica del sexo seguro, como el uso de preservativos, junto con los ACO, ayudan a prevenir ese tipo de enfermedades. Antes de tomar ACO, debe hacerse un examen fsico y un Papanicolau. El mdico podr indicarle anlisis de sangre, si es necesario. El mdico se asegurar de que usted sea una buena candidata para usar anticonceptivos orales. Converse con su mdico acerca de los posibles efectos secundarios de los ACO que podran recetarle. Cuando se inicia el uso de ACO, se pueden tomar durante 2 a 3 meses para que el cuerpo se adapte a los cambios en los niveles hormonales en el cuerpo.  CMO TOMAR LOS ANTICONCEPTIVOS ORALES El mdico le indicar como comenzar a tomar el primer ciclo de ACO. De lo contrario usted puede:   Comenzar el da de inicio del ciclo menstrual. No necesitar proteccin anticonceptiva adicional al comenzar en este momento.   Comenzar el primer domingo luego de su perodo menstrual, o el da en que adquiere el medicamento. En estos casos deber tener proteccin anticonceptiva adicional durante los primeros 7 das del ciclo.   Comenzar a tomarlos en cualquier momento del ciclo. Si toma el anticonceptivo dentro de los 5 das de iniciado el perodo, estar protegida de quedar embarazada inmediatamente. En este caso, no necesitar una forma adicional de  anticonceptivos. Si comienza en cualquier otro momento del ciclo menstrual, necesitar usar otra forma de anticonceptivo durante 7 das. Si sus ACO son del tipo de los llamados minipldoras, podrn impedir el embarazo despus de tomarlas por 2 das (48 horas). Luego de comenzar a tomar los ACO:   Si olvid de tomar 1 pldora, tmela tan pronto como lo recuerde. Tome la siguiente pldora a la hora habitual.   Si dej de tomar 2 o ms pldoras, comunquese con su mdico ya que diferentes pldoras tienen diferentes instrucciones para las dosis que no se han tomado. Si olvida tomar 2 o ms pldoras, utilice un mtodo anticonceptivo adicional hasta que comience su prximo perodo menstrual.   Si utiliza el envase de 28 pldoras que contienen pldoras inactivas y olvida tomar 1 de las ltimas 7 (pldoras sin hormonas), sto no tiene importancia. Simplemente deseche el resto de las pldoras que no contienen hormonas y comience un nuevo envase.  No importa cuando comience a tomar los anticonceptivos, siempre empiece un nuevo envase el mismo da de la semana. Tenga un envase extra de ACO y use un mtodo anticonceptivo adicional para el caso en que se olvide de tomar algunas pldoras o pierda la caja.  INSTRUCCIONES PARA EL CUIDADO EN EL HOGAR   No fume.   Use siempre un condn para protegerse contra las enfermedades de transmisin sexual. Los ACO no protegen contra las enfermedades de transmisin sexual.   Use un almanaque para marcar los das de su perodo menstrual.   Lea la informacin y consejos que vienen con las ACO. Hable con   el profesional si tiene dudas.  SOLICITE ATENCIN MDICA SI:   Presenta nuseas o vmitos.   Tiene flujo o sangrado vaginal anormal.   Aparece una erupcin cutnea.   No tiene el perodo menstrual.   Pierde el cabello.   Necesita tratamiento por cambios en su estado de nimo o por depresin.   Se siente mareada al [employer].   Comienza a  aparecer acn con el uso de los ACO.   Ardelle Anton.  SOLICITE ATENCIN MDICA DE INMEDIATO SI:   Siente dolor en el pecho.   Le falta el aire.   Le duele mucho la cabeza y no puede Human resources officer.   Siente adormecimiento o tiene dificultad para hablar.   Tiene problemas de visin.   Presenta dolor, inflamacin o hinchazn en las piernas.    Esta informacin no tiene Theme park manager el consejo del mdico. Asegrese de hacerle al mdico cualquier pregunta que tenga.   Document Released: 09/01/2011 Document Revised: 05/15/2013 Elsevier Interactive Patient Education [employer]

## 2017-09-22 ENCOUNTER — Encounter (HOSPITAL_COMMUNITY): Payer: Self-pay | Admitting: Emergency Medicine

## 2017-09-22 ENCOUNTER — Other Ambulatory Visit: Payer: Self-pay

## 2017-09-22 DIAGNOSIS — R109 Unspecified abdominal pain: Secondary | ICD-10-CM | POA: Insufficient documentation

## 2017-09-22 DIAGNOSIS — Z5321 Procedure and treatment not carried out due to patient leaving prior to being seen by health care provider: Secondary | ICD-10-CM | POA: Insufficient documentation

## 2017-09-22 LAB — CBC
HCT: 37.7 % (ref 36.0–46.0)
HEMOGLOBIN: 12.3 g/dL (ref 12.0–15.0)
MCH: 28.8 pg (ref 26.0–34.0)
MCHC: 32.6 g/dL (ref 30.0–36.0)
MCV: 88.3 fL (ref 78.0–100.0)
Platelets: 209 10*3/uL (ref 150–400)
RBC: 4.27 MIL/uL (ref 3.87–5.11)
RDW: 12.4 % (ref 11.5–15.5)
WBC: 6.1 10*3/uL (ref 4.0–10.5)

## 2017-09-22 LAB — I-STAT BETA HCG BLOOD, ED (MC, WL, AP ONLY)

## 2017-09-22 LAB — URINALYSIS, ROUTINE W REFLEX MICROSCOPIC
BACTERIA UA: NONE SEEN
Bilirubin Urine: NEGATIVE
Glucose, UA: NEGATIVE mg/dL
KETONES UR: NEGATIVE mg/dL
Nitrite: NEGATIVE
PH: 7 (ref 5.0–8.0)
PROTEIN: NEGATIVE mg/dL
Specific Gravity, Urine: 1.026 (ref 1.005–1.030)

## 2017-09-22 LAB — BASIC METABOLIC PANEL
ANION GAP: 11 (ref 5–15)
BUN: 8 mg/dL (ref 6–20)
CALCIUM: 8.7 mg/dL — AB (ref 8.9–10.3)
CO2: 25 mmol/L (ref 22–32)
Chloride: 100 mmol/L — ABNORMAL LOW (ref 101–111)
Creatinine, Ser: 0.93 mg/dL (ref 0.44–1.00)
Glucose, Bld: 126 mg/dL — ABNORMAL HIGH (ref 65–99)
Potassium: 3.6 mmol/L (ref 3.5–5.1)
Sodium: 136 mmol/L (ref 135–145)

## 2017-09-22 MED ORDER — ONDANSETRON 4 MG PO TBDP
4.0000 mg | ORAL_TABLET | Freq: Once | ORAL | Status: AC
  Administered 2017-09-22: 4 mg via ORAL
  Filled 2017-09-22: qty 1

## 2017-09-22 MED ORDER — OXYCODONE-ACETAMINOPHEN 5-325 MG PO TABS
1.0000 | ORAL_TABLET | ORAL | Status: DC | PRN
  Administered 2017-09-22: 1 via ORAL
  Filled 2017-09-22: qty 1

## 2017-09-22 NOTE — ED Triage Notes (Signed)
Reports right flank pain and urinary frequency, urgency, and burning.  Has not taken anything for pain.  Denies having any n/v.

## 2017-09-23 ENCOUNTER — Emergency Department (HOSPITAL_BASED_OUTPATIENT_CLINIC_OR_DEPARTMENT_OTHER)
Admission: EM | Admit: 2017-09-23 | Discharge: 2017-09-23 | Disposition: A | Discharge status: Home / Self Care | Payer: Self-pay | Attending: Emergency Medicine | Admitting: Emergency Medicine

## 2017-09-23 ENCOUNTER — Encounter (HOSPITAL_BASED_OUTPATIENT_CLINIC_OR_DEPARTMENT_OTHER): Payer: Self-pay

## 2017-09-23 ENCOUNTER — Emergency Department (HOSPITAL_BASED_OUTPATIENT_CLINIC_OR_DEPARTMENT_OTHER): Payer: Self-pay

## 2017-09-23 ENCOUNTER — Other Ambulatory Visit: Payer: Self-pay

## 2017-09-23 ENCOUNTER — Emergency Department (HOSPITAL_COMMUNITY)
Admission: EM | Admit: 2017-09-23 | Discharge: 2017-09-23 | Disposition: A | Discharge status: Home / Self Care | Payer: Self-pay | Attending: Emergency Medicine | Admitting: Emergency Medicine

## 2017-09-23 DIAGNOSIS — N200 Calculus of kidney: Secondary | ICD-10-CM | POA: Insufficient documentation

## 2017-09-23 DIAGNOSIS — Z79899 Other long term (current) drug therapy: Secondary | ICD-10-CM | POA: Insufficient documentation

## 2017-09-23 MED ORDER — MORPHINE SULFATE (PF) 4 MG/ML IV SOLN
4.0000 mg | Freq: Once | INTRAVENOUS | Status: AC
  Administered 2017-09-23: 4 mg via INTRAVENOUS
  Filled 2017-09-23: qty 1

## 2017-09-23 MED ORDER — OXYCODONE-ACETAMINOPHEN 5-325 MG PO TABS: 1.0000 | ORAL_TABLET | Freq: Four times a day (QID) | ORAL | 0 refills | Status: DC | PRN

## 2017-09-23 MED ORDER — KETOROLAC TROMETHAMINE 30 MG/ML IJ SOLN
30.0000 mg | Freq: Once | INTRAMUSCULAR | Status: AC
  Administered 2017-09-23: 30 mg via INTRAVENOUS
  Filled 2017-09-23: qty 1

## 2017-09-23 MED ORDER — ONDANSETRON HCL 4 MG/2ML IJ SOLN
4.0000 mg | Freq: Once | INTRAMUSCULAR | Status: AC
  Administered 2017-09-23: 4 mg via INTRAVENOUS
  Filled 2017-09-23: qty 2

## 2017-09-23 NOTE — ED Triage Notes (Signed)
Pt presents via [facility], reports left flank pain and abdominal pain. Pt reports nausea, vomiting, urinary frequency, pressure, and burning. Pt was seen at [facility] earlier today. LWBS after triage. Pt had blood work and urine collected.

## 2017-09-23 NOTE — ED Notes (Signed)
Pt stated she would go to a clinic in the morning and that they couldn't wait.

## 2017-09-23 NOTE — Discharge Instructions (Signed)
Percocet as prescribed as needed for pain.  Follow-up with [facility] if you are not improving in the next 3-4 days.  Their contact information has been provided in this discharge summary for you to call and make these arrangements.

## 2017-09-23 NOTE — ED Provider Notes (Signed)
[facility] EMERGENCY DEPARTMENT Provider Note   CSN: [id number] Arrival date & time: 09/23/17  0205     History   Chief Complaint Chief Complaint  Patient presents with  . Flank Pain    HPI [name] Li is a 36 y.o. female.  Patient is a 36 year old female with no significant past medical history.  She presents today for evaluation of right flank pain radiating into the right groin.  This started yesterday and is rapidly worsening.  She has had UTIs in the past but this feels different.  She does report some burning with urination, however has no fever.  She denies any bowel complaints.   The history is provided by the patient.  Flank Pain  This is a new problem. The current episode started yesterday. The problem occurs constantly. The problem has been rapidly worsening. Associated symptoms include abdominal pain. Nothing aggravates the symptoms. Nothing relieves the symptoms. She has tried nothing for the symptoms.    Past Medical History:  Diagnosis Date  . Gestational diabetes    glyburide    Patient Active Problem List   Diagnosis Date Noted  . Acute blood loss anemia 07/12/2016  . Labor and delivery, indication for care 07/03/2016  . GBS (group B Streptococcus carrier), +RV culture, currently pregnant 06/17/2016  . Rh negative, antepartum 01/11/2016  . Obesity affecting pregnancy, antepartum 01/11/2016  . Supervision of high risk pregnancy, antepartum 01/02/2016  . Gestational diabetes mellitus (GDM), antepartum 01/02/2016    Past Surgical History:  Procedure Laterality Date  . NO PAST SURGERIES      OB History    Gravida Para Term Preterm AB Living   3 3 3     3    SAB TAB Ectopic Multiple Live Births         0 3       Home Medications    Prior to Admission medications   Medication Sig Start Date End Date Taking? Authorizing Provider  ferrous sulfate 325 (65 FE) MG tablet Take 1 tablet (325 mg total) by mouth 2 (two) times daily  with a meal. 07/06/16  Yes Lorne SkeensSchenk, Nicholas Michael, MD  norgestimate-ethinyl estradiol (ORTHO-CYCLEN,SPRINTEC,PREVIFEM) 0.25-35 MG-MCG tablet Take 1 tablet by mouth daily. 08/02/16  Yes [place], Hiram ComberElizabeth Woodland, DO  Prenatal Vit-Fe Fumarate-FA (PRENATAL MULTIVITAMIN) TABS tablet Take 1 tablet by mouth at bedtime.    Yes [provider]  senna-docusate (SENOKOT-S) 8.6-50 MG tablet Take 2 tablets by mouth daily. 07/07/16  Yes Lorne SkeensSchenk, Nicholas Michael, MD    Family History Family History  Problem Relation Age of Onset  . Mental illness Mother        depression and OCD - sees psychiatrist  . Multiple births Mother        pregnant with twins but lost them  . Hypertension Father   . Diabetes Father   . Heart disease Father   . Diabetes Brother   . Thyroid disease Brother     Social History Social History   Tobacco Use  . Smoking status: Never Smoker  . Smokeless tobacco: Never Used  Substance Use Topics  . Alcohol use: No  . Drug use: No     Allergies   Patient has no known allergies.   Review of Systems Review of Systems  Gastrointestinal: Positive for abdominal pain.  Genitourinary: Positive for flank pain.  All other systems reviewed and are negative.    Physical Exam Updated Vital Signs BP 116/72 (BP Location: Right Arm)   Pulse  92   Temp 97.8 F (36.6 C) (Oral)   Resp 18   Ht 5\' 6"  (1.676 m)   Wt 99.8 kg (220 lb)   SpO2 99%   BMI 35.51 kg/m   Physical Exam  Constitutional: She is oriented to person, place, and time. She appears well-developed and well-nourished. No distress.  HENT:  Head: Normocephalic and atraumatic.  Neck: Normal range of motion. Neck supple.  Cardiovascular: Normal rate and regular rhythm. Exam reveals no gallop and no friction rub.  No murmur heard. Pulmonary/Chest: Effort normal and breath sounds normal. No respiratory distress. She has no wheezes.  Abdominal: Soft. Bowel sounds are normal. She exhibits no distension.  There is tenderness. There is no rebound and no guarding.  There is tenderness to palpation in the right flank and right mid abdomen.  Musculoskeletal: Normal range of motion.  Neurological: She is alert and oriented to person, place, and time.  Skin: Skin is warm and dry. She is not diaphoretic.  Nursing note and vitals reviewed.    ED Treatments / Results  Labs (all labs ordered are listed, but only abnormal results are displayed) Labs Reviewed - No data to display  EKG  EKG Interpretation None       Radiology No results found.  Procedures Procedures (including critical care time)  Medications Ordered in ED Medications  ondansetron (ZOFRAN) injection 4 mg (not administered)  morphine 4 MG/ML injection 4 mg (not administered)  ketorolac (TORADOL) 30 MG/ML injection 30 mg (not administered)     Initial Impression / Assessment and Plan / ED Course  I have reviewed the triage vital signs and the nursing notes.  Pertinent labs & imaging results that were available during my care of the patient were reviewed by me and considered in my medical decision making (see chart for details).  CT scan reveals a 3 mm stone in the right distal ureter.  She is feeling better after medications given in the ER.  She will be discharged with pain medicine and follow-up with urology if not improving.  Final Clinical Impressions(s) / ED Diagnoses   Final diagnoses:  None    ED Discharge Orders    None       Geoffery Lyons, MD 09/23/17 [phone]

## 2017-09-23 NOTE — ED Notes (Signed)
Patient transported to CT 

## 2018-01-03 ENCOUNTER — Emergency Department (HOSPITAL_COMMUNITY): Payer: Self-pay

## 2018-01-03 ENCOUNTER — Encounter (HOSPITAL_COMMUNITY): Payer: Self-pay | Admitting: Emergency Medicine

## 2018-01-03 ENCOUNTER — Emergency Department (HOSPITAL_COMMUNITY)
Admission: EM | Admit: 2018-01-03 | Discharge: 2018-01-03 | Disposition: A | Discharge status: Home / Self Care | Payer: Self-pay | Attending: Emergency Medicine | Admitting: Emergency Medicine

## 2018-01-03 DIAGNOSIS — M5442 Lumbago with sciatica, left side: Secondary | ICD-10-CM | POA: Insufficient documentation

## 2018-01-03 DIAGNOSIS — R0789 Other chest pain: Secondary | ICD-10-CM | POA: Insufficient documentation

## 2018-01-03 DIAGNOSIS — G5603 Carpal tunnel syndrome, bilateral upper limbs: Secondary | ICD-10-CM | POA: Insufficient documentation

## 2018-01-03 DIAGNOSIS — Z79899 Other long term (current) drug therapy: Secondary | ICD-10-CM | POA: Insufficient documentation

## 2018-01-03 LAB — URINALYSIS, ROUTINE W REFLEX MICROSCOPIC
Bilirubin Urine: NEGATIVE
GLUCOSE, UA: NEGATIVE mg/dL
HGB URINE DIPSTICK: NEGATIVE
Ketones, ur: 80 mg/dL — AB
Leukocytes, UA: NEGATIVE
Nitrite: NEGATIVE
PH: 9 — AB (ref 5.0–8.0)
Protein, ur: NEGATIVE mg/dL
SPECIFIC GRAVITY, URINE: 1.006 (ref 1.005–1.030)

## 2018-01-03 LAB — CBC WITH DIFFERENTIAL/PLATELET
Basophils Absolute: 0 10*3/uL (ref 0.0–0.1)
Basophils Relative: 0 %
EOS ABS: 0 10*3/uL (ref 0.0–0.7)
Eosinophils Relative: 1 %
HEMATOCRIT: 43.4 % (ref 36.0–46.0)
HEMOGLOBIN: 14.5 g/dL (ref 12.0–15.0)
Lymphocytes Relative: 21 %
Lymphs Abs: 1.7 10*3/uL (ref 0.7–4.0)
MCH: 29.5 pg (ref 26.0–34.0)
MCHC: 33.4 g/dL (ref 30.0–36.0)
MCV: 88.2 fL (ref 78.0–100.0)
Monocytes Absolute: 0.3 10*3/uL (ref 0.1–1.0)
Monocytes Relative: 4 %
NEUTROS PCT: 74 %
Neutro Abs: 6.1 10*3/uL (ref 1.7–7.7)
Platelets: 238 10*3/uL (ref 150–400)
RBC: 4.92 MIL/uL (ref 3.87–5.11)
RDW: 12.4 % (ref 11.5–15.5)
WBC: 8.1 10*3/uL (ref 4.0–10.5)

## 2018-01-03 LAB — LIPASE, BLOOD: Lipase: 27 U/L (ref 11–51)

## 2018-01-03 LAB — COMPREHENSIVE METABOLIC PANEL
ALBUMIN: 4.4 g/dL (ref 3.5–5.0)
ALK PHOS: 52 U/L (ref 38–126)
ALT: 26 U/L (ref 14–54)
ANION GAP: 9 (ref 5–15)
AST: 20 U/L (ref 15–41)
BUN: 10 mg/dL (ref 6–20)
CALCIUM: 9.4 mg/dL (ref 8.9–10.3)
CO2: 24 mmol/L (ref 22–32)
Chloride: 106 mmol/L (ref 101–111)
Creatinine, Ser: 0.64 mg/dL (ref 0.44–1.00)
GFR calc Af Amer: >60 mL/min (ref >60)
GFR calc non Af Amer: >60 mL/min (ref >60)
Glucose, Bld: 97 mg/dL (ref 65–99)
Potassium: 4.1 mmol/L (ref 3.5–5.1)
SODIUM: 139 mmol/L (ref 135–145)
Total Bilirubin: 1.2 mg/dL (ref 0.3–1.2)
Total Protein: 8.1 g/dL (ref 6.5–8.1)

## 2018-01-03 LAB — RAPID URINE DRUG SCREEN, HOSP PERFORMED
AMPHETAMINES: NOT DETECTED
BARBITURATES: NOT DETECTED
Benzodiazepines: NOT DETECTED
Cocaine: NOT DETECTED
Opiates: NOT DETECTED
TETRAHYDROCANNABINOL: NOT DETECTED

## 2018-01-03 LAB — POC URINE PREG, ED: PREG TEST UR: NEGATIVE

## 2018-01-03 LAB — I-STAT TROPONIN, ED: TROPONIN I, POC: 0 ng/mL (ref 0.00–0.08)

## 2018-01-03 MED ORDER — METHYLPREDNISOLONE 4 MG PO TBPK: ORAL_TABLET | ORAL | 0 refills | Status: DC

## 2018-01-03 MED ORDER — METHOCARBAMOL 500 MG PO TABS: 500.0000 mg | ORAL_TABLET | Freq: Two times a day (BID) | ORAL | 0 refills | Status: DC

## 2018-01-03 MED ORDER — LORAZEPAM 1 MG PO TABS
1.0000 mg | ORAL_TABLET | Freq: Once | ORAL | Status: AC
Start: 2018-01-03 — End: 2018-01-03
  Administered 2018-01-03: 1 mg via ORAL
  Filled 2018-01-03: qty 1

## 2018-01-03 MED ORDER — NAPROXEN 500 MG PO TABS: 500.0000 mg | ORAL_TABLET | Freq: Two times a day (BID) | ORAL | 0 refills | Status: DC

## 2018-01-03 NOTE — ED Provider Notes (Signed)
[facility]-EMERGENCY DEPT Provider Note   CSN: [id number] Arrival date & time: 01/03/18  1647     History   Chief Complaint Chief Complaint  Patient presents with  . Numbness  . Tingling  . Anxiety  . Back Pain    HPI [name] Li is a 37 y.o. female with a history of gestational diabetes who presents the emergency department today for multiple complaints.  Numbness/tingling Hands Patient reports that over the last 2 months she has been having intermittent episodes of pain, numbness/tingling of the palmar aspect of the thumb, index, middle and radial aspect of ring finger that commonly occur in the morning after awakening or after long hours of working at the [employer].  She reports that the pain is typically relieved by rest.  She has taken anti-inflammatory medications for her symptoms without relief.  The patient denies any proximal numbness/tingling, neck pain, visual changes, trauma, joint swelling, joint erythema.  Back Pain Patient reports that one month ago she was doing yard work and when pulling weeds she felt a strain of her left lower back.  The next morning when she awoke she had extreme pain of the left lower back.  She reports that since that time the area stayed dull, achy.  She reports that she has a numbness/tingling that radiates down her left lower leg to her calf and foot.  She reports that her symptoms are typically worsened with walking, bending and picking up of heavy objects including her 31-month-old child.  She reports she has taken anti-inflammatory medications for this without relief.  She states she has a history of kidney stones but feels this is different. Denies history of cancer, trauma, fever, night pain, IV drug use, recent spinal manipulation or procedures, weakness of the lower extremities, urinary retention, loss of bowel/bladder function, saddle anesthesia, or unexplained weight loss. Denies dysuria, flank pain,  suprapubic pain, frequency, urgency, or hematuria. States this feels different then when she had a kidney stone.   Chest pain Patient reports that she has substernal chest pain without radiation that began earlier this morning after the patient picked up her child from school.  She notes that prior to that she was very anxious about receiving lab results back from her doctor.  She notes that the pain is constant and describes it as a tingling sensation.  She feels like this is worse with positional changes.  She denies any preceding URI symptoms.  Patient states the pain is constant and does not radiate into her neck, jaw, shoulders, back or abdomen.  She denies any associated nausea, emesis, diaphoresis or shortness of breath.  She has not take anything for symptoms.  She denies history of MI, CVA, TIA, diabetes, hyperlipidemia, or hypertension.  The patient reports that she does have a family history of heart disease including her father who has had 2 CVAs.  She denies prior workup including echocardiogram, stress test or heart catheterization for chest pain.  Patient is a never smoker.  She denies any alcohol or drug use. Denies risk factors for DVT/PE including exogenous estrogen use, recent surgery or travel, trauma, immobilization, smoking, previous blood clot, cough, hemoptysis, cancer, lower extremity pain or swelling, or family history of bleeding/clotting disorder.    HPI  Past Medical History:  Diagnosis Date  . Gestational diabetes    glyburide    Patient Active Problem List   Diagnosis Date Noted  . Acute blood loss anemia 07/12/2016  . Labor and delivery, indication  for care 07/03/2016  . GBS (group B Streptococcus carrier), +RV culture, currently pregnant 06/17/2016  . Rh negative, antepartum 01/11/2016  . Obesity affecting pregnancy, antepartum 01/11/2016  . Supervision of high risk pregnancy, antepartum 01/02/2016  . Gestational diabetes mellitus (GDM), antepartum 01/02/2016     Past Surgical History:  Procedure Laterality Date  . NO PAST SURGERIES       OB History    Gravida  3   Para  3   Term  3   Preterm      AB      Living  3     SAB      TAB      Ectopic      Multiple  0   Live Births  3            Home Medications    Prior to Admission medications   Medication Sig Start Date End Date Taking? Authorizing Provider  ferrous sulfate 325 (65 FE) MG tablet Take 1 tablet (325 mg total) by mouth 2 (two) times daily with a meal. 07/06/16   Lorne Skeens, MD  norgestimate-ethinyl estradiol (ORTHO-CYCLEN,SPRINTEC,PREVIFEM) 0.25-35 MG-MCG tablet Take 1 tablet by mouth daily. 08/02/16   Mumaw, Hiram Comber, DO  oxyCODONE-acetaminophen (PERCOCET/ROXICET) 5-325 MG tablet Take 1-2 tablets by mouth every 6 (six) hours as needed for severe pain. 09/23/17   Geoffery Lyons, MD  Prenatal Vit-Fe Fumarate-FA (PRENATAL MULTIVITAMIN) TABS tablet Take 1 tablet by mouth at bedtime.     [provider]  senna-docusate (SENOKOT-S) 8.6-50 MG tablet Take 2 tablets by mouth daily. 07/07/16   Lorne Skeens, MD    Family History Family History  Problem Relation Age of Onset  . Mental illness Mother        depression and OCD - sees psychiatrist  . Multiple births Mother        pregnant with twins but lost them  . Hypertension Father   . Diabetes Father   . Heart disease Father   . Diabetes Brother   . Thyroid disease Brother     Social History Social History   Tobacco Use  . Smoking status: Never Smoker  . Smokeless tobacco: Never Used  Substance Use Topics  . Alcohol use: No  . Drug use: No     Allergies   Patient has no known allergies.   Review of Systems Review of Systems  All other systems reviewed and are negative.    Physical Exam Updated Vital Signs BP 113/82 (BP Location: Left Arm)   Pulse 69   Temp 98.2 F (36.8 C) (Oral)   Resp 18   LMP 11/28/2017   SpO2 100%   Physical Exam   Constitutional: She appears well-developed and well-nourished. No distress.  Non-toxic appearing  HENT:  Head: Normocephalic and atraumatic.  Right Ear: External ear normal.  Left Ear: External ear normal.  Nose: Nose normal.  Mouth/Throat: Uvula is midline, oropharynx is clear and moist and mucous membranes are normal. No tonsillar exudate.  Eyes: Pupils are equal, round, and reactive to light. Right eye exhibits no discharge. Left eye exhibits no discharge. No scleral icterus.  Neck: Trachea normal and normal range of motion. Neck supple. No JVD present. No spinous process tenderness and no muscular tenderness present. Carotid bruit is not present. No neck rigidity. Normal range of motion present.  Cardiovascular: Normal rate, regular rhythm, normal heart sounds and intact distal pulses.  No murmur heard. Pulses:  Radial pulses are 2+ on the right side, and 2+ on the left side.       Femoral pulses are 2+ on the right side, and 2+ on the left side.      Dorsalis pedis pulses are 2+ on the right side, and 2+ on the left side.       Posterior tibial pulses are 2+ on the right side, and 2+ on the left side.  No lower extremity swelling or edema. Calves symmetric in size bilaterally.  Pulmonary/Chest: Effort normal and breath sounds normal. No respiratory distress. She exhibits no tenderness.  No increased work of breathing. No accessory muscle use. Patient is sitting upright, speaking in full sentences without difficulty   Abdominal: Soft. Bowel sounds are normal. She exhibits no pulsatile midline mass. There is no tenderness. There is no rigidity, no rebound, no guarding and no CVA tenderness.  Musculoskeletal: She exhibits no edema.       Right wrist: Normal.       Left wrist: Normal.       Right hand: Decreased sensation noted. Normal strength noted.       Left hand: Normal. She exhibits normal range of motion, no tenderness, no bony tenderness, normal two-point discrimination,  normal capillary refill, no deformity, no laceration and no swelling. Normal sensation noted. Normal strength noted.  Posterior and appearance appears normal. No evidence of obvious scoliosis or kyphosis. No obvious signs of skin changes, trauma, deformity, infection. No C, T, or L spine tenderness or step-offs to palpation. No C, T paraspinal tenderness. Left paraspinal lumbar TTP. Lung expansion normal. Bilateral lower extremity strength 5 out of 5 including extensor hallucis longus. Patellar and Achilles deep tendon reflex 2+ and equal bilaterally. Sensation of lower extremities grossly intact. Straight leg right neg. Straight leg left pos. Gait able but patient notes painful. Lower extremity compartments soft. PT and DP 2+ b/l. Cap refill <2 seconds.  Positive Tinel's and Phalen's tests bilaterally.  Lymphadenopathy:    She has no cervical adenopathy.  Neurological: She is alert.  Mental Status:  Alert, oriented, thought content appropriate, able to give a coherent history. Speech fluent without evidence of aphasia. Able to follow 2 step commands without difficulty.  Cranial Nerves:  II:  Peripheral visual fields grossly normal, pupils equal, round, reactive to light III,IV, VI: ptosis not present, extra-ocular motions intact bilaterally  V,VII: smile symmetric, eyebrows raise symmetric, facial light touch sensation equal VIII: hearing grossly normal to voice  X: uvula elevates symmetrically  XI: bilateral shoulder shrug symmetric and strong XII: midline tongue extension without fassiculations Motor:  Normal tone. 5/5 in upper and lower extremities bilaterally including strong and equal grip strength and dorsiflexion/plantar flexion. No Foot drop.  Sensory: Sensation intact to light touch in all extremities. Negative Romberg.  Deep Tendon Reflexes: 2+ and symmetric in the biceps and patella Cerebellar: normal finger-to-nose with bilateral upper extremities. Normal heel-to -shin balance  bilaterally of the lower extremity. No pronator drift.  Gait: normal gait and balance CV: distal pulses palpable throughout   Skin: Skin is warm, dry and intact. Capillary refill takes less than 2 seconds. No rash noted. She is not diaphoretic. No erythema.  No vesicular-like rash noted.  Psychiatric: She has a normal mood and affect.  Nursing note and vitals reviewed.    ED Treatments / Results  Labs (all labs ordered are listed, but only abnormal results are displayed) Labs Reviewed  URINALYSIS, ROUTINE W REFLEX MICROSCOPIC - Abnormal; Notable for the  following components:      Result Value   pH 9.0 (*)    Ketones, ur 80 (*)    All other components within normal limits  COMPREHENSIVE METABOLIC PANEL  LIPASE, BLOOD  CBC WITH DIFFERENTIAL/PLATELET  RAPID URINE DRUG SCREEN, HOSP PERFORMED  POC URINE PREG, ED  I-STAT TROPONIN, ED    EKG EKG Interpretation  Date/Time:  Wednesday January 03 2018 19:15:38 EDT Ventricular Rate:  61 PR Interval:    QRS Duration: 95 QT Interval:  426 QTC Calculation: 430 R Axis:   32 Text Interpretation:  Sinus rhythm No old tracing to compare Confirmed by Linwood Dibbles [phone]) on 01/03/2018 7:25:37 PM   Radiology Dg Chest 2 View  Result Date: 01/03/2018 CLINICAL DATA:  Chest pain EXAM: CHEST - 2 VIEW COMPARISON:  None. FINDINGS: The heart size and mediastinal contours are within normal limits. Both lungs are clear. The visualized skeletal structures are unremarkable. IMPRESSION: No active cardiopulmonary disease. Electronically Signed   By: Jasmine Pang M.D.   On: 01/03/2018 18:58    Procedures Procedures (including critical care time)  Medications Ordered in ED Medications  LORazepam (ATIVAN) tablet 1 mg (1 mg Oral Given 01/03/18 1851)     Initial Impression / Assessment and Plan / ED Course  I have reviewed the triage vital signs and the nursing notes.  Pertinent labs & imaging results that were available during my care of the patient  were reviewed by me and considered in my medical decision making (see chart for details).     37 y.o. female with no reported past medical history presenting with multiple complaints.   Numbness/tingling Hands Patient with 2 months of intermittent episodes of burning pain, numbness/tingling of the palmar aspect of her thumb, index, middle and radial aspect of her ring finger that commonly occur in the morning and after long periods of cutting hair.  Patient without any neck pain or proximal weakness.  She denies any trauma.  Exam is not consistent with septic joint.  Patient does have positive Tinel's and Phalen's sign bilaterally.  No thenar eminence atrophy.  She is neurovascular intact.  Suspect symptoms related to carpal tunnel syndrome.  Will give bilateral wrist splints and refer to hand.  Informational handout provided.   Back Pain Patient reports that one month ago she was doing yard work and when pulling weeds she felt a strain of her left lower back and now has pain in the left lower back that radiates into her left leg. It is worse with walking, bending and picking up heavy objects. No neurologic deficits and normal neuro exam.  Patient can walk but states it is painful.  No bowel or bladder incontinence.  No urinary retention or saddle anesthesia.  No concern for cauda equina.  No history of trauma, personal history of cancer, night sweats or weight loss that would warrant a x-ray at this time.  No spinal injections, fever or IV drug use that make me concerned for spinal hematoma or abscess.  No pulsatile mass of the abdomen.  No abdominal tenderness to palpation or guarding to make me concern for intra-abdominal pathology.  No urinary symptoms or CVA tenderness to make me concerned for cystitis versus pyelonephritis.  Patient symptoms atypical for a kidney stone and states this is not similar to prior kidney stone.  Urinalysis without blood in the urine.  Do not suspect kidney stone. Suspect  patient's symptoms are musculoskeletal in nature with evidence of sciatica with positive straight leg  raise.  Will treat the patient with back exercises, activity modification, muscle relaxers, NSAIDs (no history of kidney disease or GI bleed and Cr normal). Will also give steroids for sciatica.  Patient is to follow-up with PCP versus orthopedics.  Strict return precautions discussed.  Patient appears safe for discharge.  Chest pain Patient presented with chest pain to the ED. Patient is to be discharged with recommendation to follow up with PCP in regards to today's hospital visit. Chest pain is not likely of cardiac or pulmonary etiology due to presentation, perc negative, stable vital signs, no tracheal deviation, no JVD or new murmur, RRR, breath sounds equal bilaterally, EKG without acute abnormalities, negative troponin, and negative CXR. HEART score is low risk. Patient has been advised to return to the ED if chest pain becomes exertional, associated with diaphoresis or nausea, radiates to left jaw/arm, worsens or becomes concerning in any way. Patient appears reliable for follow up and is agreeable to discharge. I advised the patient to follow-up with their primary care provider this week. I advised the patient to return to the emergency department with new or worsening symptoms or new concerns. The patient verbalized understanding and agreement with plan. Patient stable   Overall patient's blood work is very reassuring.  Her pregnancy test is negative.  Troponin 0.  EKG normal sinus rhythm.  Chest x-ray without acute cardiopulmonary process.  No electrolyte abnormalities.  Kidney function within normal limits.  Normal LFTs.  No anion gap acidosis.  Lipase within normal limits.  No leukocytosis.  No anemia.  No thrombocytopenia.  UDS negative.  UA without evidence of infection.  The evaluation does not show pathology that would require ongoing emergent intervention or inpatient treatment. I advised  the patient to follow-up with PCP this week. I advised the patient to return to the emergency department with new or worsening symptoms or new concerns. Specific return precautions discussed. The patient verbalized understanding and agreement with plan. All questions answered. No further questions at this time. The patient is hemodynamically stable, mentating appropriately and appears safe for discharge.  Final Clinical Impressions(s) / ED Diagnoses   Final diagnoses:  Atypical chest pain  Acute left-sided low back pain with left-sided sciatica  Bilateral carpal tunnel syndrome    ED Discharge Orders        Ordered    naproxen (NAPROSYN) 500 MG tablet  2 times daily     01/03/18 2039    methylPREDNISolone (MEDROL DOSEPAK) 4 MG TBPK tablet     01/03/18 2039    methocarbamol (ROBAXIN) 500 MG tablet  2 times daily     01/03/18 2039       Princella Pellegrini 01/03/18 2044    Linwood Dibbles, MD 01/03/18 [phone]

## 2018-01-03 NOTE — ED Triage Notes (Signed)
Per [facility] pt "can't feel her body" since Monday and saw her PCP and gave her medications and sent her home. Numbness and tingling all over body, with no neuro deficits. Reported to EMS chest pain 12 lead was sinus rhythm.

## 2018-01-03 NOTE — ED Triage Notes (Signed)
Using Wall-E spanish interpretor, patient states for over week when she moves her head her body hurts. Denies fevers, v/d or urinary problems, some nausea yesterday. Pt went to doctor day before yesterday and had blood work and urine test but hasnt heard results and gave her anti inflammatory and something for nerves/stress reports taking medications at 10am today. Pain in spine and left lower back that started 2 weeks ago.  Hx kidney stone in Jan 2019.

## 2018-01-03 NOTE — ED Notes (Signed)
Bed: [facility]4 Expected date:  Expected time:  Means of arrival:  Comments: 

## 2018-01-03 NOTE — Discharge Instructions (Signed)
Read instructions below for reasons to return to the Emergency Department. It is recommended that your follow up with your Primary Care Doctor in regards to today's visit. If you do not have a doctor, use the resource guide listed below to help you find one.   Tests performed today include: An EKG of your heart A chest x-ray Cardiac enzymes - a blood test for heart muscle damage Blood counts and electrolytes Vital signs. See below for your results today.   1. Chest Pain (Nonspecific)  HOME CARE INSTRUCTIONS  For the next few days, avoid physical activities that bring on chest pain. Continue physical activities as directed.  Do not smoke cigarettes or drink alcohol until your symptoms are gone. If you do smoke, it is time to quit. You may receive instructions and counseling on how to stop smoking. Only take over-the-counter or prescription medicine for pain, discomfort, or fever as directed by your caregiver.  Follow your caregiver's suggestions for further testing if your chest pain does not go away.  Keep any follow-up appointments you made. If you do not go to an appointment, you could develop lasting (chronic) problems with pain. If there is any problem keeping an appointment, you must call to reschedule.  SEEK MEDICAL CARE IF:  You think you are having problems from the medicine you are taking. Read your medicine instructions carefully.  Your chest pain does not go away, even after treatment.  You develop a rash with blisters on your chest.  SEEK IMMEDIATE MEDICAL CARE IF:  You have increased chest pain or pain that spreads to your arm, neck, jaw, back, or belly (abdomen).  You develop shortness of breath, an increasing cough, or you are coughing up blood.  You have severe back or abdominal pain, feel sick to your stomach (nauseous) or throw up (vomit).  You develop severe weakness, fainting, or chills.  You have an oral temperature above 102 F (38.9 C), not controlled by medicine.    THIS IS AN EMERGENCY. Do not wait to see if the pain will go away. Get medical help at once. Call your local emergency services (911 in [place] Do not drive yourself to the hospital.  2. Back Pain: Low back pain is discomfort in the lower back that may be due to injuries to muscles and ligaments around the spine. Occasionally, it may be caused by a problem to a part of the spine called a disc. Your back pain should be treated with medicines listed below as well as back exercises and this back pain should get better over the next 2 weeks. Most patients get completely well in 4 weeks. It is important to know however, if you develop severe or worsening pain, low back pain with fever, numbness, weakness or inability to walk or urinate, you should return to the ER immediately.  Please follow up with your doctor this week for a recheck if still having symptoms.  HOME INSTRUCTIONS Self - care:  The application of heat can help soothe the pain.  Maintaining your daily activities, including walking (this is encouraged), as it will help you get better faster than just staying in bed. Do not life, push, pull anything more than 10 pounds for the next week. I am attaching back exercises that you can do at home to help facilitate your recovery.   Back Exercises - I have attached a handout on back exercises that can be done at home to help facilitate your recovery.   Medications are also  useful to help with pain control.   Acetaminophen.  This medication is generally safe, and found over the counter. Take as directed for your age. You should not take more than 8 of the extra strength (500mg ) pills a day (max dose is 4000mg  total OVER one day)  Non steroidal anti inflammatory: This includes medications including Ibuprofen, naproxen and Mobic; These medications help both pain and swelling and are very useful in treating back pain.  They should be taken with food, as they can cause stomach upset, and more seriously,  stomach bleeding. Do not combine the medications.   Muscle relaxants:  These medications can help with muscle tightness that is a cause of lower back pain.  Most of these medications can cause drowsiness, and it is not safe to drive or use dangerous machinery while taking them. They are primarily helpful when taken at night before sleep.  Medrol Dose Pack - Take as prescribed. Call your pharmacist if you have any questions.  You will need to follow up with your primary healthcare provider in 1-2 weeks for reassessment and persistent symptoms.  Be aware that if you develop new symptoms, such as a fever, leg weakness, difficulty with or loss of control of your urine or bowels, abdominal pain, or more severe pain, you will need to seek medical attention and/or return to the Emergency department.  3. Carpal Tunnel You were also found to have carpal tunnel. Please use splints throughout the day and at night. Follow attached handouts. Follow up with hand for further evaluation.   Additional Information: Do not take medications if you are lactating.   Your vital signs today were: BP 113/82 (BP Location: Left Arm)    Pulse 69    Temp 98.2 F (36.8 C) (Oral)    Resp 18    LMP 11/28/2017    SpO2 100%  If your blood pressure (BP) was elevated above 135/85 this visit, please have this repeated by your doctor within one month. ---------------

## 2018-09-13 ENCOUNTER — Other Ambulatory Visit: Payer: Self-pay | Admitting: Internal Medicine

## 2018-09-13 DIAGNOSIS — M545 Low back pain: Principal | ICD-10-CM

## 2018-09-13 DIAGNOSIS — G8929 Other chronic pain: Secondary | ICD-10-CM

## 2018-10-17 ENCOUNTER — Ambulatory Visit
Admission: RE | Admit: 2018-10-17 | Discharge: 2018-10-17 | Disposition: A | Discharge status: Home / Self Care | Payer: Self-pay | Source: Ambulatory Visit | Attending: Internal Medicine | Admitting: Internal Medicine

## 2018-10-17 DIAGNOSIS — M545 Low back pain, unspecified: Secondary | ICD-10-CM

## 2018-10-17 DIAGNOSIS — G8929 Other chronic pain: Secondary | ICD-10-CM

## 2018-10-27 ENCOUNTER — Ambulatory Visit
Admission: RE | Admit: 2018-10-27 | Discharge: 2018-10-27 | Disposition: A | Discharge status: Home / Self Care | Payer: Self-pay | Source: Ambulatory Visit | Attending: Internal Medicine | Admitting: Internal Medicine

## 2018-11-15 ENCOUNTER — Other Ambulatory Visit: Payer: Self-pay | Admitting: Internal Medicine

## 2018-11-15 DIAGNOSIS — G379 Demyelinating disease of central nervous system, unspecified: Secondary | ICD-10-CM

## 2018-11-30 ENCOUNTER — Ambulatory Visit
Admission: RE | Admit: 2018-11-30 | Discharge: 2018-11-30 | Disposition: A | Discharge status: Home / Self Care | Payer: No Typology Code available for payment source | Source: Ambulatory Visit | Attending: Internal Medicine | Admitting: Internal Medicine

## 2018-11-30 DIAGNOSIS — G379 Demyelinating disease of central nervous system, unspecified: Secondary | ICD-10-CM

## 2018-11-30 MED ORDER — GADOBENATE DIMEGLUMINE 529 MG/ML IV SOLN
20.0000 mL | Freq: Once | INTRAVENOUS | Status: AC | PRN
  Administered 2018-11-30: 20 mL via INTRAVENOUS

## 2018-12-03 ENCOUNTER — Ambulatory Visit
Admission: RE | Admit: 2018-12-03 | Discharge: 2018-12-03 | Disposition: A | Discharge status: Home / Self Care | Payer: Self-pay | Source: Ambulatory Visit | Attending: Internal Medicine | Admitting: Internal Medicine

## 2018-12-03 DIAGNOSIS — G379 Demyelinating disease of central nervous system, unspecified: Secondary | ICD-10-CM

## 2018-12-03 MED ORDER — GADOBENATE DIMEGLUMINE 529 MG/ML IV SOLN
20.0000 mL | Freq: Once | INTRAVENOUS | Status: AC | PRN
  Administered 2018-12-03: 20 mL via INTRAVENOUS

## 2019-04-12 IMAGING — MR MRI HEAD WITHOUT AND WITH CONTRAST
12 series · 45 of 48 positions shown · IV contrast (multihance)
Comparison: None.

CLINICAL DATA: Demyelinating disorder, new diagnosis.

EXAM:
MRI HEAD WITHOUT AND WITH CONTRAST
TECHNIQUE: Multiplanar, multiecho pulse sequences of the brain and surrounding
structures were obtained without and with intravenous contrast.
CONTRAST:  20mL MULTIHANCE GADOBENATE DIMEGLUMINE 529 MG/ML IV SOLN

[Series 2: T1 · sagittal · 5.0mm · 0.45mm/px · 1 of 25 slices shown]
[im 1/25]
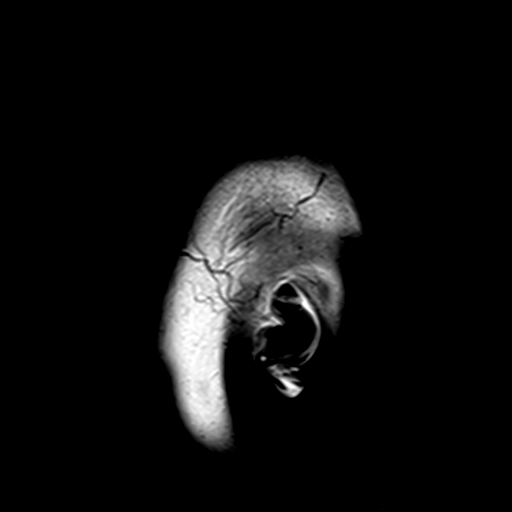

[Series 3: FLAIR · sagittal · 5.0mm · 0.45mm/px · 2 of 30 slices shown (1 of 2)]
[im 1/30]
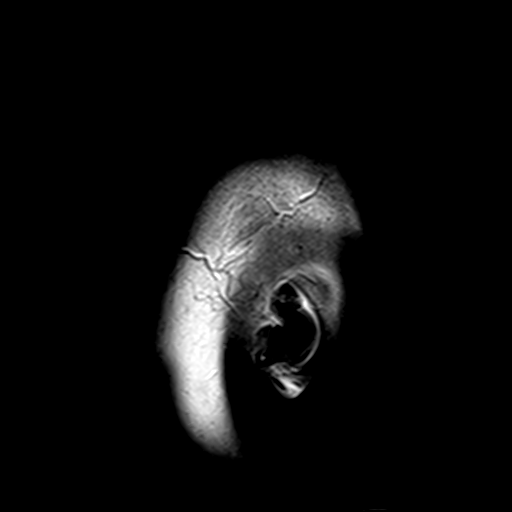
[im 30/30]
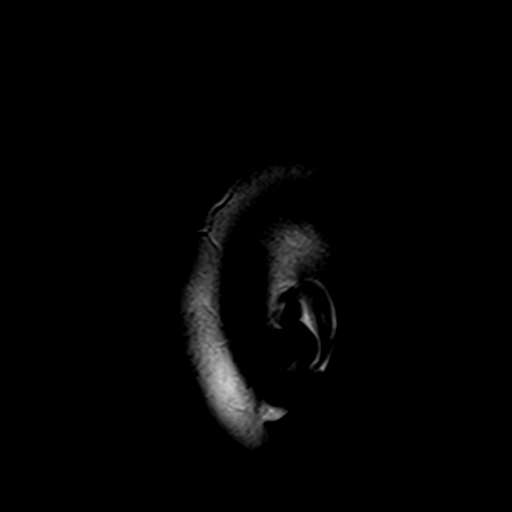

[Series 4: DWI · axial · 3.0mm · 1.80mm/px · z∈[-55,+94]mm · 7 of 102 slices shown (1 of 2)]
[im 1/102]
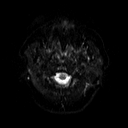
[im 17/102]
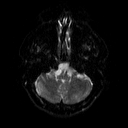
[im 34/102]
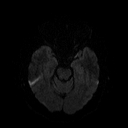
[im 51/102]
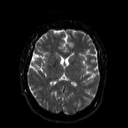
[im 68/102]
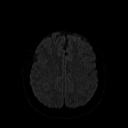
[im 85/102]
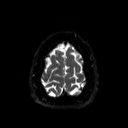
[im 102/102]
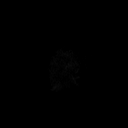

[Series 5: DWI · axial · 3.0mm · 1.80mm/px · z∈[-55,+94]mm · 4 of 51 slices shown (2 of 2)]
[im 1/51]
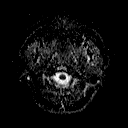
[im 17/51]
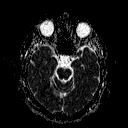
[im 34/51]
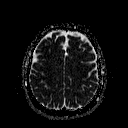
[im 51/51]
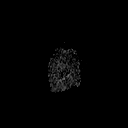

[Series 6: FLAIR · axial · 3.0mm · 0.45mm/px · z∈[-55,+94]mm · 2 of 33 slices shown (2 of 2)]
[im 1/33]
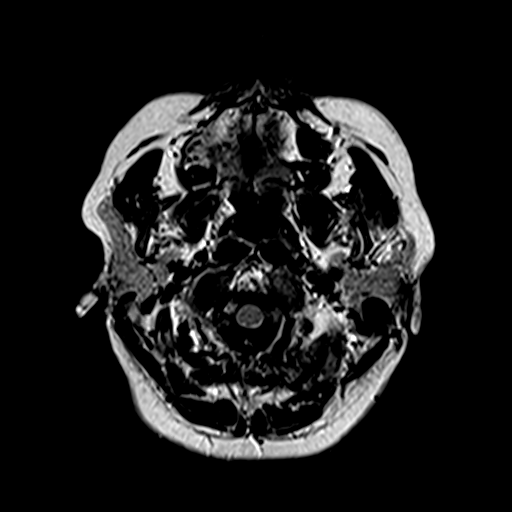
[im 33/33]
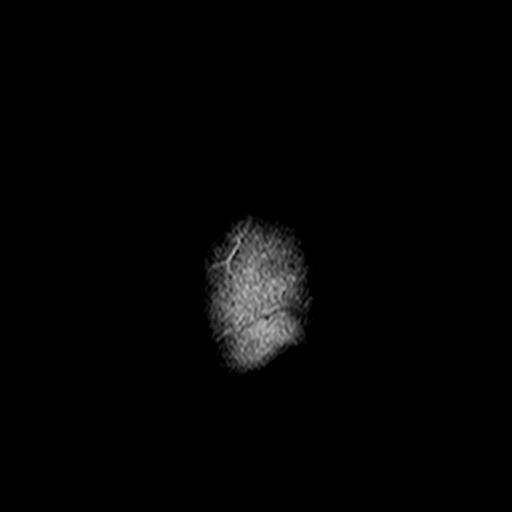

[Series 7: T2 · axial · 5.0mm · 0.60mm/px · z∈[-53,+95]mm · 2 of 23 slices shown (1 of 2)]
[im 1/23]
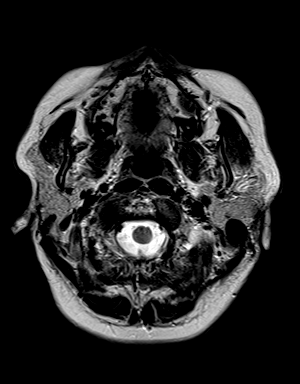
[im 23/23]
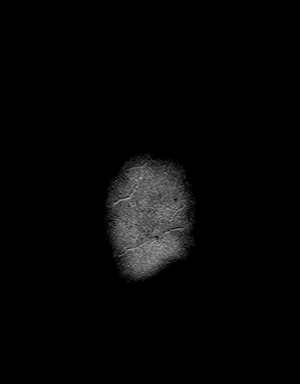

[Series 9: swi_images · axial · 5.0mm · 0.90mm/px · z∈[-51,+103]mm · 2 of 32 slices shown]
[im 1/32]
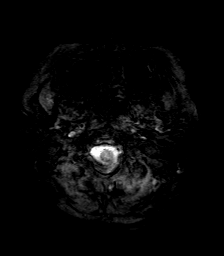
[im 32/32]
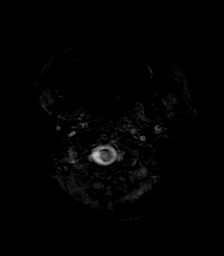

[Series 10: t1_mpr_tra · axial · 1.0mm · 0.72mm/px · z∈[-58,+100]mm · 11 of 160 slices shown (1 of 2)]
[im 1/160]
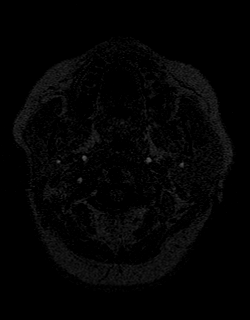
[im 16/160]
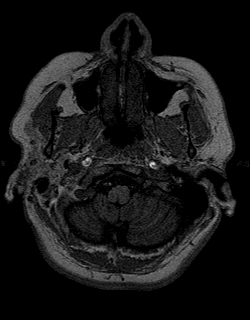
[im 32/160]
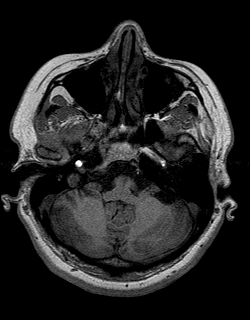
[im 48/160]
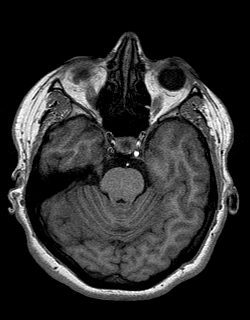
[im 64/160]
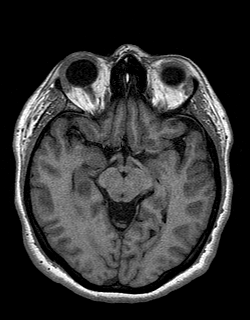
[im 80/160]
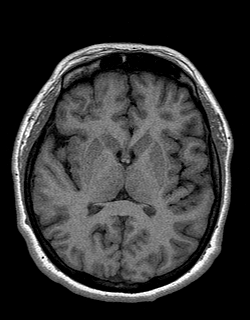
[im 96/160]
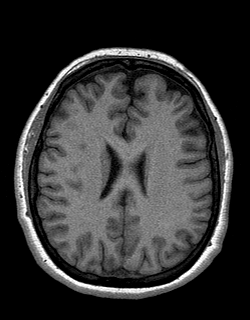
[im 112/160]
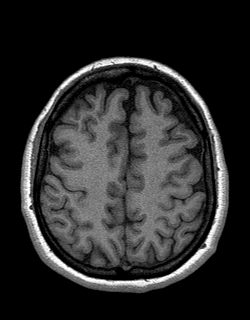
[im 128/160]
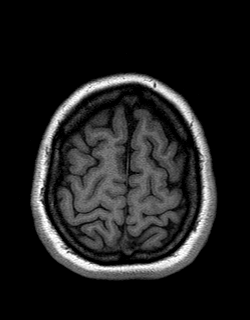
[im 144/160]
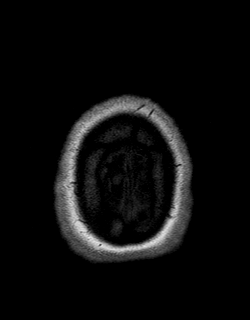
[im 160/160]
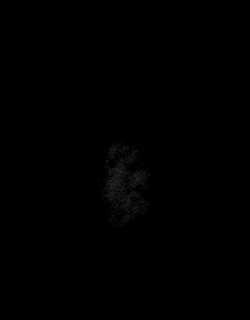

[Series 11: T2 · coronal · 5.0mm · 0.45mm/px · 2 of 27 slices shown (2 of 2)]
[im 1/27]
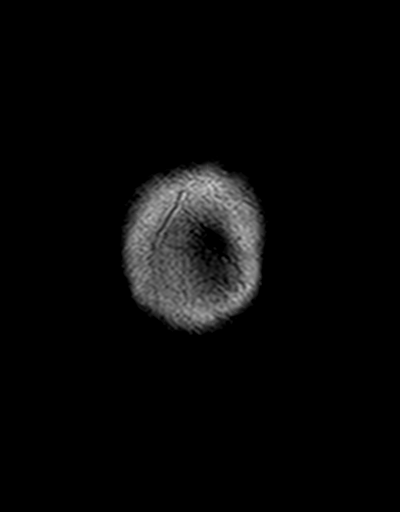
[im 27/27]
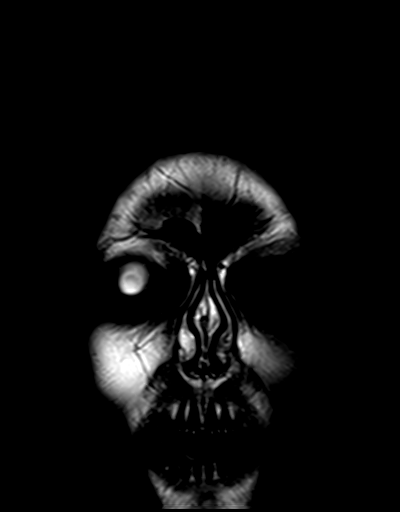

[Series 12: t1_mpr_tra · axial · 1.0mm · 0.72mm/px · z∈[-58,+100]mm · 8 of 160 slices shown (2 of 2)]
[im 1/160]
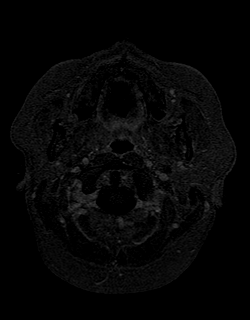
[im 32/160]
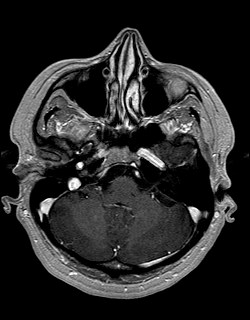
[im 48/160]
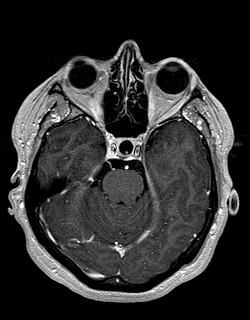
[im 64/160]
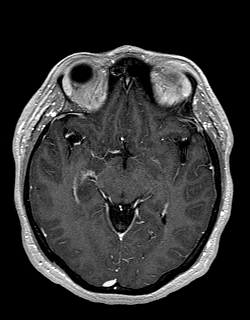
[im 96/160]
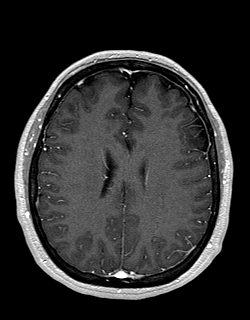
[im 112/160]
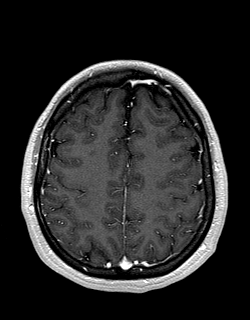
[im 128/160]
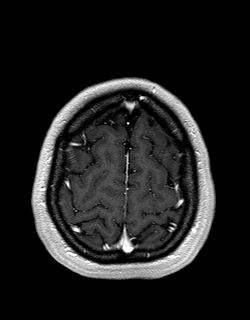
[im 160/160]
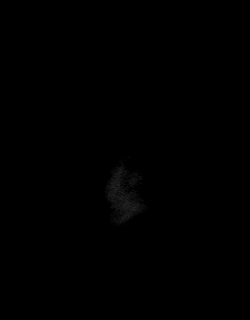

[Series 13: post cor · coronal · 5.0mm · 0.45mm/px · 2 of 27 slices shown]
[im 1/27]
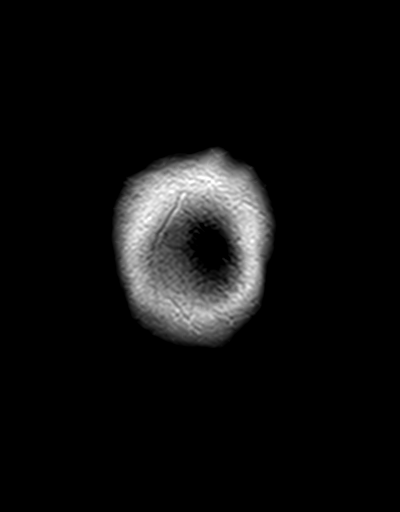
[im 27/27]
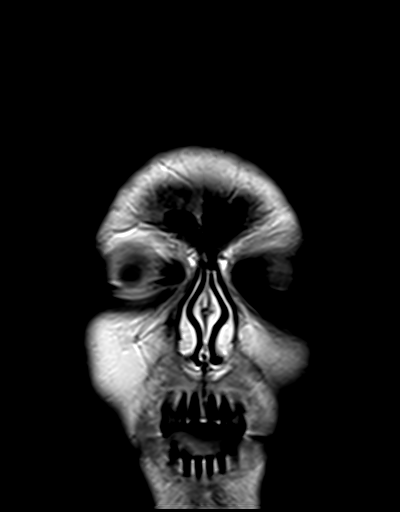

[Series 14: post sag · sagittal · 5.0mm · 0.45mm/px · 2 of 25 slices shown]
[im 1/25]
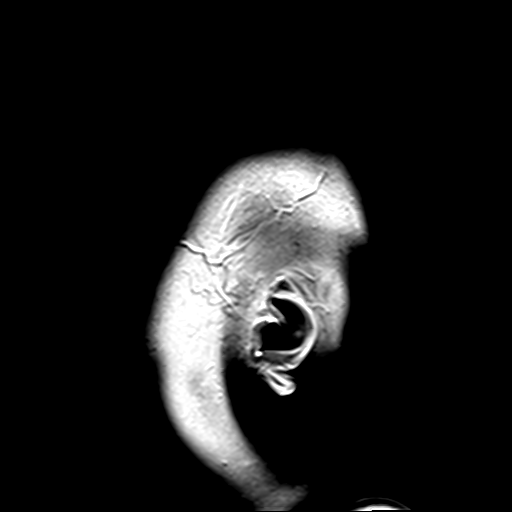
[im 25/25]
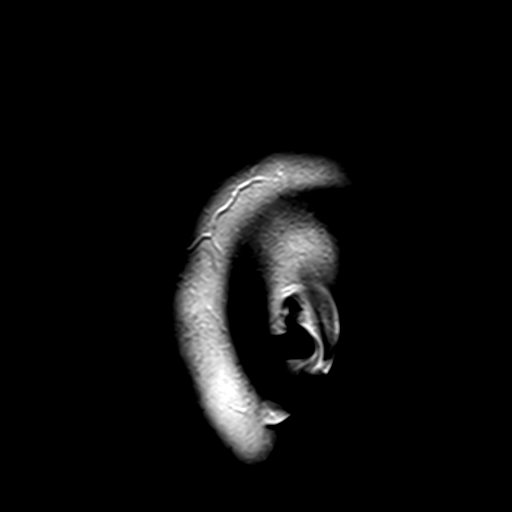

[45 of 48 positions shown; findings below may reference images not displayed]

FINDINGS: Brain: There are multiple pericallosal FLAIR hyperintensities. There
also juxta cortical signal abnormalities primarily in the bilateral
temporal lobes, most notably on the right where a prominent rounded
lesion shows discontinuous peripheral enhancement and measures 1 cm.
Nodular enhancement is seen within small left deep white matter
signal abnormalities, see coronal postcontrast imaging. Cord lesions
which are known from recent spinal MRI. There appears to be
bilateral the cerebellar signal abnormality on FLAIR imaging, but
this may be artifactual. There is likely infratentorial signal along
the upper left middle cerebellar peduncle. No notable deep gray,
peri with ductal gray, or brainstem involvement to implicate
neuromyelitis [place] in this patient with extensive cord disease

No infarct, significant swelling, hydrocephalus, or collection.

Vascular: Major flow voids and vascular enhancements are preserved

Skull and upper cervical spine: Negative for marrow lesion

Sinuses/Orbits: Negative

Other: They have
IMPRESSION: Multifocal white matter signal abnormality consistent with
demyelinating disease, usually multiple sclerosis. Plaque
enhancement is present, consistent with active disease

## 2019-07-04 ENCOUNTER — Other Ambulatory Visit: Payer: Self-pay

## 2019-07-04 ENCOUNTER — Encounter: Payer: Self-pay | Admitting: Neurology

## 2019-07-04 ENCOUNTER — Telehealth: Payer: Self-pay | Admitting: *Deleted

## 2019-07-04 ENCOUNTER — Ambulatory Visit (INDEPENDENT_AMBULATORY_CARE_PROVIDER_SITE_OTHER): Payer: Self-pay | Admitting: Neurology

## 2019-07-04 VITALS — BP 111/70 | HR 87 | Temp 97.6°F | Ht 65.0 in | Wt 229.5 lb

## 2019-07-04 DIAGNOSIS — G35 Multiple sclerosis: Secondary | ICD-10-CM | POA: Insufficient documentation

## 2019-07-04 DIAGNOSIS — G35A Relapsing-remitting multiple sclerosis: Secondary | ICD-10-CM | POA: Insufficient documentation

## 2019-07-04 DIAGNOSIS — R260 Ataxic gait: Secondary | ICD-10-CM | POA: Insufficient documentation

## 2019-07-04 DIAGNOSIS — R200 Anesthesia of skin: Secondary | ICD-10-CM | POA: Insufficient documentation

## 2019-07-04 DIAGNOSIS — Z79899 Other long term (current) drug therapy: Secondary | ICD-10-CM

## 2019-07-04 NOTE — Telephone Encounter (Signed)
JCV lab collected by in-office lab. Pending [facility] pickup.

## 2019-07-04 NOTE — Progress Notes (Signed)
[facility]  PATIENT: [name] Li DOB: 1981-07-30  REFERRING DOCTOR OR PCP:  Allyne Gee SOURCE: Patient, sister, interpreter, notes from Dr. Mordecai Maes, imaging reports, multiple MRI images personally reviewed.  _________________________________   HISTORICAL  CHIEF COMPLAINT:  Chief Complaint  Patient presents with  . Referral    MS consult room 12 with intepreter and sister Patrica her sister    HISTORY OF PRESENT ILLNESS:  I had the pleasure of seeing your patient, [name] Li, at the MS center at [facility] for neurologic consultation regarding her multiple sclerosis.  She is a 38 year old woman who was diagnosed with MS earlier this year after presenting with neurologic symptoms and abnormal imaging studies.   In January, she felt numbness in her right scalp and the entire left side with a heavy sensation in her left leg.   She also had mild issues with balance and gait.  Her right leg felt weak but the numbness and pain was more noted than weakness.  She had MRI studies initially of the lumbar spine which did not show significant degenerative changes but did show some abnormal signal in the thoracic spinal cord concerning for demyelination.  She then had an MRI of the cervical and thoracic spine 11/30/2018 showing multiple lesions including some that enhance, consistent with MS.  She had an MRI of the brain on 12/03/2018 showing multiple lesions consistent with MS including one that enhanced.  She received 5 days of IV Solumedrol in a clinic.  She saw a neurologist in [place] and then had the infusions in [place].  She was never started on a DMT.  Because she spends more time in [place] than [place] she would prefer to do her neurologic care locally and did not return to the neurologist in [place]..  She felt better after the steroids for the next few months.  However, since last week, she is noting more problems  with her lower back and walking is difficult.    She has more pain in the back and legs.    In retrospect she had numbness in her hands and legs at various times in 2019 and noted dysesthesias.     However, there were no imaging studies at that time.  Currently, she is noting some problems with her gait and balance.   The left leg is weaker and stiffer.   She has pain in her left leg and foot.    No change in bladder function.   She is feeling her vision is slightly worse but both eyes are the same and no color desaturation.  She is not noting much fatigue.  She notes no change in cognition.  She notes mild depression..    No FH of MS.      I personally reviewed multiple imaging studies.  My summaries are: MRI brain 12/03/2018:   Multiple T2/flair hyperintense foci predominantly in the periventricular white matter with some in the in the juxtacortical white matter.  A large focus in the right medial temporal lobe enhances after contrast.  MRI cervical spine 12/01/2018: Extensive T2 hyperintense signal within the spinal cord.  Posteriorly to the left at C2, medially at C2-C3, posteriorly to the right at C3 and towards the right at C3 (this focus enhances), medially and posteriorly at C4-C5, left greater than right, anteriorly at C5-C6 (mild enhancement), medially at C6-C7.  MRI thoracic spine 12/01/2018: This study is degraded by motion.  Extensive foci throughout this thoracic spine with probable enhancement  at T2-T3, T6-T7 and T8-T9 (difficult to be certain due to motion and seen enhancement only on axial not sagittal views)  MRI lumbar spine 10/27/2018: Degenerative changes towards the right at L4-L5 but no nerve root compression.  Abnormal signal within the visible thoracic spinal cord concerning for demyelination.   REVIEW OF SYSTEMS: Constitutional: No fevers, chills, sweats, or change in appetite Eyes: No visual changes, double vision, eye pain Ear, nose and throat: No hearing loss, ear pain,  nasal congestion, sore throat Cardiovascular: No chest pain, palpitations Respiratory: No shortness of breath at rest or with exertion.   No wheezes GastrointestinaI: No nausea, vomiting, diarrhea, abdominal pain, fecal incontinence Genitourinary: No dysuria, urinary retention or frequency.  No nocturia. Musculoskeletal: No neck pain, back pain Integumentary: No rash, pruritus, skin lesions Neurological: as above Psychiatric: No depression at this time.  No anxiety Endocrine: No palpitations, diaphoresis, change in appetite, change in weigh or increased thirst Hematologic/Lymphatic: No anemia, purpura, petechiae. Allergic/Immunologic: No itchy/runny eyes, nasal congestion, recent allergic reactions, rashes  ALLERGIES: No Known Allergies  HOME MEDICATIONS:  Current Outpatient Medications:  .  DULoxetine (CYMBALTA) 60 MG capsule, TAKE 1 CAPSULE BY MOUTH ONCE DAILY FOR 90 DAYS, Disp: , Rfl:  .  vitamin B-12 (CYANOCOBALAMIN) 100 MCG tablet, Vitamin B12  i tab daily, Disp: , Rfl:   PAST MEDICAL HISTORY: Past Medical History:  Diagnosis Date  . Depression   . Gestational diabetes    glyburide  . Multiple sclerosis (HCC)     PAST SURGICAL HISTORY: Past Surgical History:  Procedure Laterality Date  . NO PAST SURGERIES      FAMILY HISTORY: Family History  Problem Relation Age of Onset  . Mental illness Mother        depression and OCD - sees psychiatrist  . Multiple births Mother        pregnant with twins but lost them  . Hypertension Father   . Diabetes Father   . Heart disease Father   . Diabetes Brother   . Thyroid disease Brother     SOCIAL HISTORY:  Social History   Socioeconomic History  . Marital status: Married    Spouse name: Not on file  . Number of children: Not on file  . Years of education: Not on file  . Highest education level: Not on file  Occupational History  . Not on file  Social Needs  . Financial resource strain: Not on file  . Food  insecurity    Worry: Not on file    Inability: Not on file  . Transportation needs    Medical: Not on file    Non-medical: Not on file  Tobacco Use  . Smoking status: Never Smoker  . Smokeless tobacco: Never Used  Substance and Sexual Activity  . Alcohol use: No  . Drug use: No  . Sexual activity: Yes    Birth control/protection: None  Lifestyle  . Physical activity    Days per week: Not on file    Minutes per session: Not on file  . Stress: Not on file  Relationships  . Social Musician on phone: Not on file    Gets together: Not on file    Attends religious service: Not on file    Active member of club or organization: Not on file    Attends meetings of clubs or organizations: Not on file    Relationship status: Not on file  . Intimate partner violence  Fear of current or ex partner: Not on file    Emotionally abused: Not on file    Physically abused: Not on file    Forced sexual activity: Not on file  Other Topics Concern  . Not on file  Social History Narrative  . Not on file     PHYSICAL EXAM  Vitals:   07/04/19 0909  BP: 111/70  Pulse: 87  Temp: 97.6 F (36.4 C)  Weight: 229 lb 8 oz (104.1 kg)  Height: 5\' 5"  (1.651 m)    Body mass index is 38.19 kg/m.   General: The patient is well-developed and well-nourished and in no acute distress  HEENT:  Head is [place]/AT.  Sclera are anicteric.  Funduscopic exam shows normal optic discs and retinal vessels.  Neck: No carotid bruits are noted.  The neck is nontender.  Cardiovascular: The heart has a regular rate and rhythm with a normal S1 and S2. There were no murmurs, gallops or rubs.    Skin: Extremities are without rash or  edema.  Musculoskeletal:  Back is nontender  Neurologic Exam  Mental status: The patient is alert and oriented x 3 at the time of the examination. The patient has apparent normal recent and remote memory, with an apparently normal attention span and concentration  ability.   Speech is normal.  Cranial nerves: Extraocular movements are full. Pupils are equal, round, and reactive to light and accomodation.  Visual fields are full.  Facial symmetry is present. There is good facial sensation to soft touch bilaterally.Facial strength is normal.  Trapezius and sternocleidomastoid strength is normal. No dysarthria is noted.  The tongue is midline, and the patient has symmetric elevation of the soft palate. No obvious hearing deficits are noted.  Motor:  Muscle bulk is normal.   Tone is normal. Strength is  5 / 5 in all 4 extremities.   Sensory: She has less sensation to vibration in the left leg vs the right leg but symmetric sensation in her arms.  Touch was symmetric in the arms and legs  Coordination: Cerebellar testing reveals good finger-nose-finger and heel-to-shin bilaterally.  Gait and station: Station is normal.   The gait is pretty normal but the tandem gait is wide.  Romberg is negative.   Reflexes: Deep tendon reflexes are symmetric and normal bilaterally.  Marland Kitchen.    DIAGNOSTIC DATA (LABS, IMAGING, TESTING) - I reviewed patient records, labs, notes, testing and imaging myself where available.  Lab Results  Component Value Date   WBC 8.1 01/03/2018   HGB 14.5 01/03/2018   HCT 43.4 01/03/2018   MCV 88.2 01/03/2018   PLT 238 01/03/2018      Component Value Date/Time   NA 139 01/03/2018 1830   K 4.1 01/03/2018 1830   CL 106 01/03/2018 1830   CO2 24 01/03/2018 1830   GLUCOSE 97 01/03/2018 1830   BUN 10 01/03/2018 1830   CREATININE 0.64 01/03/2018 1830   CREATININE 0.49 (L) 02/29/2016 0001   CALCIUM 9.4 01/03/2018 1830   PROT 8.1 01/03/2018 1830   ALBUMIN 4.4 01/03/2018 1830   AST 20 01/03/2018 1830   ALT 26 01/03/2018 1830   ALKPHOS 52 01/03/2018 1830   BILITOT 1.2 01/03/2018 1830   GFRNONAA >60 01/03/2018 1830   GFRAA >60 01/03/2018 1830   No results found for: CHOL, HDL, LDLCALC, LDLDIRECT, TRIG, CHOLHDL Lab Results  Component  Value Date   HGBA1C 5.7 (H) 02/29/2016   No results found for: [id number] Lab Results  Component Value Date   TSH 2.07 02/15/2016       ASSESSMENT AND PLAN  Multiple sclerosis ([place]) - Plan: CBC with Differential/Platelet, Stratify JCV Antibody Test ([facility])  High risk medication use - Plan: CBC with Differential/Platelet, Stratify JCV Antibody Test ([facility])  Numbness  Ataxic gait   In summary, Ms. Danella Penton is a 38 year old woman who has had intermittent neurologic symptoms over the past couple of years with 2 episodes this year.  She was diagnosed with MS based on imaging studies in March this year.  Despite the great number of lesions in the spinal cord, she has fairly mild impairments.  Due to the many spinal cord lesions, she appears to have an aggressive MS.  Because of that, I recommend that she be placed on either Tysabri or Ocrevus and through her sister and interpreter we discussed the risks and benefits.  She is concerned about the possible higher risk of breast cancer with Ocrevus.  Therefore, we will check a JCV antibody today.  If she is JCV antibody negative then I will recommend that she go on Tysabri.  If she is JCV antibody positive, Ocrevus would likely be the safer option for her.  If she does decide to do Ocrevus, we are participating in a minorities open label study were all participates we will get Ocrevus.  As part of the study, she will also be able to get MRIs and lab work for the next couple of years if done at the timing of the protocol (she might not be a candidate for the study if the language becomes an issue).  She would like to give this more thought.  The JCV antibody can take about a week to get back so hopefully we will be able to contact her within the next week and decide on the treatment.   Auren Valdes A. Felecia Shelling, MD, [facility] 11/02/3783, 8:85 PM Certified in Neurology, Clinical Neurophysiology, Sleep Medicine and Neuroimaging  [facility] [address] [place], [place] [address] [phone]

## 2019-07-05 LAB — CBC WITH DIFFERENTIAL/PLATELET
Basophils Absolute: 0 10*3/uL (ref 0.0–0.2)
Basos: 0 %
EOS (ABSOLUTE): 0.1 10*3/uL (ref 0.0–0.4)
Eos: 1 %
Hematocrit: 39.9 % (ref 34.0–46.6)
Hemoglobin: 13.5 g/dL (ref 11.1–15.9)
Immature Grans (Abs): 0 10*3/uL (ref 0.0–0.1)
Immature Granulocytes: 0 %
Lymphocytes Absolute: 2.7 10*3/uL (ref 0.7–3.1)
Lymphs: 36 %
MCH: 29.6 pg (ref 26.6–33.0)
MCHC: 33.8 g/dL (ref 31.5–35.7)
MCV: 88 fL (ref 79–97)
Monocytes Absolute: 0.4 10*3/uL (ref 0.1–0.9)
Monocytes: 5 %
Neutrophils Absolute: 4.2 10*3/uL (ref 1.4–7.0)
Neutrophils: 58 %
Platelets: 267 10*3/uL (ref 150–450)
RBC: 4.56 x10E6/uL (ref 3.77–5.28)
RDW: 12.1 % (ref 11.7–15.4)
WBC: 7.4 10*3/uL (ref 3.4–10.8)

## 2019-07-11 NOTE — Telephone Encounter (Signed)
Called [facility] at [phone] and spoke with [facility]. JCV ab lab test only has partial results. Inhibition assay pending. She will fax me partial results to [phone].

## 2019-07-11 NOTE — Telephone Encounter (Signed)
Received fax with partial results from [facility]: JCV ab indeterminate, index: 0.25. Inhibition assay pending.

## 2019-07-15 ENCOUNTER — Telehealth: Payer: Self-pay | Admitting: Neurology

## 2019-07-15 NOTE — Telephone Encounter (Signed)
Inhibition assay came back: negative  

## 2019-07-15 NOTE — Telephone Encounter (Signed)
error 

## 2019-07-16 ENCOUNTER — Telehealth: Payer: Self-pay | Admitting: *Deleted

## 2019-07-16 NOTE — Telephone Encounter (Signed)
Called and spoke with Support Coordinator at [phone] with [place]. Explained pt has no insurance and wondering if she would be eligible for free drug. They can contact patient to screen her for free drug. Pt would be responsible for administration fee at infusion site she goes to. I will speak with MD to see how he wants to proceed. Administration fee for our office would be 500-600 per infusion.

## 2019-07-16 NOTE — Telephone Encounter (Signed)
I called [facility] with [facility] and spoke with [facility] who transferred me to infusion suite. They did not know how much administration fee would be for pt for each infusion. They will have the manager call me back for this.

## 2019-07-17 NOTE — Telephone Encounter (Signed)
Received call back. Pt/sister will come by office tomorrow to sign Tysabri start form. Sister states pt may have [place]. They will call today to see if she has active coverage or not. If not, I will give them [place] financial assistance info for pt to apply. They will update about possible insurance coverage tomorrow when they come to sign form

## 2019-07-17 NOTE — Telephone Encounter (Addendum)
[place]. If pt applies and is approved 100%, they should cover administration fee for Tysabri each month.  I called [place] interpreter. Spanish interpreter [phone]. He called [phone] and LVM for pt to call office.  He also tried [phone]. LVM on this as well.  Pt needs to come in and sign start form for Tysabri and pick up application for pt assistance. She needs to complete this and mail in. Once they receive, it takes 1-2 weeks to process. Pt will be mailed letter if she is approved.

## 2019-07-18 NOTE — Telephone Encounter (Signed)
Pt and sister came and signed Tysabri start form. They were provided application/policy for [facility] Financial assistance. She will have [facility] insurance effective 07/23/2019. They know to bring a copy of insurance cards once she obtains asap so we can submit start form. Her new ID: [id number].

## 2019-07-31 NOTE — Telephone Encounter (Signed)
Checked pt chart, no insurance scanned in and pt has not dropped off new insurance cards yet.

## 2019-07-31 NOTE — Telephone Encounter (Signed)
The [employer] interpreters and spoke with [place]. ID# [id number]. She called and spoke with sister, [name] Li to see if they have copy of new insurance cards. States Debbe has not dropped off a copy of her insurance cards yet. [name] Li states she will call [place] and have her drop off copy of updated insurance cards. Advised we close at 5pm today and to let girls up front know copy of cards is for [facility]. She verbalized understanding.

## 2019-07-31 NOTE — Telephone Encounter (Signed)
Pt sister(on DPR) is asking for a call re:the tysabri, pt. Sister states the insurance is good until January.  Please call to discuss pt moving forward

## 2019-08-01 ENCOUNTER — Telehealth: Payer: Self-pay

## 2019-08-01 NOTE — Telephone Encounter (Signed)
Gave completed/signed start form to [facility] ([name]) to start processing for pt.

## 2019-08-01 NOTE — Telephone Encounter (Signed)
[employer] interpreters and spoke with [place]. He called sister back for me. Sister states she just spoke with Jamariya earlier who was told her insurance is cx d/t health illness. They are going to try and see if she can get insurance coverage another way. They will call me back to update me on this.   They have not submitted [place] financial assistance application that I gave them. I expressed importance of doing this asap. She verbalized understanding.

## 2019-08-01 NOTE — Telephone Encounter (Signed)
[name] Li called and stated that her insurance has been cancelled and would like to know what they should do next. Please advise

## 2019-08-01 NOTE — Telephone Encounter (Addendum)
[place] interpreters at [phone]. Spoke with Antony Haste, [place] interpreter. He called and LVM for Mardene Celeste (sister) asking for a call back because we did not receive copy of insurance cards from pt yesterday.  He called pt at [phone]. I was able to speak with pt. States she has not received the cards yet. Has letter with numbers on it from insurance:  [facility] ID #: [id number] Phone: [phone] Coverage started on 07/19/19-07/28/19. 07/28/19 they collected another payment and now her insurance is active until 09/27/2019.   Advised pt to bring copy of insurance cards once she receives them. I will see if we can process start form with info that we have in the meantime. Pt verbalized understanding.

## 2019-08-01 NOTE — Telephone Encounter (Signed)
Faxed completed/signed Tysabri start form to [facility] at [phone]. Received fax confirmation.

## 2019-08-05 NOTE — Telephone Encounter (Signed)
Took call from phone staff. Spoke with sister, Mardene Celeste. She called financial assistance number at [phone]. Unable to reach physical person to discuss application. They went to drop off application but told address changed. Asked her to try and call number back again and call me back if she still had issues getting through.

## 2019-08-05 NOTE — Telephone Encounter (Signed)
Waiting on update from infusion suite if pt can be infused at our office. MD aware

## 2019-08-06 NOTE — Telephone Encounter (Signed)
Took call from phone staff. Spoke with sister, Mardene Celeste. She spoke with financial assistance who stated that pt cannot apply for financial assistance because she does not have an outstanding balance/account yet. If she applies, she cannot reapply for another 6 months. Pt is trying to purchase insurance, trying to increase her prices d/t medical history per sister. Advised I will discuss with Dr. Felecia Shelling and call back.

## 2019-08-06 NOTE — Telephone Encounter (Signed)
Dr. Felecia Shelling- how would you like to proceed? They are saying she does not qualify for financial assistance and infusion suite getting reject via insurance stating it does not cover preexisting insurance. Sister says pt does not have insurance.

## 2019-08-06 NOTE — Telephone Encounter (Signed)
Sent message to infusion suite to see if they have any updates on their end about infusion.

## 2019-08-07 NOTE — Telephone Encounter (Signed)
I would prefer her to be on one of the IV medications but it is most important that she be treated with one of the medicines.    We can see if we can get her on Vumerity short-term until either she gets insurance or the other issues are resolved.

## 2019-08-08 NOTE — Telephone Encounter (Signed)
[name] Li-  I talked with Dr. Felecia Shelling. Can you call on Monday first thing to see if she can come in for an appt? She has no insurance, billing may have to work out payment plan with her. He would like to put on on a pill until she can get insurance to go on IV therapy for MS

## 2019-08-08 NOTE — Telephone Encounter (Signed)
Called, LVM for sister Mardene Celeste to call office.  Dr. Felecia Shelling would like to schedule f/u for pt to come in and discuss treatment options.

## 2019-08-12 NOTE — Telephone Encounter (Signed)
I reached out to the pt via [place] interpreter Nicole Kindred)  I advised we would like to schedule a f/u.  Pt was agreeable and I schedule for 08/14/2019 at 230 am check in time of 2.  Pt reported understanding of appt time and check in time.

## 2019-08-14 ENCOUNTER — Other Ambulatory Visit: Payer: Self-pay

## 2019-08-14 ENCOUNTER — Encounter: Payer: Self-pay | Admitting: Neurology

## 2019-08-14 ENCOUNTER — Ambulatory Visit: Payer: Self-pay | Admitting: Neurology

## 2019-08-14 VITALS — BP 111/72 | HR 87 | Temp 97.6°F | Wt 236.0 lb

## 2019-08-14 DIAGNOSIS — R200 Anesthesia of skin: Secondary | ICD-10-CM

## 2019-08-14 DIAGNOSIS — G35 Multiple sclerosis: Secondary | ICD-10-CM

## 2019-08-14 DIAGNOSIS — R260 Ataxic gait: Secondary | ICD-10-CM

## 2019-08-14 NOTE — Progress Notes (Signed)
[facility]  PATIENT: Reneisha Stilley DOB: 09-19-1981  REFERRING DOCTOR OR PCP:  Allyne Gee SOURCE: Patient, sister, interpreter, notes from Dr. Mordecai Maes, imaging reports, multiple MRI images personally reviewed.  _________________________________   HISTORICAL  CHIEF COMPLAINT:  Chief Complaint  Patient presents with  . Follow-up    RM 13 here for f/u to discus MS meds     HISTORY OF PRESENT ILLNESS:  Lyndy Russman is a 38 yo woman with multiple sclerosis.  Update 08/14/2019 I had a conversation with [name] and a Spanish interpreter.  After her initial visit a month ago, and based on the aggressiveness of her MS, we had decided on either Tysabri or Ocrevus based on the results of the JCV antibody test.  That test has returned negative.  We tried to get this started but there were insurance issues as the policy she had earlier this year has lapsed.  She is trying to get into [facility] patient assistance program but there has been some difficulty.  The interpreter spoke to her further about this and she is going to try to reapply.  We did send in the service request form for Tysabri.  [name] called them and they will reach out to Ms. Saddie Benders through their Spanish line to try to help her get free drug.  Once she gets enrolled into the [facility] health patient assistance, hopefully we can get her scheduled to get started.  We did discuss that if we have trouble getting her on the Tysabri that I would want to start one of the oral agents.  My preference would be a higher strength medication but it is most important that she be started on one of the medicines.  Initial consultation 07/04/2019: She is a 38 year old woman who was diagnosed with MS earlier this year after presenting with neurologic symptoms and abnormal imaging studies.   In January, she felt numbness in her right scalp and the entire left side with a heavy sensation in  her left leg.   She also had mild issues with balance and gait.  Her right leg felt weak but the numbness and pain was more noted than weakness.  She had MRI studies initially of the lumbar spine which did not show significant degenerative changes but did show some abnormal signal in the thoracic spinal cord concerning for demyelination.  She then had an MRI of the cervical and thoracic spine 11/30/2018 showing multiple lesions including some that enhance, consistent with MS.  She had an MRI of the brain on 12/03/2018 showing multiple lesions consistent with MS including one that enhanced.  She received 5 days of IV Solumedrol in a clinic.  She saw a neurologist in [place] and then had the infusions in [place].  She was never started on a DMT.  Because she spends more time in [place] than [place] she would prefer to do her neurologic care locally and did not return to the neurologist in [place]..  She felt better after the steroids for the next few months.  However, since last week, she is noting more problems with her lower back and walking is difficult.    She has more pain in the back and legs.    In retrospect she had numbness in her hands and legs at various times in 2019 and noted dysesthesias.     However, there were no imaging studies at that time.  Currently, she is noting some problems with her gait and balance.  The left leg is weaker and stiffer.   She has pain in her left leg and foot.    No change in bladder function.   She is feeling her vision is slightly worse but both eyes are the same and no color desaturation.  She is not noting much fatigue.  She notes no change in cognition.  She notes mild depression..    No FH of MS.      I personally reviewed multiple imaging studies.  My summaries are: MRI brain 12/03/2018:   Multiple T2/flair hyperintense foci predominantly in the periventricular white matter with some in the in the juxtacortical white matter.  A large focus in the right  medial temporal lobe enhances after contrast.  MRI cervical spine 12/01/2018: Extensive T2 hyperintense signal within the spinal cord.  Posteriorly to the left at C2, medially at C2-C3, posteriorly to the right at C3 and towards the right at C3 (this focus enhances), medially and posteriorly at C4-C5, left greater than right, anteriorly at C5-C6 (mild enhancement), medially at C6-C7.  MRI thoracic spine 12/01/2018: This study is degraded by motion.  Extensive foci throughout this thoracic spine with probable enhancement at T2-T3, T6-T7 and T8-T9 (difficult to be certain due to motion and seen enhancement only on axial not sagittal views)  MRI lumbar spine 10/27/2018: Degenerative changes towards the right at L4-L5 but no nerve root compression.  Abnormal signal within the visible thoracic spinal cord concerning for demyelination.   REVIEW OF SYSTEMS: Constitutional: No fevers, chills, sweats, or change in appetite Eyes: No visual changes, double vision, eye pain Ear, nose and throat: No hearing loss, ear pain, nasal congestion, sore throat Cardiovascular: No chest pain, palpitations Respiratory: No shortness of breath at rest or with exertion.   No wheezes GastrointestinaI: No nausea, vomiting, diarrhea, abdominal pain, fecal incontinence Genitourinary: No dysuria, urinary retention or frequency.  No nocturia. Musculoskeletal: No neck pain, back pain Integumentary: No rash, pruritus, skin lesions Neurological: as above Psychiatric: No depression at this time.  No anxiety Endocrine: No palpitations, diaphoresis, change in appetite, change in weigh or increased thirst Hematologic/Lymphatic: No anemia, purpura, petechiae. Allergic/Immunologic: No itchy/runny eyes, nasal congestion, recent allergic reactions, rashes  ALLERGIES: No Known Allergies  HOME MEDICATIONS:  Current Outpatient Medications:  .  DULoxetine (CYMBALTA) 60 MG capsule, Take 60 mg by mouth daily., Disp: , Rfl:  .   oxyCODONE-Acetaminophen (MAGNACET PO), Take by mouth., Disp: , Rfl:   PAST MEDICAL HISTORY: Past Medical History:  Diagnosis Date  . Depression   . Gestational diabetes    glyburide  . Multiple sclerosis (HCC)     PAST SURGICAL HISTORY: Past Surgical History:  Procedure Laterality Date  . NO PAST SURGERIES      FAMILY HISTORY: Family History  Problem Relation Age of Onset  . Mental illness Mother        depression and OCD - sees psychiatrist  . Multiple births Mother        pregnant with twins but lost them  . Hypertension Father   . Diabetes Father   . Heart disease Father   . Diabetes Brother   . Thyroid disease Brother     SOCIAL HISTORY:  Social History   Socioeconomic History  . Marital status: Married    Spouse name: Not on file  . Number of children: Not on file  . Years of education: Not on file  . Highest education level: Not on file  Occupational History  . Not on file  Social Needs  . Financial resource strain: Not on file  . Food insecurity    Worry: Not on file    Inability: Not on file  . Transportation needs    Medical: Not on file    Non-medical: Not on file  Tobacco Use  . Smoking status: Never Smoker  . Smokeless tobacco: Never Used  Substance and Sexual Activity  . Alcohol use: No  . Drug use: No  . Sexual activity: Yes    Birth control/protection: None  Lifestyle  . Physical activity    Days per week: Not on file    Minutes per session: Not on file  . Stress: Not on file  Relationships  . Social Musician on phone: Not on file    Gets together: Not on file    Attends religious service: Not on file    Active member of club or organization: Not on file    Attends meetings of clubs or organizations: Not on file    Relationship status: Not on file  . Intimate partner violence    Fear of current or ex partner: Not on file    Emotionally abused: Not on file    Physically abused: Not on file    Forced sexual activity:  Not on file  Other Topics Concern  . Not on file  Social History Narrative  . Not on file     PHYSICAL EXAM  Vitals:   08/14/19 1433  BP: 111/72  Pulse: 87  Temp: 97.6 F (36.4 C)  TempSrc: Temporal  Weight: 236 lb (107 kg)    Body mass index is 39.27 kg/m.   General: The patient is well-developed and well-nourished and in no acute distress  HEENT:  Head is /AT.    Skin: Extremities are without rash or  edema.  Neurologic Exam  Mental status: The patient is alert and oriented x 3 at the time of the examination. The patient has apparent normal recent and remote memory, with an apparently normal attention span and concentration ability.   Speech is normal.  Cranial nerves: Extraocular movements are full.  There is good facial sensation to soft touch bilaterally.Facial strength is normal.  Trapezius and sternocleidomastoid strength is normal. No dysarthria is noted.    No obvious hearing deficits are noted.  Motor:  Muscle bulk is normal.   Tone is normal. Strength is  5 / 5 in all 4 extremities.   Sensory: She has reduced vibration sensation in the left leg  Coordination: Cerebellar testing reveals good finger-nose-finger and heel-to-shin bilaterally.  Gait and station: Station is normal.   The gait is pretty normal but the tandem gait is wide.  Romberg is negative.   Reflexes: Deep tendon reflexes are symmetric and normal bilaterally.  Marland Kitchen    DIAGNOSTIC DATA (LABS, IMAGING, TESTING) - I reviewed patient records, labs, notes, testing and imaging myself where available.  Lab Results  Component Value Date   WBC 7.4 07/04/2019   HGB 13.5 07/04/2019   HCT 39.9 07/04/2019   MCV 88 07/04/2019   PLT 267 07/04/2019      Component Value Date/Time   NA 139 01/03/2018 1830   K 4.1 01/03/2018 1830   CL 106 01/03/2018 1830   CO2 24 01/03/2018 1830   GLUCOSE 97 01/03/2018 1830   BUN 10 01/03/2018 1830   CREATININE 0.64 01/03/2018 1830   CREATININE 0.49 (L)  02/29/2016 0001   CALCIUM 9.4 01/03/2018 1830   PROT 8.1  01/03/2018 1830   ALBUMIN 4.4 01/03/2018 1830   AST 20 01/03/2018 1830   ALT 26 01/03/2018 1830   ALKPHOS 52 01/03/2018 1830   BILITOT 1.2 01/03/2018 1830   GFRNONAA >60 01/03/2018 1830   GFRAA >60 01/03/2018 1830   No results found for: CHOL, HDL, LDLCALC, LDLDIRECT, TRIG, CHOLHDL Lab Results  Component Value Date   HGBA1C 5.7 (H) 02/29/2016   No results found for: [id number] Lab Results  Component Value Date   TSH 2.07 02/15/2016       ASSESSMENT AND PLAN  Multiple sclerosis (HCC)  Numbness  Ataxic gait   1.   She will recontact [facility] patient assistance and provide the necessary forms so that she can be enrolled into the program.  She also needs to contact [employer] to make sure that they are aware of her situation so she can get free drug.  We have already sent in the service request form.  Once both of these have occurred we can get her scheduled for the infusion.   2.    She will return to see me in several months or sooner if there are new or worsening neurologic symptoms.    20 minutes face-to-face counseling and coordinating care about initiating an MS medication Richard A. Felecia Shelling, MD, [facility] 00/17/4944, 9:67 PM Certified in Neurology, Clinical Neurophysiology, Sleep Medicine and Neuroimaging  [facility] [address] [place], [place] [address] [phone]

## 2019-08-21 NOTE — Telephone Encounter (Signed)
I reached out to [place] touch program and spoke with [facility] and she was able to confirm the welcome call had been made to the pt and she is enrolled in the free drug program.

## 2019-09-24 ENCOUNTER — Ambulatory Visit: Payer: Self-pay | Admitting: Neurology

## 2019-09-24 ENCOUNTER — Other Ambulatory Visit: Payer: Self-pay

## 2019-10-09 ENCOUNTER — Telehealth: Payer: Self-pay | Admitting: Neurology

## 2019-10-09 NOTE — Telephone Encounter (Signed)
I called patient attempting to r/s 1/21 appointment. Patient does not have VM set up. Will try again later.

## 2019-10-15 ENCOUNTER — Encounter: Payer: Self-pay | Admitting: Neurology

## 2019-10-15 NOTE — Telephone Encounter (Signed)
I attempted to contact patient again to reschedule 1/21 appointment. Unable to reach patient or LVM. Appointment has been cancelled and letter has been sent to patient to advise.

## 2019-10-17 ENCOUNTER — Ambulatory Visit: Payer: Self-pay | Admitting: Neurology

## 2019-10-22 ENCOUNTER — Other Ambulatory Visit: Payer: Self-pay

## 2019-10-22 ENCOUNTER — Ambulatory Visit: Payer: Self-pay | Admitting: Neurology

## 2019-11-19 ENCOUNTER — Telehealth: Payer: Self-pay | Admitting: Neurology

## 2019-11-19 ENCOUNTER — Ambulatory Visit (INDEPENDENT_AMBULATORY_CARE_PROVIDER_SITE_OTHER): Payer: Self-pay | Admitting: Neurology

## 2019-11-19 DIAGNOSIS — Z0289 Encounter for other administrative examinations: Secondary | ICD-10-CM

## 2019-11-19 NOTE — Telephone Encounter (Signed)
Dr. Epimenio Foot- ok for her to keep appt in April?

## 2019-11-19 NOTE — Telephone Encounter (Signed)
Patient came in today for infusion. Patient was scheduled for follow-up on 11/20/19. After infusion, patient and interpreter stopped at check-out and asked for a different appointment because patient can't make the one tomorrow. I offered next avail (4/28) and patient accepted. FYI

## 2019-11-19 NOTE — Telephone Encounter (Signed)
4/28 is fine

## 2019-11-20 ENCOUNTER — Ambulatory Visit: Payer: Self-pay | Admitting: Neurology

## 2019-11-20 ENCOUNTER — Other Ambulatory Visit: Payer: Self-pay

## 2019-12-17 ENCOUNTER — Ambulatory Visit (INDEPENDENT_AMBULATORY_CARE_PROVIDER_SITE_OTHER): Payer: Self-pay | Admitting: Neurology

## 2019-12-17 ENCOUNTER — Other Ambulatory Visit: Payer: Self-pay

## 2019-12-17 DIAGNOSIS — Z0289 Encounter for other administrative examinations: Secondary | ICD-10-CM

## 2020-01-14 ENCOUNTER — Other Ambulatory Visit: Payer: Self-pay | Admitting: *Deleted

## 2020-01-14 DIAGNOSIS — G35 Multiple sclerosis: Secondary | ICD-10-CM

## 2020-01-14 DIAGNOSIS — Z79899 Other long term (current) drug therapy: Secondary | ICD-10-CM

## 2020-01-14 NOTE — Progress Notes (Signed)
Pt here for Tysabri infusion today with intrafusion. Her last JCV ab was checked 07/04/19. She needs labs and can complete today. Otherwise, she also has a f/u with Dr. Epimenio Foot next week 01/22/20 at 8:30am. Orders placed.

## 2020-01-15 ENCOUNTER — Ambulatory Visit (INDEPENDENT_AMBULATORY_CARE_PROVIDER_SITE_OTHER): Payer: Self-pay | Admitting: Neurology

## 2020-01-15 DIAGNOSIS — Z0289 Encounter for other administrative examinations: Secondary | ICD-10-CM

## 2020-01-22 ENCOUNTER — Ambulatory Visit (INDEPENDENT_AMBULATORY_CARE_PROVIDER_SITE_OTHER): Payer: Self-pay | Admitting: Neurology

## 2020-01-22 ENCOUNTER — Encounter: Payer: Self-pay | Admitting: Neurology

## 2020-01-22 ENCOUNTER — Telehealth: Payer: Self-pay | Admitting: *Deleted

## 2020-01-22 ENCOUNTER — Other Ambulatory Visit: Payer: Self-pay

## 2020-01-22 ENCOUNTER — Telehealth: Payer: Self-pay | Admitting: Neurology

## 2020-01-22 VITALS — BP 112/70 | HR 74 | Temp 97.4°F | Ht 65.0 in | Wt 237.0 lb

## 2020-01-22 DIAGNOSIS — Z79899 Other long term (current) drug therapy: Secondary | ICD-10-CM

## 2020-01-22 DIAGNOSIS — G35 Multiple sclerosis: Secondary | ICD-10-CM

## 2020-01-22 DIAGNOSIS — R260 Ataxic gait: Secondary | ICD-10-CM

## 2020-01-22 NOTE — Telephone Encounter (Signed)
Placed JCV lab in quest lock box for routine lab pick up. Results pending. 

## 2020-01-22 NOTE — Telephone Encounter (Signed)
[facility] assistance order sent to GI. They will reach out to the patient to schedule.  

## 2020-01-22 NOTE — Progress Notes (Signed)
[facility]  PATIENT: [name] Li DOB: November 19, 1980  REFERRING DOCTOR OR PCP:  Allyne Gee SOURCE: Patient, sister, interpreter, notes from Dr. Mordecai Maes, imaging reports, multiple MRI images personally reviewed.  _________________________________   HISTORICAL  CHIEF COMPLAINT:  Chief Complaint  Patient presents with  . Follow-up    RM 12, with spanish interpreter. Last seen 08/14/2019.   . Multiple Sclerosis    On Tysabri. Last infusion 01/14/2020. Last JCV 07/04/2019 indeterminate, 0.25. Inhibition assay: negative. SHE NEEDS LABS.     HISTORY OF PRESENT ILLNESS:  [name] Li is a 39 yo woman with relapsing remitting multiple sclerosis.  Update 01/22/20 She is on Tysabri and tolerates it well.   No exacerbations or new neurologic symptoms.  She is walking better but ha had only one fall, when she slipped on a wet floor.   She feels her legs are strong but she has numbness in the mornings when she wakes up.  Vision is doing well.    She denies urinary dysfunction.  She sleeps well some nights but her daughter keeps her up some nights.   She has some fatigue but less than last visit.    She is trying to do some more leisure activities and started a garden and yardwork.   She denies any problems with mood or cognitoin.   She notes some neck pain but wonders if due to her pillw.       Update 08/14/2019 I had a conversation with [name] and a Spanish interpreter.  After her initial visit a month ago, and based on the aggressiveness of her MS, we had decided on either Tysabri or Ocrevus based on the results of the JCV antibody test.  That test has returned negative.  We tried to get this started but there were insurance issues as the policy she had earlier this year has lapsed.  She is trying to get into [place] patient assistance program but there has been some difficulty.  The interpreter spoke to her further about this and  she is going to try to reapply.  We did send in the service request form for Tysabri.  [name] called them and they will reach out to Ms. Saddie Benders through their Spanish line to try to help her get free drug.  Once she gets enrolled into the [facility] health patient assistance, hopefully we can get her scheduled to get started.  We did discuss that if we have trouble getting her on the Tysabri that I would want to start one of the oral agents.  My preference would be a higher strength medication but it is most important that she be started on one of the medicines.  Initial consultation 07/04/2019: She is a 39 year old woman who was diagnosed with MS earlier this year after presenting with neurologic symptoms and abnormal imaging studies.   In January, she felt numbness in her right scalp and the entire left side with a heavy sensation in her left leg.   She also had mild issues with balance and gait.  Her right leg felt weak but the numbness and pain was more noted than weakness.  She had MRI studies initially of the lumbar spine which did not show significant degenerative changes but did show some abnormal signal in the thoracic spinal cord concerning for demyelination.  She then had an MRI of the cervical and thoracic spine 11/30/2018 showing multiple lesions including some that enhance, consistent with MS.  She had an MRI  of the brain on 12/03/2018 showing multiple lesions consistent with MS including one that enhanced.  She received 5 days of IV Solumedrol in a clinic.  She saw a neurologist in [place] and then had the infusions in [place].  She was never started on a DMT.  Because she spends more time in [place] than [place] she would prefer to do her neurologic care locally and did not return to the neurologist in [place]..  She felt better after the steroids for the next few months.  However, since last week, she is noting more problems with her lower back and walking is difficult.    She has more  pain in the back and legs.    In retrospect she had numbness in her hands and legs at various times in 2019 and noted dysesthesias.     However, there were no imaging studies at that time.  Currently, she is noting some problems with her gait and balance.   The left leg is weaker and stiffer.   She has pain in her left leg and foot.    No change in bladder function.   She is feeling her vision is slightly worse but both eyes are the same and no color desaturation.  She is not noting much fatigue.  She notes no change in cognition.  She notes mild depression..    No FH of MS.      I personally reviewed multiple imaging studies.  My summaries are: MRI brain 12/03/2018:   Multiple T2/flair hyperintense foci predominantly in the periventricular white matter with some in the in the juxtacortical white matter.  A large focus in the right medial temporal lobe enhances after contrast.  MRI cervical spine 12/01/2018: Extensive T2 hyperintense signal within the spinal cord.  Posteriorly to the left at C2, medially at C2-C3, posteriorly to the right at C3 and towards the right at C3 (this focus enhances), medially and posteriorly at C4-C5, left greater than right, anteriorly at C5-C6 (mild enhancement), medially at C6-C7.  MRI thoracic spine 12/01/2018: This study is degraded by motion.  Extensive foci throughout this thoracic spine with probable enhancement at T2-T3, T6-T7 and T8-T9 (difficult to be certain due to motion and seen enhancement only on axial not sagittal views)  MRI lumbar spine 10/27/2018: Degenerative changes towards the right at L4-L5 but no nerve root compression.  Abnormal signal within the visible thoracic spinal cord concerning for demyelination.   REVIEW OF SYSTEMS: Constitutional: No fevers, chills, sweats, or change in appetite Eyes: No visual changes, double vision, eye pain Ear, nose and throat: No hearing loss, ear pain, nasal congestion, sore throat Cardiovascular: No chest pain,  palpitations Respiratory: No shortness of breath at rest or with exertion.   No wheezes GastrointestinaI: No nausea, vomiting, diarrhea, abdominal pain, fecal incontinence Genitourinary: No dysuria, urinary retention or frequency.  No nocturia. Musculoskeletal: No neck pain, back pain Integumentary: No rash, pruritus, skin lesions Neurological: as above Psychiatric: No depression at this time.  No anxiety Endocrine: No palpitations, diaphoresis, change in appetite, change in weigh or increased thirst Hematologic/Lymphatic: No anemia, purpura, petechiae. Allergic/Immunologic: No itchy/runny eyes, nasal congestion, recent allergic reactions, rashes  ALLERGIES: No Known Allergies  HOME MEDICATIONS:  Current Outpatient Medications:  .  DULoxetine (CYMBALTA) 60 MG capsule, Take 60 mg by mouth daily., Disp: , Rfl:  .  oxyCODONE-Acetaminophen (MAGNACET PO), Take by mouth., Disp: , Rfl:   PAST MEDICAL HISTORY: Past Medical History:  Diagnosis Date  . Depression   .  Gestational diabetes    glyburide  . Multiple sclerosis ([place])     PAST SURGICAL HISTORY: Past Surgical History:  Procedure Laterality Date  . NO PAST SURGERIES      FAMILY HISTORY: Family History  Problem Relation Age of Onset  . Mental illness Mother        depression and OCD - sees psychiatrist  . Multiple births Mother        pregnant with twins but lost them  . Hypertension Father   . Diabetes Father   . Heart disease Father   . Diabetes Brother   . Thyroid disease Brother     SOCIAL HISTORY:  Social History   Socioeconomic History  . Marital status: Married    Spouse name: Not on file  . Number of children: Not on file  . Years of education: Not on file  . Highest education level: Not on file  Occupational History  . Not on file  Tobacco Use  . Smoking status: Never Smoker  . Smokeless tobacco: Never Used  Substance and Sexual Activity  . Alcohol use: No  . Drug use: No  . Sexual  activity: Yes    Birth control/protection: None  Other Topics Concern  . Not on file  Social History Narrative  . Not on file   Social Determinants of Health   Financial Resource Strain:   . Difficulty of Paying Living Expenses:   Food Insecurity:   . Worried About Charity fundraiser in the Last Year:   . Arboriculturist in the Last Year:   Transportation Needs:   . Film/video editor (Medical):   Marland Kitchen Lack of Transportation (Non-Medical):   Physical Activity:   . Days of Exercise per Week:   . Minutes of Exercise per Session:   Stress:   . Feeling of Stress :   Social Connections:   . Frequency of Communication with Friends and Family:   . Frequency of Social Gatherings with Friends and Family:   . Attends Religious Services:   . Active Member of Clubs or Organizations:   . Attends Archivist Meetings:   Marland Kitchen Marital Status:   Intimate Partner Violence:   . Fear of Current or Ex-Partner:   . Emotionally Abused:   Marland Kitchen Physically Abused:   . Sexually Abused:      PHYSICAL EXAM  Vitals:   01/22/20 0824  BP: 112/70  Pulse: 74  Temp: (!) 97.4 F (36.3 C)  SpO2: 98%  Weight: 237 lb (107.5 kg)  Height: 5\' 5"  (1.651 m)    Body mass index is 39.44 kg/m.   General: The patient is well-developed and well-nourished and in no acute distress  HEENT:  Head is [place]/AT.    Skin: Extremities are without rash or  edema.  Neurologic Exam  Mental status: The patient is alert and oriented x 3 at the time of the examination. The patient has apparent normal recent and remote memory, with an apparently normal attention span and concentration ability.   Speech is normal.  Cranial nerves: Extraocular movements are full.  There is good facial sensation to soft touch bilaterally.Facial strength is normal.  Trapezius and sternocleidomastoid strength is normal. No dysarthria is noted.    No obvious hearing deficits are noted.  Motor:  Muscle bulk is normal.   Tone is normal.  Strength is  5 / 5 in all 4 extremities.   Sensory: She has normal sensation to touch and vibration x  4 Coordination: Cerebellar testing reveals good finger-nose-finger and heel-to-shin bilaterally.  Gait and station: Station is normal.   Gait is fairly normal but tandem gait is wide  Romberg is negative.   Reflexes: Deep tendon reflexes are symmetric and normal bilaterally.  Marland Kitchen    DIAGNOSTIC DATA (LABS, IMAGING, TESTING) - I reviewed patient records, labs, notes, testing and imaging myself where available.  Lab Results  Component Value Date   WBC 7.4 07/04/2019   HGB 13.5 07/04/2019   HCT 39.9 07/04/2019   MCV 88 07/04/2019   PLT 267 07/04/2019      Component Value Date/Time   NA 139 01/03/2018 1830   K 4.1 01/03/2018 1830   CL 106 01/03/2018 1830   CO2 24 01/03/2018 1830   GLUCOSE 97 01/03/2018 1830   BUN 10 01/03/2018 1830   CREATININE 0.64 01/03/2018 1830   CREATININE 0.49 (L) 02/29/2016 0001   CALCIUM 9.4 01/03/2018 1830   PROT 8.1 01/03/2018 1830   ALBUMIN 4.4 01/03/2018 1830   AST 20 01/03/2018 1830   ALT 26 01/03/2018 1830   ALKPHOS 52 01/03/2018 1830   BILITOT 1.2 01/03/2018 1830   GFRNONAA >60 01/03/2018 1830   GFRAA >60 01/03/2018 1830   No results found for: CHOL, HDL, LDLCALC, LDLDIRECT, TRIG, CHOLHDL Lab Results  Component Value Date   HGBA1C 5.7 (H) 02/29/2016   No results found for: VITAMINB12 Lab Results  Component Value Date   TSH 2.07 02/15/2016       ASSESSMENT AND PLAN  Multiple sclerosis (HCC) - Plan: CBC with Differential/Platelet, Stratify JCV Antibody Test ([facility]), MR BRAIN W WO CONTRAST  High risk medication use - Plan: CBC with Differential/Platelet, Stratify JCV Antibody Test ([facility])  Ataxic gait   1.   Continue Tysabri.   Check CBC/diff and JCV Ab.  Check MRI brain to determine if any breakthrough disease.   If so, we need to consider a different DMT 2.    Stay active and exercise as tolerated. 3.   She will return to see  me in 6 months or sooner if there are new or worsening neurologic symptoms.     Tracy Kinner A. Epimenio Foot, MD, [facility] 01/22/2020, 8:56 AM Certified in Neurology, Clinical Neurophysiology, Sleep Medicine and Neuroimaging  [facility] [address], [address] [place], [place] [address] [phone]

## 2020-01-23 LAB — CBC WITH DIFFERENTIAL/PLATELET
Basophils Absolute: 0 10*3/uL (ref 0.0–0.2)
Basos: 0 %
EOS (ABSOLUTE): 0.1 10*3/uL (ref 0.0–0.4)
Eos: 2 %
Hematocrit: 38.4 % (ref 34.0–46.6)
Hemoglobin: 12.7 g/dL (ref 11.1–15.9)
Immature Grans (Abs): 0 10*3/uL (ref 0.0–0.1)
Immature Granulocytes: 1 %
Lymphocytes Absolute: 3.1 10*3/uL (ref 0.7–3.1)
Lymphs: 44 %
MCH: 29.3 pg (ref 26.6–33.0)
MCHC: 33.1 g/dL (ref 31.5–35.7)
MCV: 89 fL (ref 79–97)
Monocytes Absolute: 0.3 10*3/uL (ref 0.1–0.9)
Monocytes: 4 %
Neutrophils Absolute: 3.4 10*3/uL (ref 1.4–7.0)
Neutrophils: 49 %
Platelets: 197 10*3/uL (ref 150–450)
RBC: 4.34 x10E6/uL (ref 3.77–5.28)
RDW: 12.9 % (ref 11.7–15.4)
WBC: 7 10*3/uL (ref 3.4–10.8)

## 2020-02-03 NOTE — Telephone Encounter (Signed)
JCV ab collected on 01/22/20 indeterminate, index: 0.21. Inhibition assay: negative.

## 2020-02-12 ENCOUNTER — Other Ambulatory Visit: Payer: Self-pay

## 2020-02-12 ENCOUNTER — Ambulatory Visit (INDEPENDENT_AMBULATORY_CARE_PROVIDER_SITE_OTHER): Payer: Self-pay | Admitting: Neurology

## 2020-02-12 DIAGNOSIS — G35 Multiple sclerosis: Secondary | ICD-10-CM

## 2020-02-12 NOTE — Progress Notes (Signed)
She is here today for her Tysabri infusion.

## 2020-02-25 ENCOUNTER — Telehealth: Payer: Self-pay | Admitting: *Deleted

## 2020-02-25 NOTE — Telephone Encounter (Signed)
Faxed completed/signed Tysabri pt status report and reauth questionnaire to [facility] at [phone]. Received confirmation.   Received fax back that pt re-authorized for Tysabri via touch program from 02/25/20-09/23/20. Account: [employer]. Pt enrollment number: [id number]. Site auth number: [id number].

## 2020-03-11 ENCOUNTER — Ambulatory Visit (INDEPENDENT_AMBULATORY_CARE_PROVIDER_SITE_OTHER): Payer: Self-pay | Admitting: Neurology

## 2020-03-11 DIAGNOSIS — Z0289 Encounter for other administrative examinations: Secondary | ICD-10-CM

## 2020-04-08 ENCOUNTER — Ambulatory Visit (INDEPENDENT_AMBULATORY_CARE_PROVIDER_SITE_OTHER): Payer: Self-pay | Admitting: Neurology

## 2020-04-08 ENCOUNTER — Other Ambulatory Visit: Payer: Self-pay

## 2020-04-08 DIAGNOSIS — Z0289 Encounter for other administrative examinations: Secondary | ICD-10-CM

## 2020-04-14 ENCOUNTER — Telehealth: Payer: Self-pay

## 2020-04-14 NOTE — Telephone Encounter (Signed)
[name], RN in intrafusion informed me that pt has declined interpreter services for her infusions.

## 2020-07-02 ENCOUNTER — Telehealth: Payer: Self-pay | Admitting: *Deleted

## 2020-07-02 NOTE — Addendum Note (Signed)
Addended by: Tamera Stands D on: 07/02/2020 02:34 PM   Modules accepted: Orders

## 2020-07-02 NOTE — Telephone Encounter (Signed)
Placed JCV lab in quest lock box for routine lab pick up. Results pending. 

## 2020-07-03 LAB — CBC WITH DIFFERENTIAL/PLATELET
Basophils Absolute: 0 10*3/uL (ref 0.0–0.2)
Basos: 0 %
EOS (ABSOLUTE): 0.1 10*3/uL (ref 0.0–0.4)
Eos: 1 %
Hematocrit: 37.2 % (ref 34.0–46.6)
Hemoglobin: 12.6 g/dL (ref 11.1–15.9)
Immature Grans (Abs): 0 10*3/uL (ref 0.0–0.1)
Immature Granulocytes: 1 %
Lymphocytes Absolute: 2.1 10*3/uL (ref 0.7–3.1)
Lymphs: 28 %
MCH: 28.9 pg (ref 26.6–33.0)
MCHC: 33.9 g/dL (ref 31.5–35.7)
MCV: 85 fL (ref 79–97)
Monocytes Absolute: 0.5 10*3/uL (ref 0.1–0.9)
Monocytes: 6 %
Neutrophils Absolute: 4.9 10*3/uL (ref 1.4–7.0)
Neutrophils: 64 %
Platelets: 214 10*3/uL (ref 150–450)
RBC: 4.36 x10E6/uL (ref 3.77–5.28)
RDW: 13.1 % (ref 11.7–15.4)
WBC: 7.7 10*3/uL (ref 3.4–10.8)

## 2020-07-09 LAB — STRATIFY JCV AB (W/ INDEX) W/ RFLX
Index Value: 0.12
Stratify JCV (TM) Ab w/Reflex Inhibition: NEGATIVE

## 2020-07-16 NOTE — Telephone Encounter (Signed)
JCV ab drawn on 07/02/20 negative, index: 0.12

## 2020-07-23 ENCOUNTER — Encounter: Payer: Self-pay | Admitting: Neurology

## 2020-07-23 ENCOUNTER — Ambulatory Visit: Payer: Self-pay | Admitting: Neurology

## 2020-07-23 VITALS — BP 130/86 | HR 72 | Ht 65.0 in | Wt 239.5 lb

## 2020-07-23 DIAGNOSIS — Z79899 Other long term (current) drug therapy: Secondary | ICD-10-CM

## 2020-07-23 DIAGNOSIS — R260 Ataxic gait: Secondary | ICD-10-CM

## 2020-07-23 DIAGNOSIS — R200 Anesthesia of skin: Secondary | ICD-10-CM

## 2020-07-23 DIAGNOSIS — G35 Multiple sclerosis: Secondary | ICD-10-CM

## 2020-07-23 NOTE — Progress Notes (Signed)
[facility]  PATIENT: [name] Li DOB: 05-05-1981  REFERRING DOCTOR OR PCP:  Allyne Gee SOURCE: Patient, sister, interpreter, notes from Dr. Mordecai Maes, imaging reports, multiple MRI images personally reviewed.  _________________________________   HISTORICAL  CHIEF COMPLAINT:  Chief Complaint  Patient presents with  . Follow-up    RM 13 with interpreter. Last seen 01/22/2020. More active, more pain.   . Multiple Sclerosis    On Tysabri. Last infusion: 07/02/20, next: 07/30/20. Last JCV ab 07/02/20 negative, index: 0.12.     HISTORY OF PRESENT ILLNESS:  [name] Li is a 39 yo woman with relapsing remitting multiple sclerosis.  Update 07/23/20 She is on Tysabri and tolerates it well.   She remains JCV Ab negative.  No exacerbations or new neurologic symptoms.  She is walking closer to baseline.   She had one fall while mopping when her legs slipped.      She feels her legs are strong.    She notes a little morning in both legs but only in the morning and it gets better quickly.    She notes some eye pain and mild blurry vision that fluctuates but no constant visual issue.   She denies urinary dysfunction.  She denies much fatigue.   She is active and does all activities that she wants to do.  She sleeps well most nights but occasional has sleep onset insomnia if there are noises.     She denies any problems with mood or cognition.     MS history: She was diagnosed with MS March 2020 after presenting with neurologic symptoms and abnormal imaging studies.   In January 2020, she felt numbness in her right scalp and the entire left side with a heavy sensation in her left leg.   She also had mild issues with balance and gait.  Her right leg felt weak but the numbness and pain was more noted than weakness.  She had MRI studies initially of the lumbar spine which did not show significant degenerative changes but did show some abnormal signal in the  thoracic spinal cord concerning for demyelination.  She then had an MRI of the cervical and thoracic spine 11/30/2018 showing multiple lesions including some that enhance, consistent with MS.  She had an MRI of the brain on 12/03/2018 showing multiple lesions consistent with MS including one that enhanced.  She received 5 days of IV Solumedrol in a clinic.  She saw a neurologist in [place] and then had the infusions in [place]. She felt better after the steroids for the next few months. In retrospect she had numbness in her hands and legs at various times in 2019 and noted dysesthesias.     However, there were no imaging studies at that time.  No FH of MS.      I personally reviewed multiple imaging studies.  My summaries are: MRI brain 12/03/2018:   Multiple T2/flair hyperintense foci predominantly in the periventricular white matter with some in the in the juxtacortical white matter.  A large focus in the right medial temporal lobe enhances after contrast.  MRI cervical spine 12/01/2018: Extensive T2 hyperintense signal within the spinal cord.  Posteriorly to the left at C2, medially at C2-C3, posteriorly to the right at C3 and towards the right at C3 (this focus enhances), medially and posteriorly at C4-C5, left greater than right, anteriorly at C5-C6 (mild enhancement), medially at C6-C7.  MRI thoracic spine 12/01/2018: This study is degraded by motion.  Extensive foci throughout this  thoracic spine with probable enhancement at T2-T3, T6-T7 and T8-T9 (difficult to be certain due to motion and seen enhancement only on axial not sagittal views)  MRI lumbar spine 10/27/2018: Degenerative changes towards the right at L4-L5 but no nerve root compression.  Abnormal signal within the visible thoracic spinal cord concerning for demyelination.   REVIEW OF SYSTEMS: Constitutional: No fevers, chills, sweats, or change in appetite Eyes: No visual changes, double vision, eye pain Ear, nose and throat: No hearing  loss, ear pain, nasal congestion, sore throat Cardiovascular: No chest pain, palpitations Respiratory: No shortness of breath at rest or with exertion.   No wheezes GastrointestinaI: No nausea, vomiting, diarrhea, abdominal pain, fecal incontinence Genitourinary: No dysuria, urinary retention or frequency.  No nocturia. Musculoskeletal: No neck pain, back pain Integumentary: No rash, pruritus, skin lesions Neurological: as above Psychiatric: No depression at this time.  No anxiety Endocrine: No palpitations, diaphoresis, change in appetite, change in weigh or increased thirst Hematologic/Lymphatic: No anemia, purpura, petechiae. Allergic/Immunologic: No itchy/runny eyes, nasal congestion, recent allergic reactions, rashes  ALLERGIES: No Known Allergies  HOME MEDICATIONS:  Current Outpatient Medications:  .  DULoxetine (CYMBALTA) 60 MG capsule, Take 60 mg by mouth daily., Disp: , Rfl:  .  natalizumab (TYSABRI) 300 MG/15ML injection, Inject 300 mg into the vein every 28 (twenty-eight) days., Disp: , Rfl:   PAST MEDICAL HISTORY: Past Medical History:  Diagnosis Date  . Depression   . Gestational diabetes    glyburide  . Multiple sclerosis (HCC)     PAST SURGICAL HISTORY: Past Surgical History:  Procedure Laterality Date  . NO PAST SURGERIES      FAMILY HISTORY: Family History  Problem Relation Age of Onset  . Mental illness Mother        depression and OCD - sees psychiatrist  . Multiple births Mother        pregnant with twins but lost them  . Hypertension Father   . Diabetes Father   . Heart disease Father   . Diabetes Brother   . Thyroid disease Brother     SOCIAL HISTORY:  Social History   Socioeconomic History  . Marital status: Married    Spouse name: Not on file  . Number of children: Not on file  . Years of education: Not on file  . Highest education level: Not on file  Occupational History  . Not on file  Tobacco Use  . Smoking status: Never  Smoker  . Smokeless tobacco: Never Used  Vaping Use  . Vaping Use: Never used  Substance and Sexual Activity  . Alcohol use: No  . Drug use: No  . Sexual activity: Yes    Birth control/protection: None  Other Topics Concern  . Not on file  Social History Narrative  . Not on file   Social Determinants of Health   Financial Resource Strain:   . Difficulty of Paying Living Expenses: Not on file  Food Insecurity:   . Worried About Programme researcher, broadcasting/film/video in the Last Year: Not on file  . Ran Out of Food in the Last Year: Not on file  Transportation Needs:   . Lack of Transportation (Medical): Not on file  . Lack of Transportation (Non-Medical): Not on file  Physical Activity:   . Days of Exercise per Week: Not on file  . Minutes of Exercise per Session: Not on file  Stress:   . Feeling of Stress : Not on file  Social Connections:   .  Frequency of Communication with Friends and Family: Not on file  . Frequency of Social Gatherings with Friends and Family: Not on file  . Attends Religious Services: Not on file  . Active Member of Clubs or Organizations: Not on file  . Attends Banker Meetings: Not on file  . Marital Status: Not on file  Intimate Partner Violence:   . Fear of Current or Ex-Partner: Not on file  . Emotionally Abused: Not on file  . Physically Abused: Not on file  . Sexually Abused: Not on file     PHYSICAL EXAM  Vitals:   07/23/20 1430  BP: 130/86  Pulse: 72  Weight: 239 lb 8 oz (108.6 kg)  Height: 5\' 5"  (1.651 m)    Body mass index is 39.85 kg/m.   General: The patient is well-developed and well-nourished and in no acute distress  HEENT:  Head is [place]/AT.    Skin: Extremities are without rash or  edema.  Neurologic Exam  Mental status: The patient is alert and oriented x 3 at the time of the examination. The patient has apparent normal recent and remote memory, with an apparently normal attention span and concentration ability.    Speech is normal.  Cranial nerves: Extraocular movements are full.  There is good facial sensation to soft touch bilaterally.Facial strength is normal.  Trapezius and sternocleidomastoid strength is normal. No dysarthria is noted.     Hearing was symmetric  Motor:  Muscle bulk is normal.   Tone is normal. Strength is  5 / 5 in all 4 extremities.   Sensory: She has normal sensation to touch and vibration x 4 Coordination: Cerebellar testing reveals good finger-nose-finger and heel-to-shin bilaterally.  Gait and station: Station is normal.   Gait is fairly normal but tandem gait is wide  Romberg is negative.   Reflexes: Deep tendon reflexes are symmetric and normal bilaterally.     DIAGNOSTIC DATA (LABS, IMAGING, TESTING) - I reviewed patient records, labs, notes, testing and imaging myself where available.  Lab Results  Component Value Date   WBC 7.7 07/02/2020   HGB 12.6 07/02/2020   HCT 37.2 07/02/2020   MCV 85 07/02/2020   PLT 214 07/02/2020      Component Value Date/Time   NA 139 01/03/2018 1830   K 4.1 01/03/2018 1830   CL 106 01/03/2018 1830   CO2 24 01/03/2018 1830   GLUCOSE 97 01/03/2018 1830   BUN 10 01/03/2018 1830   CREATININE 0.64 01/03/2018 1830   CREATININE 0.49 (L) 02/29/2016 0001   CALCIUM 9.4 01/03/2018 1830   PROT 8.1 01/03/2018 1830   ALBUMIN 4.4 01/03/2018 1830   AST 20 01/03/2018 1830   ALT 26 01/03/2018 1830   ALKPHOS 52 01/03/2018 1830   BILITOT 1.2 01/03/2018 1830   GFRNONAA >60 01/03/2018 1830   GFRAA >60 01/03/2018 1830   No results found for: CHOL, HDL, LDLCALC, LDLDIRECT, TRIG, CHOLHDL Lab Results  Component Value Date   HGBA1C 5.7 (H) 02/29/2016   No results found for: 04/30/2016 Lab Results  Component Value Date   TSH 2.07 02/15/2016       ASSESSMENT AND PLAN  Multiple sclerosis (HCC) - Plan: MR BRAIN W WO CONTRAST  High risk medication use  Ataxic gait  Numbness   1.   Continue Tysabri.   Recent lab work was fine  and she is still JCV antibody negative.  Check MRI brain to determine if any breakthrough disease.   If so,  we need to consider a different DMT 2.    Stay active and exercise as tolerated. 3.   She will return to see me in 6 months or sooner if there are new or worsening neurologic symptoms.     Arend Bahl A. Epimenio Foot, MD, [facility] 07/23/2020, 5:38 PM Certified in Neurology, Clinical Neurophysiology, Sleep Medicine and Neuroimaging  [facility] [address] [place], [place] [address] [phone]

## 2020-07-27 ENCOUNTER — Telehealth: Payer: Self-pay | Admitting: Neurology

## 2020-07-27 NOTE — Telephone Encounter (Signed)
self pay order sent to [facility]. No auth they will reach out to the patient to schedule.  

## 2020-08-12 ENCOUNTER — Other Ambulatory Visit: Payer: Self-pay

## 2020-08-12 ENCOUNTER — Ambulatory Visit
Admission: RE | Admit: 2020-08-12 | Discharge: 2020-08-12 | Disposition: A | Discharge status: Home / Self Care | Payer: Self-pay | Source: Ambulatory Visit | Attending: Neurology | Admitting: Neurology

## 2020-08-12 DIAGNOSIS — G35 Multiple sclerosis: Secondary | ICD-10-CM

## 2020-08-12 MED ORDER — GADOBENATE DIMEGLUMINE 529 MG/ML IV SOLN
20.0000 mL | Freq: Once | INTRAVENOUS | Status: AC | PRN
  Administered 2020-08-12: 20 mL via INTRAVENOUS

## 2020-08-17 ENCOUNTER — Telehealth: Payer: Self-pay | Admitting: *Deleted

## 2020-08-17 NOTE — Telephone Encounter (Signed)
-----   Message from Asa Lente, MD sent at 08/13/2020  5:21 PM EST ----- Please let them know that the MRI of the brain did not show any brand-new lesions so I would want her to stay on Tysabri

## 2020-08-17 NOTE — Telephone Encounter (Signed)
Called [employer] at [phone]. Interpreter: Gloriajean Dell. ID# [id number]. They called pt and relayed results per Dr. Epimenio Foot note. Pt verbalized understanding. She had no further questions.

## 2020-08-24 ENCOUNTER — Telehealth: Payer: Self-pay | Admitting: *Deleted

## 2020-08-24 NOTE — Telephone Encounter (Signed)
Faxed completed/signed Tysabri pt status report and reauth questionnaire to [facility] at [phone]. Received confirmation.   Received fax back that pt re-authorized for Tysabri via touch program from 08/24/2020-03/25/2021. Account: [employer]. Pt enrollment number: [id number]. Site auth number: [id number].

## 2020-09-26 NOTE — L&D Delivery Note (Signed)
OB/GYN Faculty Practice Delivery Note  [name] Li is a 40 y.o. [id number] s/p SVD at [redacted]w[redacted]d. She was admitted for IOL due to severe pre-eclampsia.   ROM: 1h 43m with clear fluid GBS Status: GBS neg  Delivery Date/Time: 08/13/21 at 1227  Delivery: Called to room and patient was complete and pushing. Head delivered ROA. Loose nuchal cord present and easily reduced at the perineum. Shoulder and body delivered in usual fashion. Infant with spontaneous cry, placed on mother's abdomen, dried and stimulated. Cord clamped x 2 after 1-minute delay, and cut by FOB under my direct supervision. Cord blood drawn. Placenta delivered spontaneously with gentle cord traction. Fundus firm with massage and Pitocin. Labia, perineum, vagina, and cervix were inspected, and no lacerations were noted. Patient continued to have ongoing bleeding on subsequent fundal checks. Boggy lower uterine segment noted. Cytotec 1000 mcg per rectum given with improvement in bleeding and tone. Will continue to closely monitor prior to transfer to postpartum unit.  Placenta: Intact; 3VC - sent to L&D Complications: None  Lacerations: None  EBL: 705 cc Analgesia: Epidural   Infant: Viable female  APGARs 9 and 9  3100 g  Evalina Field, MD OB/GYN Fellow, Faculty Practice

## 2021-01-04 ENCOUNTER — Telehealth: Payer: Self-pay

## 2021-01-04 NOTE — Telephone Encounter (Signed)
We can hold the Tysabri.  She should keep the appointment on May 5 and will discuss things in more detail with them

## 2021-01-04 NOTE — Telephone Encounter (Signed)
We received a phone call from the patients sister wanting to schedule an appointment for her with Dr Epimenio Foot. I advised that the patient currently has a f/u on May 5. I asked what the appointment would be for, and the sister stated that [name] Li recently found out she was pregnant and wanted to discuss any concerns regarding her MS and the pregnancy. Can you please give this patient a call back to discuss her concerns/possibly schedule a sooner appointment?

## 2021-01-04 NOTE — Telephone Encounter (Signed)
Dr. Epimenio Foot- please advise. Pt on Tysabri.

## 2021-01-04 NOTE — Telephone Encounter (Signed)
Called [employer] at [phone]. Spoke with [place], [place]: [phone]. He called pt. Relayed Dr. Bonnita Hollow message below. She verbalized understanding. Aware we are holding her next infusion and she will discuss other options at next appt. Pt last Tysabri infusion: 12/22/20.

## 2021-01-26 ENCOUNTER — Encounter: Payer: Self-pay | Admitting: *Deleted

## 2021-01-26 ENCOUNTER — Encounter: Payer: Self-pay | Admitting: General Practice

## 2021-01-26 NOTE — Telephone Encounter (Signed)
Called [facility] prescribing program at [phone] and spoke with [place]. She will send message to Tysabri team to get d/c form faxed to [place] at [phone]

## 2021-01-28 ENCOUNTER — Encounter: Payer: Self-pay | Admitting: Neurology

## 2021-01-28 ENCOUNTER — Ambulatory Visit (INDEPENDENT_AMBULATORY_CARE_PROVIDER_SITE_OTHER): Payer: Self-pay | Admitting: Neurology

## 2021-01-28 ENCOUNTER — Other Ambulatory Visit: Payer: Self-pay

## 2021-01-28 VITALS — BP 124/69 | HR 88 | Ht 65.0 in | Wt 240.0 lb

## 2021-01-28 DIAGNOSIS — R200 Anesthesia of skin: Secondary | ICD-10-CM

## 2021-01-28 DIAGNOSIS — R260 Ataxic gait: Secondary | ICD-10-CM

## 2021-01-28 DIAGNOSIS — Z79899 Other long term (current) drug therapy: Secondary | ICD-10-CM

## 2021-01-28 DIAGNOSIS — G35 Multiple sclerosis: Secondary | ICD-10-CM

## 2021-01-28 NOTE — Progress Notes (Signed)
[facility]  PATIENT: [name] Li DOB: December 10, 1980  REFERRING DOCTOR OR PCP:  Allyne Gee SOURCE: Patient, sister, interpreter, notes from Dr. Mordecai Maes, imaging reports, multiple MRI images personally reviewed.  _________________________________   HISTORICAL  CHIEF COMPLAINT:  Chief Complaint  Patient presents with  . Follow-up    RM 14 w/ family and interpreter. Last seen 07/23/2020. Stopped Tysabri d/t being pregnant. She is [redacted]wks pregnant. Only taking prenatal vitamin, no other meds    HISTORY OF PRESENT ILLNESS:  [name] Li is a 40 yo woman with relapsing remitting multiple sclerosis.  Update 07/23/20 She was on Tysabri and tolerated it well.   She remains JCV Ab negative.  She became pregnant and we stopped the [place].   She is currently [redacted] weeks pregnant.  The pregnancy was not planned.  No exacerbations or new neurologic symptoms.  However, she notes more numbness in her hands.  She is walking well in general.  She has no recent falls.  She feels her legs are strong.   She notes a little morning in both legs but only in the morning and it gets better quickly.    She notes some eye pain and mild blurry vision that fluctuates but no constant visual issue.   She denies urinary dysfunction.  She denies much fatigue.   She is active and does all activities that she wants to do.  She sleeps well most nights but occasional has sleep onset insomnia if there are noises.     She denies any problems with mood or cognition.     MS history: She was diagnosed with MS March 2020 after presenting with neurologic symptoms and abnormal imaging studies.   In January 2020, she felt numbness in her right scalp and the entire left side with a heavy sensation in her left leg.   She also had mild issues with balance and gait.  Her right leg felt weak but the numbness and pain was more noted than weakness.  She had MRI studies initially of the lumbar spine  which did not show significant degenerative changes but did show some abnormal signal in the thoracic spinal cord concerning for demyelination.  She then had an MRI of the cervical and thoracic spine 11/30/2018 showing multiple lesions including some that enhance, consistent with MS.  She had an MRI of the brain on 12/03/2018 showing multiple lesions consistent with MS including one that enhanced.  She received 5 days of IV Solumedrol in a clinic.  She saw a neurologist in [place] and then had the infusions in [place]. She felt better after the steroids for the next few months. In retrospect she had numbness in her hands and legs at various times in 2019 and noted dysesthesias.     However, there were no imaging studies at that time.  No FH of MS.      Imaging: MRI of the brain 08/12/2020 showed T2/FLAIR hyperintense foci in the periventricular, juxtacortical and deep white matter of the cerebral hemispheres in a pattern and configuration consistent with chronic demyelinating plaque associated with multiple sclerosis.  None of the foci enhances or appears to be acute.  However, 2 of the chronic foci on the current MRI were not apparent on the 2020 MRI and likely occurred during the interim.  The abnormal enhancement associated with several of the lesions in 2020 has resolved and some of these foci are reduced in size compared to the previous MRI.  Normal enhancement pattern.  No acute  findings. ' MRI brain 12/03/2018:   Multiple T2/flair hyperintense foci predominantly in the periventricular white matter with some in the in the juxtacortical white matter.  A large focus in the right medial temporal lobe enhances after contrast.  MRI cervical spine 12/01/2018: Extensive T2 hyperintense signal within the spinal cord.  Posteriorly to the left at C2, medially at C2-C3, posteriorly to the right at C3 and towards the right at C3 (this focus enhances), medially and posteriorly at C4-C5, left greater than right,  anteriorly at C5-C6 (mild enhancement), medially at C6-C7.  MRI thoracic spine 12/01/2018: This study is degraded by motion.  Extensive foci throughout this thoracic spine with probable enhancement at T2-T3, T6-T7 and T8-T9 (difficult to be certain due to motion and seen enhancement only on axial not sagittal views)  MRI lumbar spine 10/27/2018: Degenerative changes towards the right at L4-L5 but no nerve root compression.  Abnormal signal within the visible thoracic spinal cord concerning for demyelination.   REVIEW OF SYSTEMS: Constitutional: No fevers, chills, sweats, or change in appetite Eyes: No visual changes, double vision, eye pain Ear, nose and throat: No hearing loss, ear pain, nasal congestion, sore throat Cardiovascular: No chest pain, palpitations Respiratory: No shortness of breath at rest or with exertion.   No wheezes GastrointestinaI: No nausea, vomiting, diarrhea, abdominal pain, fecal incontinence Genitourinary: No dysuria, urinary retention or frequency.  No nocturia. Musculoskeletal: No neck pain, back pain Integumentary: No rash, pruritus, skin lesions Neurological: as above Psychiatric: No depression at this time.  No anxiety Endocrine: No palpitations, diaphoresis, change in appetite, change in weigh or increased thirst Hematologic/Lymphatic: No anemia, purpura, petechiae. Allergic/Immunologic: No itchy/runny eyes, nasal congestion, recent allergic reactions, rashes  ALLERGIES: No Known Allergies  HOME MEDICATIONS:  Current Outpatient Medications:  .  Prenatal Vit-Fe Fumarate-FA (PRENATAL MULTIVITAMIN) TABS tablet, Take 1 tablet by mouth daily at 12 noon., Disp: , Rfl:   PAST MEDICAL HISTORY: Past Medical History:  Diagnosis Date  . Depression   . Gestational diabetes    glyburide  . Multiple sclerosis (HCC)     PAST SURGICAL HISTORY: Past Surgical History:  Procedure Laterality Date  . NO PAST SURGERIES      FAMILY HISTORY: Family History   Problem Relation Age of Onset  . Mental illness Mother        depression and OCD - sees psychiatrist  . Multiple births Mother        pregnant with twins but lost them  . Hypertension Father   . Diabetes Father   . Heart disease Father   . Diabetes Brother   . Thyroid disease Brother     SOCIAL HISTORY:  Social History   Socioeconomic History  . Marital status: Married    Spouse name: Not on file  . Number of children: Not on file  . Years of education: Not on file  . Highest education level: Not on file  Occupational History  . Not on file  Tobacco Use  . Smoking status: Never Smoker  . Smokeless tobacco: Never Used  Vaping Use  . Vaping Use: Never used  Substance and Sexual Activity  . Alcohol use: No  . Drug use: No  . Sexual activity: Yes    Birth control/protection: None  Other Topics Concern  . Not on file  Social History Narrative  . Not on file   Social Determinants of Health   Financial Resource Strain: Not on file  Food Insecurity: Not on file  Transportation Needs: Not  on file  Physical Activity: Not on file  Stress: Not on file  Social Connections: Not on file  Intimate Partner Violence: Not on file     PHYSICAL EXAM  Vitals:   01/28/21 1134  BP: 124/69  Pulse: 88  Weight: 240 lb (108.9 kg)  Height: 5\' 5"  (1.651 m)    Body mass index is 39.94 kg/m.   General: The patient is well-developed and well-nourished and in no acute distress  HEENT:  Head is [place]/AT.    Skin: Extremities are without rash or  edema.  Neurologic Exam  Mental status: The patient is alert and oriented x 3 at the time of the examination. The patient has apparent normal recent and remote memory, with an apparently normal attention span and concentration ability.   Speech is normal.  Cranial nerves: Extraocular movements are full.  There is good facial sensation to soft touch bilaterally.Facial strength is normal.  Trapezius and sternocleidomastoid strength is  normal. No dysarthria is noted.     Hearing was symmetric  Motor:  Muscle bulk is normal.   Tone is normal. Strength is  5 / 5 in all 4 extremities.   Sensory: She reports reduced sensation to touch in the hands.  Normal sensation reported elsewhere.  Coordination: Cerebellar testing reveals good finger-nose-finger and heel-to-shin bilaterally.  Gait and station: Station is normal.   Gait is fairly normal.  The tandem gait is slightly wide.  Romberg is negative.   Reflexes: Deep tendon reflexes are symmetric and normal bilaterally.     DIAGNOSTIC DATA (LABS, IMAGING, TESTING) - I reviewed patient records, labs, notes, testing and imaging myself where available.  Lab Results  Component Value Date   WBC 7.7 07/02/2020   HGB 12.6 07/02/2020   HCT 37.2 07/02/2020   MCV 85 07/02/2020   PLT 214 07/02/2020      Component Value Date/Time   NA 139 01/03/2018 1830   K 4.1 01/03/2018 1830   CL 106 01/03/2018 1830   CO2 24 01/03/2018 1830   GLUCOSE 97 01/03/2018 1830   BUN 10 01/03/2018 1830   CREATININE 0.64 01/03/2018 1830   CREATININE 0.49 (L) 02/29/2016 0001   CALCIUM 9.4 01/03/2018 1830   PROT 8.1 01/03/2018 1830   ALBUMIN 4.4 01/03/2018 1830   AST 20 01/03/2018 1830   ALT 26 01/03/2018 1830   ALKPHOS 52 01/03/2018 1830   BILITOT 1.2 01/03/2018 1830   GFRNONAA >60 01/03/2018 1830   GFRAA >60 01/03/2018 1830   No results found for: CHOL, HDL, LDLCALC, LDLDIRECT, TRIG, CHOLHDL Lab Results  Component Value Date   HGBA1C 5.7 (H) 02/29/2016   No results found for: 04/30/2016 Lab Results  Component Value Date   TSH 2.07 02/15/2016       ASSESSMENT AND PLAN  Multiple sclerosis (HCC)  High risk medication use  Ataxic gait  Numbness   1.   We discussed options.  Tysabri has been associated with hematologic abnormalities in newborns so I generally will stop the treatment.  She is concerned about the possibility of relapse off of Tysabri and I we will see if I  can get her Copaxone or glatiramer (likely safer during pregnancy) with a company sponsored free drug.  She does not plan to breast-feed and would like to return to 02/17/2016 shortly after childbirth.   2.    Stay active and exercise as tolerated. 3.   She will return to see me in 6 months or sooner if there are  new or worsening neurologic symptoms.     Pleshette Tomasini A. Epimenio Foot, MD, [facility] 01/28/2021, 6:49 PM Certified in Neurology, Clinical Neurophysiology, Sleep Medicine and Neuroimaging  [facility] [address], [address] [place] [address], [place] [address] [phone]

## 2021-02-01 ENCOUNTER — Telehealth: Payer: Self-pay

## 2021-02-01 NOTE — Telephone Encounter (Signed)
Received glatiramer start form from Dr. Epimenio Foot.  Faxed to [employer].  Patient does not have insurance.  Received a receipt of confirmation.  We will continue to follow.

## 2021-02-08 ENCOUNTER — Telehealth: Payer: Self-pay | Admitting: Neurology

## 2021-02-08 ENCOUNTER — Encounter: Payer: Self-pay | Admitting: Neurology

## 2021-02-08 ENCOUNTER — Telehealth (INDEPENDENT_AMBULATORY_CARE_PROVIDER_SITE_OTHER): Payer: Self-pay

## 2021-02-08 DIAGNOSIS — O09521 Supervision of elderly multigravida, first trimester: Secondary | ICD-10-CM

## 2021-02-08 DIAGNOSIS — O0990 Supervision of high risk pregnancy, unspecified, unspecified trimester: Secondary | ICD-10-CM | POA: Insufficient documentation

## 2021-02-08 DIAGNOSIS — O219 Vomiting of pregnancy, unspecified: Secondary | ICD-10-CM

## 2021-02-08 DIAGNOSIS — O99350 Diseases of the nervous system complicating pregnancy, unspecified trimester: Secondary | ICD-10-CM

## 2021-02-08 DIAGNOSIS — Z3A00 Weeks of gestation of pregnancy not specified: Secondary | ICD-10-CM

## 2021-02-08 DIAGNOSIS — G35 Multiple sclerosis: Secondary | ICD-10-CM

## 2021-02-08 MED ORDER — PROMETHAZINE HCL 25 MG PO TABS: 25.0000 mg | ORAL_TABLET | Freq: Four times a day (QID) | ORAL | 0 refills | Status: DC | PRN

## 2021-02-08 NOTE — Progress Notes (Signed)
New OB Intake  I connected with  [name] Li on 02/10/21 at 11:15 AM EDT by MyChart video and verified that I am speaking with the correct person using two identifiers. Nurse is located at [facility] and pt is located at home. [employer] interpreter [phone] present virtually for full encounter.  I discussed the limitations, risks, security and privacy concerns of performing an evaluation and management service by telephone and the availability of in person appointments. I also discussed with the patient that there may be a patient responsible charge related to this service. The patient expressed understanding and agreed to proceed.  I explained I am completing New OB Intake today. We discussed her EDD of 09/11/21 that is based on LMP of 12/05/20. Pt is G4/P3. I reviewed her allergies, medications, Medical/Surgical/OB history, and appropriate screenings. I informed her of [facility] services. Based on history, this pregnancy is complicated by multiple sclerosis and advanced maternal age.   Patient Active Problem List   Diagnosis Date Noted  . Advanced maternal age in multigravida 02/10/2021  . Supervision of high risk pregnancy, antepartum 02/08/2021  . High risk medication use 01/22/2020  . Multiple sclerosis (HCC) 07/04/2019  . Numbness 07/04/2019  . Ataxic gait 07/04/2019  . Acute blood loss anemia 07/12/2016  . Rh negative, antepartum 01/11/2016  . Obesity affecting pregnancy, antepartum 01/11/2016  . History of gestational diabetes in prior pregnancy, currently pregnant 01/02/2016    Concerns addressed today  Patient reports nausea; Phenergan ordered per protocol.   Delivery Plans:  Plans to deliver at [facility].   MyChart/Babyscripts Not discussed.  Blood Pressure Cuff Patient is self-pay; explained patient will be given BP cuff at first prenatal appt. Explained after first prenatal appt pt will check weekly.  Anatomy US Patient will be scheduled at [facility] [place] unless otherwise  determined by provider.  Labs Discussed Avelina Laine genetic screening with patient. Would like both Panorama and Horizon drawn at new OB visit. Routine prenatal labs needed.  COVID Vaccine Patient has not had COVID vaccine.   Social Determinants of Health . Food Insecurity: Patient denies food insecurity. . WIC Referral: Patient is interested in referral to [facility].  . Transportation: Patient denies transportation needs. . Childcare: Discussed no children allowed at ultrasound appointments. Offered childcare services; patient declines childcare services at this time.  First visit review I reviewed new OB appt with pt. I explained she will have a provider appointment that includes a physical exam and blood work with genetic screening. Pt will be seen by Nettie Elm, MD at first visit; encounter routed to appropriprate provider. Explained that patient will be seen by pregnancy navigator following visit with provider.  Marjo Bicker, RN  02/10/2021  2:36 PM

## 2021-02-08 NOTE — Telephone Encounter (Signed)
Received this notice in a separate phone note: "[employer] [facility] Lanora Manis) called, Pt has no insurance, we have directed patient to Patient Assistance Program. Can download form at [email or web address]. New enrollment forms for [employer]. Pt requested in nurse training, need the injection training box marked.   Contact info: [phone]"  I completed the [employer] Patient Assistance program application.  There is a form for the patient to sign.  The glatiramer will be shipped to our office for patient to pick up if she is approved. I called patient to discuss.  No answer, voicemail is not set up at this time.  I will try again later.

## 2021-02-08 NOTE — Telephone Encounter (Signed)
[email or web address]@ [employer] [facility] has called to report pt has been referred to Pt Assistance and if there are questions they can be called back at [phone] speak with anyone)

## 2021-02-08 NOTE — Telephone Encounter (Signed)
[employer] [facility] Lanora Manis) called, Pt has no insurance, we have directed patient to Patient Assistance Program. Can download form at [email or web address]. New enrollment forms for [employer]. Pt requested in nurse training, need the injection training box marked.  Contact info: [phone]

## 2021-02-08 NOTE — Telephone Encounter (Signed)
error 

## 2021-02-08 NOTE — Telephone Encounter (Signed)
There is already a phone note regarding pt's glatiramer. Please use the other note.

## 2021-02-09 NOTE — Telephone Encounter (Signed)
I called patient again to discuss.  Voicemail not set up yet, will try again later.

## 2021-02-10 DIAGNOSIS — O09529 Supervision of elderly multigravida, unspecified trimester: Secondary | ICD-10-CM | POA: Insufficient documentation

## 2021-02-10 NOTE — Progress Notes (Signed)
Agree with A & P. 

## 2021-02-15 NOTE — Telephone Encounter (Signed)
I called patient again to discuss.  Voicemail not set up, we will try again later.

## 2021-02-24 ENCOUNTER — Other Ambulatory Visit (HOSPITAL_COMMUNITY)
Admission: RE | Admit: 2021-02-24 | Discharge: 2021-02-24 | Disposition: A | Discharge status: Home / Self Care | Payer: No Typology Code available for payment source | Source: Ambulatory Visit | Attending: Obstetrics and Gynecology | Admitting: Obstetrics and Gynecology

## 2021-02-24 ENCOUNTER — Other Ambulatory Visit: Payer: Self-pay

## 2021-02-24 ENCOUNTER — Encounter: Payer: Self-pay | Admitting: Obstetrics and Gynecology

## 2021-02-24 ENCOUNTER — Ambulatory Visit (INDEPENDENT_AMBULATORY_CARE_PROVIDER_SITE_OTHER): Payer: Self-pay | Admitting: Obstetrics and Gynecology

## 2021-02-24 VITALS — BP 99/72 | HR 95 | Wt 245.8 lb

## 2021-02-24 DIAGNOSIS — O0990 Supervision of high risk pregnancy, unspecified, unspecified trimester: Secondary | ICD-10-CM | POA: Insufficient documentation

## 2021-02-24 DIAGNOSIS — G35 Multiple sclerosis: Secondary | ICD-10-CM

## 2021-02-24 DIAGNOSIS — O09299 Supervision of pregnancy with other poor reproductive or obstetric history, unspecified trimester: Secondary | ICD-10-CM

## 2021-02-24 DIAGNOSIS — Z6791 Unspecified blood type, Rh negative: Secondary | ICD-10-CM

## 2021-02-24 DIAGNOSIS — O09521 Supervision of elderly multigravida, first trimester: Secondary | ICD-10-CM

## 2021-02-24 DIAGNOSIS — Z8632 Personal history of gestational diabetes: Secondary | ICD-10-CM

## 2021-02-24 DIAGNOSIS — O26899 Other specified pregnancy related conditions, unspecified trimester: Secondary | ICD-10-CM

## 2021-02-24 MED ORDER — ASPIRIN EC 81 MG PO TBEC: 81.0000 mg | DELAYED_RELEASE_TABLET | Freq: Every day | ORAL | 2 refills | Status: DC

## 2021-02-24 NOTE — Telephone Encounter (Signed)
I called patient again to discuss.  Voicemail not set up, unable to leave a message.  This is my third unsuccessful attempt at reaching patient via phone.  I will send her a letter asking her to call me back.

## 2021-02-24 NOTE — Patient Instructions (Signed)
https://www.cdc.gov/pregnancy/infections.html">  Primer trimestre de embarazo First Trimester of Pregnancy  El primer trimestre de embarazo comienza el primer da de su ltimo periodo menstrual hasta el final de la semana 12. Tambin se dice que va desde el mes 1 hasta el mes 3 de embarazo. Durante el primer trimestre, ocurren cambios en el cuerpo Su organismo atraviesa por muchos cambios durante el embarazo. En general, los cambios vuelven a la normalidad despus del nacimiento del beb. Cambios fsicos  Usted puede aumentar o bajar de peso.  Las mamas pueden agrandarse y doler. La zona que rodea los pezones puede oscurecerse.  Pueden aparecer zonas oscuras o manchas en el rostro.  Tal vez haya cambios en el cabello. Cambios en la salud  Es posible que se sienta como si fuera a vomitar (nuseas) y quizs vomite.  Es posible que tenga acidez estomacal.  Es posible que tenga dolores de cabeza.  Es posible que tenga dificultades para defecar (estreimiento).  Las encas pueden sangrarle. Otros cambios  Es posible que se canse con facilidad.  Es posible que haga pis (orine) con mayor frecuencia.  Los perodos menstruales se interrumpirn.  Es posible que no tenga hambre.  Es posible que quiera comer ciertos tipos de alimentos.  Puede tener cambios en sus emociones de un da para otro.  Es posible que tenga ms sueos. Siga estas instrucciones en su casa: Medicamentos  Use los medicamentos de venta libre y los recetados solamente como se lo haya indicado el mdico. Algunos medicamentos no son seguros durante el embarazo.  Tome vitaminas prenatales que contengan por lo menos 600microgramos (mcg) de cido flico. Comida y bebida  Consuma comidas saludables que incluyan lo siguiente: ? Frutas y verduras frescas. ? Cereales integrales. ? Buenas fuentes de protenas, como carne, huevos y tofu. ? Productos lcteos con bajo contenido de grasa.  Evite la carne cruda y el  jugo, la leche y el queso sin pasteurizar.  Si se siente como si fuera a vomitar: ? Ingiera 4 o 5comidas pequeas por da en lugar de 3abundantes. ? Intente comer algunas galletitas saladas. ? Beba lquidos entre las comidas, en lugar de hacerlo durante estas.  Es posible que deba tomar medidas para prevenir o tratar los problemas para defecar: ? Beber suficiente lquido para mantener el pis (orina) de color amarillo plido. ? Come alimentos ricos en fibra. Entre ellos, frijoles, cereales integrales y frutas y verduras frescas. ? Limitar los alimentos con alto contenido de grasa y azcar. Estos incluyen alimentos fritos o dulces. Actividad  Haga ejercicios solamente como se lo haya indicado el mdico. La mayora de las personas pueden realizar su rutina de ejercicios habitual durante el embarazo.  Deje de hacer ejercicios si tiene clicos o dolor en la parte baja del vientre (abdomen) o en la cintura.  No haga ejercicio si hace demasiado calor, hay demasiada humedad o se encuentra en un lugar de mucha altura (altitud elevada).  Evite levantar pesos excesivos.  Si lo desea, puede continuar teniendo relaciones sexuales, a menos que el mdico le indique lo contrario. Alivio del dolor y del malestar  Use un sostn que le brinde buen soporte si le duelen las mamas.  Descanse con las piernas levantadas (elevadas) si tiene calambres en las piernas o dolor en la parte baja de la espalda.  Si tiene las venas de las piernas abultadas (venas varicosas): ? Use medias de compresin segn las indicaciones de su mdico. ? Levante los pies durante 15minutos, 3 o 4veces por da. ?   Limite la sal en sus alimentos. Seguridad  Use el cinturn de seguridad en todo momento mientras vaya en auto.  Hable con el mdico si alguien le est haciendo dao o gritando mucho.  Hable con el mdico si se siente triste o tiene pensamientos acerca de hacerse dao a usted misma. Estilo de vida  No se d baos  de inmersin en agua caliente, baos turcos ni saunas.  No se haga duchas vaginales. No use tampones ni toallas higinicas perfumadas.  No consuma medicamentos a base de hierbas, drogas ilegales, ni medicamentos que el mdico no haya autorizado. No beba alcohol.  No fume ni consuma ningn producto que contenga nicotina o tabaco. Si necesita ayuda para dejar de fumar, consulte al mdico.  Evite el contacto con las bandejas sanitarias de los gatos y la tierra que estos animales usan. Estos contienen grmenes que pueden daar al beb y causar la prdida del beb ya sea aborto espontneo o muerte fetal. Instrucciones generales  Cumpla con todas las visitas de seguimiento. Esto es importante.  Pida ayuda si necesita asesoramiento o asistencia con la alimentacin. El mdico puede aconsejarla o indicarle dnde recurrir para recibir ayuda.  Visite al dentista. En su casa, lvese los dientes con un cepillo dental suave. Psese el hilo dental suavemente.  Escriba sus preguntas. Llvelas cuando concurra a las visitas prenatales. Dnde buscar ms informacin  American Pregnancy Association (Asociacin Americana del Embarazo): americanpregnancy.org  American College of Obstetricians and Gynecologists (Colegio Estadounidense de Obstetras y Gineclogos): www.acog.org  Office on Women's Health (Oficina para la Salud de la Mujer): womenshealth.gov/pregnancy Comunquese con un mdico si:  Tiene mareos.  Tiene fiebre.  Tiene clicos leves o siente presin en la parte baja del vientre.  Sufre un dolor persistente en el abdomen.  Sigue sintiendo como si fuera a vomitar, vomita o hace deposiciones acuosas (diarrea) durante 24horas o ms.  Advierte lquido con mal olor que proviene de la vagina.  Siente dolor al orinar.  Se expone a una enfermedad que se transmite de una persona a otra, como varicela, sarampin, virus de Zika, VIH o hepatitis. Solicite ayuda de inmediato si:  Tiene sangrado o  pequeas prdidas vaginales.  Tiene clicos o dolor muy intensos en el vientre.  Le falta el aire o le duele el pecho.  Sufre cualquier tipo de lesin, por ejemplo, debido a una cada o un accidente automovilstico.  Tiene dolor, hinchazn o enrojecimiento nuevos en un brazo o una pierna o se produce un aumento de alguno de estos sntomas. Resumen  El primer trimestre del embarazo comienza el primer da de su ltimo periodo menstrual hasta el final de la semana 12 (meses 1 al 3).  Ingiera 4 o 5comidas pequeas por da en lugar de 3abundantes.  No fume ni consuma ningn producto que contenga nicotina o tabaco. Si necesita ayuda para dejar de fumar, consulte al mdico.  Cumpla con todas las visitas de seguimiento. Esta informacin no tiene como fin reemplazar el consejo del mdico. Asegrese de hacerle al mdico cualquier pregunta que tenga. Document Revised: 03/20/2020 Document Reviewed: 03/20/2020 Elsevier Patient Education  2021 Elsevier Inc.  

## 2021-02-24 NOTE — Progress Notes (Signed)
Subjective:  [name] Li is a 40 y.o. G4P3003 at [redacted]w[redacted]d being seen today for her first OB visit.  EDD by LMP. H/O MS, followed by [facility], currently no meds. H/O TSVD x 3. 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  She is currently monitored for the following issues for this high-risk pregnancy and has History of gestational diabetes in prior pregnancy, currently pregnant; Rh negative, antepartum; Obesity affecting pregnancy, antepartum; Multiple sclerosis (HCC); Numbness; Ataxic gait; High risk medication use; Supervision of high risk pregnancy, antepartum; and Advanced maternal age in multigravida on their problem list.  Patient reports nausea.  Contractions: Not present. Vag. Bleeding: None.  Movement: Absent. Denies leaking of fluid.   The following portions of the patient's history were reviewed and updated as appropriate: allergies, current medications, past family history, past medical history, past social history, past surgical history and problem list. Problem list updated.  Objective:   Vitals:   02/24/21 1409  BP: 99/72  Pulse: 95  Weight: 111.5 kg    Fetal Status: Fetal Heart Rate (bpm): 165   Movement: Absent     General:  Alert, oriented and cooperative. Patient is in no acute distress.  Skin: Skin is warm and dry. No rash noted.   Cardiovascular: Normal heart rate noted  Respiratory: Normal respiratory effort, no problems with respiration noted  Abdomen: Soft, gravid, appropriate for gestational age. Pain/Pressure: Present     Pelvic:  Cervical exam deferred        Extremities: Normal range of motion.  Edema: None  Mental Status: Normal mood and affect. Normal behavior. Normal judgment and thought content.   Urinalysis:      Assessment and Plan:  Pregnancy: G4P3003 at [redacted]w[redacted]d  1. Supervision of high risk pregnancy, antepartum Prenatal care and labs reviewed with pt - Culture, OB Urine - Hemoglobin A1c - ABO/Rh; Future - Antibody screen; Future - Hepatitis C  antibody - Hepatitis B surface antigen - HIV Antibody (routine testing w rflx) - RPR - Rubella screen - Cervicovaginal ancillary only( [facility]) - Antibody screen - ABO/Rh  2. Rh negative, antepartum Rhogam at 28 weeks and PP as indicated  3. History of gestational diabetes in prior pregnancy, currently pregnant A1c today  4. Multigravida of advanced maternal age in first trimester Discussed with pt Will start BASA qd - TSH - Comprehensive metabolic panel - Protein / creatinine ratio, urine - aspirin EC 81 MG tablet; Take 1 tablet (81 mg total) by mouth daily. Take after 12 weeks for prevention of preeclampsia later in pregnancy  Dispense: 300 tablet; Refill: 2  5. Multiple sclerosis (HCC) Stable No meds Followed by [facility], seen last month.  F/U in 6 months  Preterm labor symptoms and general obstetric precautions including but not limited to vaginal bleeding, contractions, leaking of fluid and fetal movement were reviewed in detail with the patient. Please refer to After Visit Summary for other counseling recommendations.  Return in about 4 weeks (around 03/24/2021) for OB visit, face to face, MD only.   Hermina Staggers, MD

## 2021-02-25 ENCOUNTER — Other Ambulatory Visit: Payer: Self-pay

## 2021-02-25 ENCOUNTER — Other Ambulatory Visit: Payer: Self-pay | Admitting: Obstetrics & Gynecology

## 2021-02-25 ENCOUNTER — Other Ambulatory Visit: Payer: Self-pay | Admitting: Obstetrics and Gynecology

## 2021-02-25 ENCOUNTER — Telehealth: Payer: Self-pay

## 2021-02-25 DIAGNOSIS — O219 Vomiting of pregnancy, unspecified: Secondary | ICD-10-CM

## 2021-02-25 LAB — HEMOGLOBIN A1C
Est. average glucose Bld gHb Est-mCnc: 128 mg/dL
Hgb A1c MFr Bld: 6.1 % — ABNORMAL HIGH (ref 4.8–5.6)

## 2021-02-25 LAB — ABO/RH: Rh Factor: NEGATIVE

## 2021-02-25 LAB — ANTIBODY SCREEN: Antibody Screen: NEGATIVE

## 2021-02-25 LAB — RPR: RPR Ser Ql: NONREACTIVE

## 2021-02-25 LAB — HEPATITIS C ANTIBODY
HCV Ab: NEGATIVE
Hep C Virus Ab: <0.1 s/co ratio (ref 0.0–0.9)

## 2021-02-25 LAB — HEPATITIS B SURFACE ANTIGEN: Hepatitis B Surface Ag: NEGATIVE

## 2021-02-25 LAB — HIV ANTIBODY (ROUTINE TESTING W REFLEX): HIV Screen 4th Generation wRfx: NONREACTIVE

## 2021-02-25 LAB — PROTEIN / CREATININE RATIO, URINE
Creatinine, Urine: 220.7 mg/dL
Protein, Ur: 42.3 mg/dL
Protein/Creat Ratio: 192 mg/g creat (ref 0–200)

## 2021-02-25 LAB — RUBELLA SCREEN: Rubella Antibodies, IGG: 29.8 index (ref >0.99)

## 2021-02-25 MED ORDER — PROMETHAZINE HCL 25 MG PO TABS: 25.0000 mg | ORAL_TABLET | Freq: Four times a day (QID) | ORAL | 1 refills | Status: DC | PRN

## 2021-02-25 MED ORDER — PROMETHAZINE HCL 25 MG PO TABS: 25.0000 mg | ORAL_TABLET | Freq: Four times a day (QID) | ORAL | 0 refills | Status: DC | PRN

## 2021-02-25 NOTE — Progress Notes (Signed)
Called [facility] To schedule Pts [facility], they advised that the calendar for July is not available yet & to call them back in 2 weeks.

## 2021-02-25 NOTE — Telephone Encounter (Signed)
Pt called Nurse line today at 3:13p, stating never received her refill request for Phenergan, so sent Refill to pharmacy on file. Pt verbalized understanding.

## 2021-02-26 LAB — CULTURE, OB URINE

## 2021-02-26 LAB — CERVICOVAGINAL ANCILLARY ONLY
Chlamydia: NEGATIVE
Comment: NEGATIVE
Comment: NORMAL
Neisseria Gonorrhea: NEGATIVE

## 2021-02-26 LAB — URINE CULTURE, OB REFLEX

## 2021-03-01 ENCOUNTER — Telehealth: Payer: Self-pay

## 2021-03-01 NOTE — Telephone Encounter (Addendum)
-----   Message from Hermina Staggers, MD sent at 02/26/2021  2:12 PM EDT ----- Please schedule pt for early GTT in the next 1-2 weeks. Thanks Jamesetta [place] pt with interpreter Cicero Duck. A1C result discussed and need for early 2 hour glucose test. Pt agreeable to lab appt this Wednesday morning. Front office notified. Pt notified nothing to eat or drink prior to appt starting at [place].

## 2021-03-02 ENCOUNTER — Other Ambulatory Visit: Payer: Self-pay | Admitting: General Practice

## 2021-03-02 DIAGNOSIS — O0990 Supervision of high risk pregnancy, unspecified, unspecified trimester: Secondary | ICD-10-CM

## 2021-03-03 ENCOUNTER — Other Ambulatory Visit: Payer: No Typology Code available for payment source

## 2021-03-03 ENCOUNTER — Other Ambulatory Visit: Payer: Self-pay

## 2021-03-03 DIAGNOSIS — O0990 Supervision of high risk pregnancy, unspecified, unspecified trimester: Secondary | ICD-10-CM

## 2021-03-04 ENCOUNTER — Encounter: Payer: Self-pay | Admitting: Obstetrics and Gynecology

## 2021-03-04 DIAGNOSIS — O24419 Gestational diabetes mellitus in pregnancy, unspecified control: Secondary | ICD-10-CM | POA: Insufficient documentation

## 2021-03-04 LAB — COMPREHENSIVE METABOLIC PANEL
ALT: 13 IU/L (ref 0–32)
AST: 12 IU/L (ref 0–40)
Albumin/Globulin Ratio: 1.8 (ref 1.2–2.2)
Albumin: 4.2 g/dL (ref 3.8–4.8)
Alkaline Phosphatase: 61 IU/L (ref 44–121)
BUN/Creatinine Ratio: 4 — ABNORMAL LOW (ref 9–23)
BUN: 3 mg/dL — ABNORMAL LOW (ref 6–20)
Bilirubin Total: 0.4 mg/dL (ref 0.0–1.2)
CO2: 16 mmol/L — ABNORMAL LOW (ref 20–29)
Calcium: 9.3 mg/dL (ref 8.7–10.2)
Chloride: 100 mmol/L (ref 96–106)
Creatinine, Ser: 0.7 mg/dL (ref 0.57–1.00)
Globulin, Total: 2.4 g/dL (ref 1.5–4.5)
Glucose: 247 mg/dL — ABNORMAL HIGH (ref 65–99)
Potassium: 3.8 mmol/L (ref 3.5–5.2)
Sodium: 135 mmol/L (ref 134–144)
Total Protein: 6.6 g/dL (ref 6.0–8.5)
eGFR: 113 mL/min/{1.73_m2} (ref >59)

## 2021-03-04 LAB — GLUCOSE TOLERANCE, 2 HOURS W/ 1HR
Glucose, 1 hour: 254 mg/dL — ABNORMAL HIGH (ref 65–179)
Glucose, 2 hour: 189 mg/dL — ABNORMAL HIGH (ref 65–152)
Glucose, Fasting: 103 mg/dL — ABNORMAL HIGH (ref 65–91)

## 2021-03-04 LAB — TSH: TSH: 1.96 u[IU]/mL (ref 0.450–4.500)

## 2021-03-05 ENCOUNTER — Telehealth: Payer: Self-pay

## 2021-03-05 DIAGNOSIS — O24419 Gestational diabetes mellitus in pregnancy, unspecified control: Secondary | ICD-10-CM

## 2021-03-05 NOTE — Telephone Encounter (Addendum)
-----   Message from Hermina Staggers, MD sent at 03/04/2021  6:56 PM EDT ----- Please let Ms [name] Li know that she has GDM. Please refer her for Diabetic education and send in all necessary supplies to her pharmacy.  Thanks [employer] pt with Spanish interpreter [name]. Results given. Diabetes education appt scheduled for 03/16/21 at 0815. Supplies will be given to pt at this appt.

## 2021-03-08 ENCOUNTER — Telehealth: Payer: Self-pay

## 2021-03-08 NOTE — Telephone Encounter (Signed)
Called [facility] to sched. Pt 18/19 week U/S, THEY Advised Calender is not available for July yet, to call back @ end of month.

## 2021-03-16 ENCOUNTER — Encounter: Payer: No Typology Code available for payment source | Attending: Obstetrics and Gynecology | Admitting: Registered"

## 2021-03-16 ENCOUNTER — Ambulatory Visit: Payer: No Typology Code available for payment source | Admitting: Registered"

## 2021-03-16 ENCOUNTER — Other Ambulatory Visit: Payer: Self-pay

## 2021-03-16 ENCOUNTER — Encounter: Payer: No Typology Code available for payment source | Admitting: Registered"

## 2021-03-16 DIAGNOSIS — Z3A00 Weeks of gestation of pregnancy not specified: Secondary | ICD-10-CM | POA: Insufficient documentation

## 2021-03-16 DIAGNOSIS — O24419 Gestational diabetes mellitus in pregnancy, unspecified control: Secondary | ICD-10-CM | POA: Insufficient documentation

## 2021-03-16 NOTE — Progress Notes (Signed)
Interpreter services provided by [name] from [facility]  Patient was seen on 03/16/21 for Gestational Diabetes self-management. EDD 09/11/21; [redacted]w[redacted]d; . Patient states prior history of GDM 4 yrs ago  Pt states previous GDM managed with oral medication OGTT: all three elevated 03/03/21 A1c 6.1% 02/24/21 Current medication: none  Pt states she has MS and when started medication/steroids gained 40 lbs. Pt states MS symptoms have improved with pregnancy. Pt reports was UP and due to MS condition has experienced some depression. Pt declines offer for [facility] referral. Pt states physical activity is limited due to pain in back from MS. Pt declined referral for physical therapy.  Diet history obtained. Patient eats limited variety food due to N/V. Beverages include Water, coffee with 1/2 tsp sugar, a little juice.    The following topics covered in first visit :  States the definition of Gestational Diabetes A1c and pre-diabetes Exercise Stress  Plan:  If OK with your MD, consider  increasing your activity level by walking, Arm Chair Exercises or other activity daily as tolerated Begin checking Blood Glucose before breakfast and 2 hours after first bite of breakfast, lunch and dinner as directed by MD  Bring Log Book/Sheet and meter to every medical appointment  Take medication if directed by MD  Blood glucose monitor given: [employer] Lot # [id number] CBG: 122 mg/dL  Patient instructed to monitor glucose levels: FBS: 60 - 95 mg/dl 2 hour: <973 mg/dl  Patient received the following handouts: Nutrition Diabetes and Pregnancy Carbohydrate Counting List Blood glucose Log Sheet  Patient will be seen for follow-up in 1 weeks or as needed.

## 2021-03-22 ENCOUNTER — Other Ambulatory Visit: Payer: Self-pay

## 2021-03-24 ENCOUNTER — Other Ambulatory Visit: Payer: Self-pay

## 2021-03-24 ENCOUNTER — Ambulatory Visit (INDEPENDENT_AMBULATORY_CARE_PROVIDER_SITE_OTHER): Payer: Self-pay | Admitting: Family Medicine

## 2021-03-24 VITALS — BP 119/77 | HR 93 | Wt 244.9 lb

## 2021-03-24 DIAGNOSIS — O0990 Supervision of high risk pregnancy, unspecified, unspecified trimester: Secondary | ICD-10-CM

## 2021-03-24 DIAGNOSIS — O99210 Obesity complicating pregnancy, unspecified trimester: Secondary | ICD-10-CM

## 2021-03-24 DIAGNOSIS — O09522 Supervision of elderly multigravida, second trimester: Secondary | ICD-10-CM

## 2021-03-24 DIAGNOSIS — O24419 Gestational diabetes mellitus in pregnancy, unspecified control: Secondary | ICD-10-CM

## 2021-03-24 DIAGNOSIS — G35 Multiple sclerosis: Secondary | ICD-10-CM

## 2021-03-24 NOTE — Patient Instructions (Signed)
Segundo trimestre de embarazo °Second Trimester of Pregnancy °El segundo trimestre de embarazo va desde la semana 13 hasta la semana 27. Es decir desde el mes 4 hasta el mes 6 de embarazo. El segundo trimestre suele ser el momento en el que mejor se siente. Su organismo se ha adaptado a estar embarazada, y comienza a sentirse físicamente mejor. °Durante el segundo trimestre: °Las náuseas del embarazo han disminuido o han desaparecido completamente. °Usted puede tener más energía. °Es posible que tenga un aumento del apetito. °El segundo trimestre es también un período en el que el bebé en gestación (feto) crece rápidamente. Hacia el final del sexto mes, el feto puede medir aproximadamente 12 pulgadas y pesar alrededor de 1½ libras. Es probable que sienta que el bebé se mueve (da pataditas) entre las 16 y 20 semanas del embarazo. °Cambios en el cuerpo durante el segundo trimestre °Su cuerpo continua experimentando numerosos cambios durante su segundo trimestre. Los cambios varían y generalmente vuelven a la normalidad después del nacimiento del bebé. °Cambios físicos °Seguirá aumentando de peso. Notará que la parte baja del abdomen sobresale. °Podrán aparecer las primeras estrías en las caderas, el abdomen y las mamas. °Las mamas seguirán creciendo y se tornarán sensibles. °Pueden aparecer zonas oscuras o manchas (cloasma o máscara del embarazo) en el rostro. °Es posible que se forme una línea oscura desde el ombligo hasta la zona del pubis (linea nigra). °Tal vez haya cambios en el cabello. Esto cambios pueden incluir su engrosamiento, crecimiento rápido y cambios en la textura. A algunas personas también se les cae el cabello durante o después del embarazo, o tienen el cabello seco o fino. °Cambios en la salud °Comienza a tener dolores de cabeza. °Es posible que tenga acidez estomacal. °Puede tener estreñimiento. °Pueden aparecer hemorroides o abultarse e hincharse las venas (venas varicosas). °Las encías pueden  sangrar y estar sensibles al cepillado y al hilo dental. °Tal vez tenga necesidad de orinar con más frecuencia porque el feto está ejerciendo presión sobre la vejiga. °Puede sentir dolor en la espalda. Esto se debe a: °Aumento de peso. °Las hormonas del embarazo relajan las articulaciones en la pelvis. °Un cambio en el peso y los músculos que ayudan a mantener su equilibrio. °Siga estas instrucciones en su casa: °Medicamentos °Siga las instrucciones del médico en relación con el uso de medicamentos. Durante el embarazo, hay medicamentos que pueden tomarse y otros que no. No tome medicamentos a menos que lo haya autorizado el médico. °Tome vitaminas prenatales que contengan por lo menos 600 microgramos (mcg) de ácido fólico. °Comida y bebida °Lleve una dieta saludable que incluya frutas y verduras frescas, cereales integrales, buenas fuentes de proteínas como carnes magras, huevos o tofu, y productos lácteos descremados. °Evite la carne cruda y el jugo, la leche y el queso sin pasteurizar. Estos portan gérmenes que pueden provocar daño tanto a usted como al bebé. °Es posible que tenga que tomar estas medidas para prevenir o tratar el estreñimiento: °Beber suficiente líquido como para mantener la orina de color amarillo pálido. °Consumir alimentos ricos en fibra, como frijoles, cereales integrales, y frutas y verduras frescas. °Limitar el consumo de alimentos ricos en grasa y azúcares procesados, como los alimentos fritos o dulces. °Actividad °Haga ejercicio solamente como se lo haya indicado el médico. La mayoría de las personas pueden continuar su rutina de ejercicios durante el embarazo. Intente realizar como mínimo 30 minutos de actividad física por lo menos 5 días a la semana. Deje de hacer ejercicio si comienza a   tener contracciones en el útero. °Deje de hacer ejercicio si le aparecen dolor o cólicos en la parte baja del vientre o de la espalda. °Evite hacer ejercicio si hace mucho calor o humedad, o si se  encuentra a una altitud elevada. °Evite levantar pesos excesivos. °Si lo desea, puede seguir teniendo relaciones sexuales, salvo que el médico le indique lo contrario. °Alivio del dolor y del malestar °Use un sujetador que le brinde buen soporte para prevenir las molestias causadas por la sensibilidad en las mamas. °Dese baños de asiento con agua tibia para aliviar el dolor o las molestias causadas por las hemorroides. Use una crema para las hemorroides si el médico la autoriza. °Descanse con las piernas levantadas (elevadas) si tiene calambres en las piernas o dolor en la parte baja de la espalda. °Si tiene venas varicosas: °Use medias de compresión como se lo haya indicado el médico. °Eleve los pies durante 15 minutos, 3 o 4 veces por día. °Limite el consumo de sal en su dieta. °Seguridad °Use el cinturón de seguridad en todo momento mientras conduce o va en auto. °Hable con el médico si es víctima de maltrato verbal o físico. °Estilo de vida °No se dé baños de inmersión en agua caliente, baños turcos ni saunas. °No se haga duchas vaginales. No use tampones ni toallas higiénicas perfumadas. °Evite el contacto con las bandejas sanitarias de los gatos y la tierra que estos animales usan. Estos elementos contienen bacterias que pueden causar defectos congénitos al bebé y la posible pérdida del feto debido a un aborto espontáneo o muerte fetal. °No use remedios a base de hierbas, alcohol, drogas ilegales ni medicamentos que no estén aprobados por el médico. Las sustancias químicas de estos productos pueden dañar al bebé. °No consuma ningún producto que contenga nicotina o tabaco, como cigarrillos, cigarrillos electrónicos y tabaco de mascar. Si necesita ayuda para dejar de fumar, consulte al médico. °Instrucciones generales °Durante una visita prenatal de rutina, el médico le hará un examen físico y otras pruebas. También le hablará sobre su salud general. Cumpla con todas las visitas de seguimiento. Esto es  importante. °Pídale al médico que la derive a clases de educación prenatal en su localidad. °Pida ayuda si tiene necesidades nutricionales o de asesoramiento durante el embarazo. El médico puede aconsejarla o derivarla a especialistas para que la ayuden con diferentes necesidades. °Dónde buscar más información °American Pregnancy Association (Asociación Americana del Embarazo): americanpregnancy.org °American College of Obstetricians and Gynecologists (Colegio Estadounidense de Obstetras y Ginecólogos): acog.org/womens-health/pregnancy? °Office on Women's Health (Oficina para la Salud de la Mujer): womenshealth.gov/pregnancy °Comuníquese con un médico si tiene: °Un dolor de cabeza que no desaparece después de tomar analgésicos. °Cambios en la visión o ve manchas delante de los ojos. °Cólicos leves, presión en la pelvis o dolor persistente en el abdomen. °Náuseas persistentes, vómitos o diarrea. °Secreción vaginal con mal olor u orina con olor fétido. °Dolor al orinar. °Hinchazón súbita o extrema del rostro, las manos, los tobillos, los pies o las piernas. °Fiebre. °Busque ayuda de inmediato si: °Tiene una pérdida de líquido por la vagina. °Tiene sangrado ligero o manchas vaginales. °Tiene dolor intenso o cólicos en el abdomen. °Presenta dificultad para respirar. °Siente dolor en el pecho. °Tiene episodios de desmayo. °No ha sentido a su bebé moverse durante el período de tiempo que le indicó el médico. °Tiene dolor, hinchazón o enrojecimiento nuevos o más intensos en un brazo o una pierna. °Resumen °El segundo trimestre de embarazo va desde la semana 13 hasta la 27 (  desde el mes 4 hasta el 6). °No use remedios a base de hierbas, alcohol, drogas ilegales ni medicamentos que no estén aprobados por el médico. Las sustancias químicas de estos productos pueden dañar al bebé. °Haga ejercicio solamente como se lo haya indicado el médico. La mayoría de las personas pueden continuar su rutina de ejercicios durante el  embarazo. °Cumpla con todas las visitas de seguimiento. Esto es importante. °Esta información no tiene como fin reemplazar el consejo del médico. Asegúrese de hacerle al médico cualquier pregunta que tenga. °Document Revised: 03/23/2020 Document Reviewed: 03/23/2020 °Elsevier Patient Education © 2022 Elsevier Inc. ° °

## 2021-03-24 NOTE — Progress Notes (Signed)
   PRENATAL VISIT NOTE  Subjective:  [name] Li is a 40 y.o. G4P3003 at [redacted]w[redacted]d being seen today for ongoing prenatal care.  She is currently monitored for the following issues for this low-risk pregnancy and has Rh negative, antepartum; Obesity affecting pregnancy, antepartum; Multiple sclerosis (HCC); Numbness; Ataxic gait; High risk medication use; Supervision of high risk pregnancy, antepartum; Advanced maternal age in multigravida; and Gestational diabetes on their problem list.  Patient reports no complaints.  Contractions: Not present. Vag. Bleeding: None.  Movement: Absent. Denies leaking of fluid.   The following portions of the patient's history were reviewed and updated as appropriate: allergies, current medications, past family history, past medical history, past social history, past surgical history and problem list.   Objective:   Vitals:   03/24/21 1055  BP: 119/77  Pulse: 93  Weight: 244 lb 14.4 oz (111.1 kg)    Fetal Status: Fetal Heart Rate (bpm): 158   Movement: Absent     General:  Alert, oriented and cooperative. Patient is in no acute distress.  Skin: Skin is warm and dry. No rash noted.   Cardiovascular: Normal heart rate noted  Respiratory: Normal respiratory effort, no problems with respiration noted  Abdomen: Soft, gravid, appropriate for gestational age.  Pain/Pressure: Present     Pelvic: Cervical exam deferred        Extremities: Normal range of motion.  Edema: None  Mental Status: Normal mood and affect. Normal behavior. Normal judgment and thought content.   Assessment and Plan:  Pregnancy: G4P3003 at [redacted]w[redacted]d 1. Supervision of high risk pregnancy, antepartum Continue prenatal care.  - AFP, Serum, Open Spina Bifida - [place] MFM OB DETAIL +14 WK; Future  2. Multigravida of advanced maternal age in second trimester Declined genetics - Adopt A Mom  3. Gestational diabetes mellitus (GDM), antepartum, gestational diabetes method of control  unspecified Needs Detailed anatomy u/s due to GDM in 2nd trimester No sugars today, to meet with CDE tomorrow to review diet To return for CBG review in 2 wks. - [place] MFM OB DETAIL +14 WK; Future  4. Multiple sclerosis [facility]) Sees Neurology, previously on Tysarbri  5. Obesity affecting pregnancy, antepartum  General obstetric precautions including but not limited to vaginal bleeding, contractions, leaking of fluid and fetal movement were reviewed in detail with the patient. Please refer to After Visit Summary for other counseling recommendations.   Return in about 2 weeks (around 04/07/2021) for [facility], needs MD.  Future Appointments  Date Time Provider Department Center  04/12/2021 10:15 AM Warden Fillers, MD [facility]  04/20/2021 12:45 PM [facility] NURSE [facility]  04/20/2021  1:00 PM [facility] US1 [facility]  06/30/2021 11:00 AM Sater, Pearletha Furl, MD [facility] None    Reva Bores, MD

## 2021-03-26 ENCOUNTER — Telehealth: Payer: Self-pay

## 2021-03-26 LAB — AFP, SERUM, OPEN SPINA BIFIDA
AFP MoM: 1.12
AFP Value: 26.7 ng/mL
Gest. Age on Collection Date: 15.4 weeks
Maternal Age At EDD: 39.4 yr
OSBR Risk 1 IN: 8371
Test Results:: NEGATIVE
Weight: 245 [lb_av]

## 2021-03-26 NOTE — Telephone Encounter (Signed)
Called Pt using [employer] id# [phone] to advise of U/S appt at health dept. On 04/12/21 @ 1pm, no answer & VM not set up. Will retry later.

## 2021-03-30 ENCOUNTER — Telehealth: Payer: Self-pay

## 2021-03-30 NOTE — Telephone Encounter (Signed)
2nd attempt to contact Pt to give [facility] U/S APPT date & time. Received message that VM has not been set up. Will send Pt a letter by mail.

## 2021-04-12 ENCOUNTER — Ambulatory Visit (INDEPENDENT_AMBULATORY_CARE_PROVIDER_SITE_OTHER): Payer: Self-pay | Admitting: Obstetrics and Gynecology

## 2021-04-12 ENCOUNTER — Other Ambulatory Visit: Payer: Self-pay

## 2021-04-12 VITALS — BP 103/76 | HR 108 | Wt 246.0 lb

## 2021-04-12 DIAGNOSIS — O99210 Obesity complicating pregnancy, unspecified trimester: Secondary | ICD-10-CM

## 2021-04-12 DIAGNOSIS — O219 Vomiting of pregnancy, unspecified: Secondary | ICD-10-CM

## 2021-04-12 DIAGNOSIS — Z6791 Unspecified blood type, Rh negative: Secondary | ICD-10-CM

## 2021-04-12 DIAGNOSIS — O26899 Other specified pregnancy related conditions, unspecified trimester: Secondary | ICD-10-CM

## 2021-04-12 DIAGNOSIS — O0990 Supervision of high risk pregnancy, unspecified, unspecified trimester: Secondary | ICD-10-CM

## 2021-04-12 DIAGNOSIS — O24415 Gestational diabetes mellitus in pregnancy, controlled by oral hypoglycemic drugs: Secondary | ICD-10-CM

## 2021-04-12 DIAGNOSIS — G35 Multiple sclerosis: Secondary | ICD-10-CM

## 2021-04-12 DIAGNOSIS — O09522 Supervision of elderly multigravida, second trimester: Secondary | ICD-10-CM

## 2021-04-12 DIAGNOSIS — Z3A18 18 weeks gestation of pregnancy: Secondary | ICD-10-CM

## 2021-04-12 HISTORY — DX: Vomiting of pregnancy, unspecified: O21.9

## 2021-04-12 MED ORDER — PROMETHAZINE HCL 25 MG PO TABS: 25.0000 mg | ORAL_TABLET | Freq: Four times a day (QID) | ORAL | 1 refills | Status: DC | PRN

## 2021-04-12 MED ORDER — METFORMIN HCL 500 MG PO TABS: 500.0000 mg | ORAL_TABLET | Freq: Two times a day (BID) | ORAL | 5 refills | Status: DC

## 2021-04-12 NOTE — Progress Notes (Signed)
   PRENATAL VISIT NOTE  Subjective:  [name] Li is a 40 y.o. G4P3003 at [redacted]w[redacted]d being seen today for ongoing prenatal care.  She is currently monitored for the following issues for this high-risk pregnancy and has Rh negative, antepartum; Obesity affecting pregnancy, antepartum; Multiple sclerosis (HCC); Numbness; Ataxic gait; High risk medication use; Supervision of high risk pregnancy, antepartum; Advanced maternal age in multigravida; Gestational diabetes; and [redacted] weeks gestation of pregnancy on their problem list.  Patient doing well with no acute concerns today. She reports nausea.  Contractions: Not present. Vag. Bleeding: None.  Movement: Present. Denies leaking of fluid.   The following portions of the patient's history were reviewed and updated as appropriate: allergies, current medications, past family history, past medical history, past social history, past surgical history and problem list. Problem list updated.  Objective:   Vitals:   04/12/21 1037  BP: 103/76  Pulse: (!) 108  Weight: 246 lb (111.6 kg)    Fetal Status: Fetal Heart Rate (bpm): 154   Movement: Present     General:  Alert, oriented and cooperative. Patient is in no acute distress.  Skin: Skin is warm and dry. No rash noted.   Cardiovascular: Normal heart rate noted  Respiratory: Normal respiratory effort, no problems with respiration noted  Abdomen: Soft, gravid, appropriate for gestational age.  Pain/Pressure: Present     Pelvic: Cervical exam deferred        Extremities: Normal range of motion.  Edema: None  Mental Status:  Normal mood and affect. Normal behavior. Normal judgment and thought content.  FBS: 90-108 PPBS: 99-240 Assessment and Plan:  Pregnancy: G4P3003 at [redacted]w[redacted]d  1. Supervision of high risk pregnancy, antepartum Continue routine care, pt has anatomy scan at [facility] today.  2. Nausea and vomiting during pregnancy Rx for phenergan  3. Multiple sclerosis (HCC) Followed by  neurology  4. Rh negative, antepartum Routine rhogam treatment  5. Obesity affecting pregnancy, antepartum   6. Multigravida of advanced maternal age in second trimester   7. [redacted] weeks gestation of pregnancy   8. Gestational diabetes mellitus (GDM) in second trimester controlled on oral hypoglycemic drug Poor blood sugar control by diet.  Compliance may be an issue.  Pt started on Metformin 500 mg po BID.  Recheck blood sugars in 2 weeks.  Will get MFM consult and will set up fetal echo after 20 weeks.  Preterm labor symptoms and general obstetric precautions including but not limited to vaginal bleeding, contractions, leaking of fluid and fetal movement were reviewed in detail with the patient.  Please refer to After Visit Summary for other counseling recommendations.   Return in about 2 weeks (around 04/26/2021) for [facility], in person.   Mariel Aloe, MD Faculty Attending Center for [facility]

## 2021-04-12 NOTE — Patient Instructions (Signed)
Pepcid para reflux o heartburn

## 2021-04-19 ENCOUNTER — Other Ambulatory Visit: Payer: Self-pay | Admitting: *Deleted

## 2021-04-19 ENCOUNTER — Ambulatory Visit: Payer: No Typology Code available for payment source | Admitting: *Deleted

## 2021-04-19 ENCOUNTER — Other Ambulatory Visit: Payer: Self-pay | Admitting: Family Medicine

## 2021-04-19 ENCOUNTER — Ambulatory Visit (HOSPITAL_BASED_OUTPATIENT_CLINIC_OR_DEPARTMENT_OTHER): Payer: Self-pay

## 2021-04-19 ENCOUNTER — Other Ambulatory Visit: Payer: Self-pay

## 2021-04-19 ENCOUNTER — Ambulatory Visit: Payer: No Typology Code available for payment source | Attending: Family Medicine | Admitting: Obstetrics and Gynecology

## 2021-04-19 VITALS — BP 126/74 | HR 98

## 2021-04-19 DIAGNOSIS — O99212 Obesity complicating pregnancy, second trimester: Secondary | ICD-10-CM | POA: Insufficient documentation

## 2021-04-19 DIAGNOSIS — O09522 Supervision of elderly multigravida, second trimester: Secondary | ICD-10-CM

## 2021-04-19 DIAGNOSIS — G35 Multiple sclerosis: Secondary | ICD-10-CM | POA: Insufficient documentation

## 2021-04-19 DIAGNOSIS — E669 Obesity, unspecified: Secondary | ICD-10-CM

## 2021-04-19 DIAGNOSIS — Z3A19 19 weeks gestation of pregnancy: Secondary | ICD-10-CM

## 2021-04-19 DIAGNOSIS — O0990 Supervision of high risk pregnancy, unspecified, unspecified trimester: Secondary | ICD-10-CM

## 2021-04-19 DIAGNOSIS — O24419 Gestational diabetes mellitus in pregnancy, unspecified control: Secondary | ICD-10-CM | POA: Insufficient documentation

## 2021-04-19 DIAGNOSIS — Z3689 Encounter for other specified antenatal screening: Secondary | ICD-10-CM

## 2021-04-19 DIAGNOSIS — Z3A21 21 weeks gestation of pregnancy: Secondary | ICD-10-CM | POA: Insufficient documentation

## 2021-04-19 DIAGNOSIS — Z363 Encounter for antenatal screening for malformations: Secondary | ICD-10-CM

## 2021-04-19 DIAGNOSIS — O99352 Diseases of the nervous system complicating pregnancy, second trimester: Secondary | ICD-10-CM

## 2021-04-19 DIAGNOSIS — O358XX0 Maternal care for other (suspected) fetal abnormality and damage, not applicable or unspecified: Secondary | ICD-10-CM | POA: Insufficient documentation

## 2021-04-19 DIAGNOSIS — O24415 Gestational diabetes mellitus in pregnancy, controlled by oral hypoglycemic drugs: Secondary | ICD-10-CM

## 2021-04-19 NOTE — Progress Notes (Signed)
Maternal-Fetal Medicine   Name: [name] Li DOB: July 12, 1981 MRN: [id number] Referring Provider: Mariel Aloe, MD  I had the pleasure of seeing Ms. Enriquez-Castro today at the [facility]. She is G4 P3 at 19w 2d gestation and is here for fetal anatomy scan and consultation. I obtained history and counseled the patient with help of Spanish language interpreter present in the room. She has gestational diabetes.  Past medical history significant for multiple sclerosis diagnosed in 2018 and she is being followed by her neurologist.  She she was getting Tysabri infusions and on the advice of neurologist, infusions have been discontinued.  She is stable without any symptoms.  MRI performed in November 2021 showed marked improvement from the previous MRI (2020).  Her most recent hemoglobin A1c was 6.1% and previous Hb A1C levels were not consistent with type 2 diabetes.  Past surgical history: Nil of note. Medications: Prenatal vitamins, aspirin (81 mg), metformin 500 mg twice daily. Allergies: No known drug allergies. Social history: Denies tobacco or drug or alcohol use.  Her husband is Hispanic and he is in good health Family history: No history of venous thromboembolism in the family. GYN history: No history of abnormal Pap smears or cervical surgeries.  No history of breast disease. Obstetric history significant for 3 term vaginal deliveries.  Her pregnancies were uncomplicated.  Her first child (2007) weight 10 pounds at birth.  No history of shoulder dystocia or neurological injuries.  Subsequently, the other 2 children weighed 8 lbs+ at birth. Her most recent delivery (2017) was complicated by postpartum hemorrhage.  Patient received blood transfusion. All her children are in good health.  Prenatal course: MSAFP screening showed low risk for open neural tube defects.  She has not had screening for fetal aneuploidies.  Early screening showed she has gestational  diabetes.  Patient reports she checks her blood glucose 4 times daily.  Her fasting levels are below 100 mg/DL and postprandial levels are reportedly within normal range.  (Below 120 mg/dL).  She has not brought her logbook today.  Ultrasound We performed fetal anatomy scan.  Amniotic fluid is normal and good fetal activity seen.  Fetal biometry is consistent with her ultrasound performed at [facility] radiology (04/12/2021).  Unilateral choroid plexus cyst is seen.  No other markers of aneuploidies or fetal structural defects are seen.  Cardiac anatomy appears normal.  Our concerns include Gestational diabetes -Discussed diagnosis of gestational diabetes, normal blood glucose parameters, frequency of testing and pregnancy outcomes.  -Poorly controlled gestational diabetes can lead to fetal complications including macrosomia, shoulder dystocia and birth injuries (neurological).  Neonatal complications are increased including NICU admissions.  Very poorly controlled diabetes can also lead to stillbirth.  -Based on a hemoglobin A1c measurement, she does not have type 2 diabetes.  I did not recommend fetal echocardiography.  -I explained ultrasound protocol in pregnancy is complicated by gestational diabetes. -Timing of delivery will be based on blood glucose control.  If diabetes is well controlled, she can be delivered at 39 weeks' gestation.  If diabetes is not well controlled, early term delivery (37 or [redacted] weeks gestation) is reasonable.  -Postpartum screening for type 2 diabetes is recommended at 6 to 12 weeks after delivery.  Multiple sclerosis in pregnancy - Pregnancy seems to confer some protection in reducing relapse rates (PRIMS study) and there is a significant reduction in relapse in the third trimester. Our patient reports good improvement and does not have symptoms/ About 25% of women have postpartum  relapses are more common. -Pregnancy does NOT influence long-term course (disability  outcomes) of MS. Pregnancy also does not affect the type or severity of exacerbation.   -MS does not increase adverse pregnancy outcomes including preterm delivery, preeclampsia, or fetal growth restriction. Patient does not have bladder symptoms that are likely to be exacerbated in pregnancy in some women.  -Pregnancy is not considered high risk if MS is stable and if she is not taking disease-modifying drugs.  -Vitamin D supplements are recommended.  -MRI in pregnancy is not contraindicated. Gadolinium dye is avoided but may be given if MRI is necessary for diagnosis.  -Prenatal and obstetric management is not influenced by MS and routine care should continue. Vaginal delivery is the goal and cesarean section should be performed only for obstetric indications.  -Epidural analgesia may be safely given. Anesthesiology consultation may be obtained in the third trimester.  Treatment of MS -Disease-modifying drugs are the mainstay of treatment of MS. Copaxone (glatiramer acetate or GA) has been approved for use in pregnant women (category B) and no increased adverse effects are reported. Other drugs include Interferon-beta (IFN-B) and natalizumab (Tysabri).  -In general, pregnant women should not be deprived of treatment because of pregnancy concerns. Falling relapse rates in pregnancy does not mean that long-term progression is in pause.  -Natalizumab (Tysabri) is considered in patients with severe MS. If considered for our patient, it can be continued in pregnancy and preferably stopped at 34 weeks for about 8 weeks (till after delivery). Self-limiting hematological abnormalities are reported in some newborns. Methylprednisone can be safely given in pregnancy if severe exacerbation requires steroid treatment.  I encouraged the patient to continue follow-up with her neurologist to discuss MS and treatment as needed. Although she will experience remission during pregnancy, treatment should NOT be  deferred because of her pregnancy.  Advanced maternal age I counseled the patient that advanced maternal age is associated with increased risk for fetal chromosomal anomalies including Down syndrome.  I discussed the significance and limitations of cell free fetal DNA screening.  I informed her that only amniocentesis will give a definitive result on fetal karyotype. Patient will discuss with her husband and decide on cell free fetal DNA screening.  Choroid plexus cyst (CPC) I counseled the patient that isolated CPC is only rarely associated with chromosomal anomaly (trisomy 18). I also reassured her that [facility] is not associated with structural malformations in the brain. CPCs usually resolve with advancing gestation.  His cell free fetal DNA screening showed low risk for trisomy 18, this finding should be considered a normal variant  Estimated due date Patient had ultrasound performed at [facility] radiology on 04/12/2021 and fetal biometry was consistent with 20 weeks and 6 days consistent with an EDD of 08/24/2021.  Patient is very sure she conceived her on March 3rd (date of intercourse), which places her approximate LMP around third week of February (consistent with EDD of 08/24/2021).  By her LMP, her EDD should be 12/70/2022 with gestational age [redacted] weeks and 2 days. Patient had large babies at birth in her previous pregnancies.  She is 5 feet 6 inches tall, and her husband is 6 feet 4 inches tall.  I explained because of her very certain date of intercourse, we will assign her EDD at 08/24/2021 even though LMP established date is not out of range given her giving birth to large babies at birth. We have assigned her EDD at 08/24/2021.  History of postpartum hemorrhage and blood transfusion I discussed  the importance of prevention of anemia.  Iron supplements may be needed as indicated.  Recommendations: -An appointment was made for her to return in 4 weeks of completion of fetal  anatomy. -Fetal growth assessment every 4 weeks till delivery. -Weekly BPP from 32 weeks' gestation till delivery. -Cell free fetal DNA screening if the patient opts for screening. -Continue care with neurologist. -Diabetes management. -Check vitamin D levels  Thank you for consultation.  If you have any questions or concerns, please contact me the [facility].  Consultation including face-to-face (more than 50%) counseling 50 minutes.

## 2021-04-20 ENCOUNTER — Other Ambulatory Visit: Payer: Self-pay

## 2021-04-20 ENCOUNTER — Ambulatory Visit: Payer: No Typology Code available for payment source

## 2021-04-26 ENCOUNTER — Other Ambulatory Visit: Payer: Self-pay

## 2021-04-26 ENCOUNTER — Encounter: Payer: Self-pay | Admitting: Obstetrics and Gynecology

## 2021-04-26 ENCOUNTER — Ambulatory Visit (INDEPENDENT_AMBULATORY_CARE_PROVIDER_SITE_OTHER): Payer: No Typology Code available for payment source | Admitting: Obstetrics and Gynecology

## 2021-04-26 VITALS — BP 124/69 | HR 99 | Wt 247.0 lb

## 2021-04-26 DIAGNOSIS — Z3A22 22 weeks gestation of pregnancy: Secondary | ICD-10-CM

## 2021-04-26 DIAGNOSIS — Z6791 Unspecified blood type, Rh negative: Secondary | ICD-10-CM

## 2021-04-26 DIAGNOSIS — O219 Vomiting of pregnancy, unspecified: Secondary | ICD-10-CM

## 2021-04-26 DIAGNOSIS — O09522 Supervision of elderly multigravida, second trimester: Secondary | ICD-10-CM

## 2021-04-26 DIAGNOSIS — Z3009 Encounter for other general counseling and advice on contraception: Secondary | ICD-10-CM

## 2021-04-26 DIAGNOSIS — O0990 Supervision of high risk pregnancy, unspecified, unspecified trimester: Secondary | ICD-10-CM

## 2021-04-26 DIAGNOSIS — G35 Multiple sclerosis: Secondary | ICD-10-CM

## 2021-04-26 DIAGNOSIS — O26899 Other specified pregnancy related conditions, unspecified trimester: Secondary | ICD-10-CM

## 2021-04-26 DIAGNOSIS — O24415 Gestational diabetes mellitus in pregnancy, controlled by oral hypoglycemic drugs: Secondary | ICD-10-CM

## 2021-04-26 DIAGNOSIS — Z789 Other specified health status: Secondary | ICD-10-CM | POA: Insufficient documentation

## 2021-04-26 DIAGNOSIS — Z6841 Body Mass Index (BMI) 40.0 and over, adult: Secondary | ICD-10-CM

## 2021-04-26 MED ORDER — PROMETHAZINE HCL 25 MG PO TABS: 25.0000 mg | ORAL_TABLET | Freq: Four times a day (QID) | ORAL | 1 refills | Status: DC | PRN

## 2021-04-26 NOTE — Progress Notes (Signed)
   PRENATAL VISIT NOTE  Subjective:  [name] Li is a 40 y.o. G4P3003 at [redacted]w[redacted]d being seen today for ongoing prenatal care.  She is currently monitored for the following issues for this high-risk pregnancy and has Rh negative, antepartum; Obesity affecting pregnancy, antepartum; Multiple sclerosis (HCC); Numbness; Ataxic gait; High risk medication use; Supervision of high risk pregnancy, antepartum; Advanced maternal age in multigravida; Gestational diabetes; [redacted] weeks gestation of pregnancy; Nausea and vomiting during pregnancy; BMI 40.0-44.9, adult (HCC); Unwanted fertility; and Language barrier on their problem list.  Patient reports no complaints.  Contractions: Not present. Vag. Bleeding: None.  Movement: Present. Denies leaking of fluid.   The following portions of the patient's history were reviewed and updated as appropriate: allergies, current medications, past family history, past medical history, past social history, past surgical history and problem list.   Objective:   Vitals:   04/26/21 1316  BP: 124/69  Pulse: 99  Weight: 247 lb (112 kg)    Fetal Status: Fetal Heart Rate (bpm): 155   Movement: Present     General:  Alert, oriented and cooperative. Patient is in no acute distress.  Skin: Skin is warm and dry. No rash noted.   Cardiovascular: Normal heart rate noted  Respiratory: Normal respiratory effort, no problems with respiration noted  Abdomen: Soft, gravid, appropriate for gestational age.  Pain/Pressure: Present     Pelvic: Cervical exam deferred        Extremities: Normal range of motion.  Edema: None  Mental Status: Normal mood and affect. Normal behavior. Normal judgment and thought content.   Assessment and Plan:  Pregnancy: G4P3003 at [redacted]w[redacted]d  1. Gestational diabetes mellitus (GDM) in second trimester controlled on oral hypoglycemic drug On metformin 500 mg BID CBGs mostly within range Cont current meds  2. Supervision of high risk pregnancy,  antepartum  3. Rh negative, antepartum Rho gam @ 28 weeks  4. BMI 40.0-44.9, adult (HCC)  5. Multigravida of advanced maternal age in second trimester  6. Multiple sclerosis (HCC) D/c meds at beginning of pregnancy Followed by neurology, has not had any worsening of symptoms and so opted not to take meds offered by neurology - states she is not feeling any symptoms except when she gets very stressed, manages this by trying to stay calm and manage stress  7. [redacted] weeks gestation of pregnancy  8. Unwanted fertility Adopt a mom-BTL not covered  9. Language barrier Engineer, structural used  Preterm labor symptoms and general obstetric precautions including but not limited to vaginal bleeding, contractions, leaking of fluid and fetal movement were reviewed in detail with the patient. Please refer to After Visit Summary for other counseling recommendations.   Return in about 2 weeks (around 05/10/2021) for [facility] OB, in person.  Future Appointments  Date Time Provider Department Center  05/18/2021  9:15 AM [facility] NURSE [facility]  05/18/2021  9:30 AM [facility] US3 [facility]  06/30/2021 11:00 AM Sater, Pearletha Furl, MD [facility] None    Conan Bowens, MD

## 2021-04-27 LAB — VITAMIN D 25 HYDROXY (VIT D DEFICIENCY, FRACTURES): Vit D, 25-Hydroxy: 25.5 ng/mL — ABNORMAL LOW (ref 30.0–100.0)

## 2021-04-29 ENCOUNTER — Telehealth: Payer: Self-pay

## 2021-04-29 NOTE — Telephone Encounter (Signed)
-----   Message from Conan Bowens, MD sent at 04/29/2021  1:23 PM EDT ----- Please let patient know Vit D is low and she should start supplementation 1000 IU per day. Thanks.

## 2021-04-29 NOTE — Telephone Encounter (Signed)
Call placed to pt. Spoke with pt. Pt given results and recommendations per Dr Earlene Plater. Pt confirmed supplement, but pt's call was disconnected. Tried to return call to pt, no answer.   Judeth Cornfield, RN

## 2021-04-29 NOTE — Telephone Encounter (Signed)
Call placed to pt for results. No answer on all numbers given and unable to leave VM. Pt does not have active Mychart.  Will try again to call pt with results.   Judeth Cornfield, RN

## 2021-05-07 ENCOUNTER — Inpatient Hospital Stay (HOSPITAL_COMMUNITY)
Admission: AD | Admit: 2021-05-07 | Discharge: 2021-05-07 | Disposition: A | Discharge status: Home / Self Care | Payer: No Typology Code available for payment source | Attending: Obstetrics & Gynecology | Admitting: Obstetrics & Gynecology

## 2021-05-07 ENCOUNTER — Encounter (HOSPITAL_COMMUNITY): Payer: Self-pay | Admitting: Obstetrics & Gynecology

## 2021-05-07 ENCOUNTER — Inpatient Hospital Stay (HOSPITAL_COMMUNITY): Payer: No Typology Code available for payment source

## 2021-05-07 ENCOUNTER — Other Ambulatory Visit: Payer: Self-pay

## 2021-05-07 DIAGNOSIS — O26612 Liver and biliary tract disorders in pregnancy, second trimester: Secondary | ICD-10-CM | POA: Insufficient documentation

## 2021-05-07 DIAGNOSIS — K8020 Calculus of gallbladder without cholecystitis without obstruction: Secondary | ICD-10-CM

## 2021-05-07 DIAGNOSIS — Z7984 Long term (current) use of oral hypoglycemic drugs: Secondary | ICD-10-CM | POA: Insufficient documentation

## 2021-05-07 DIAGNOSIS — Z7982 Long term (current) use of aspirin: Secondary | ICD-10-CM | POA: Insufficient documentation

## 2021-05-07 DIAGNOSIS — O99891 Other specified diseases and conditions complicating pregnancy: Secondary | ICD-10-CM

## 2021-05-07 DIAGNOSIS — Z3A00 Weeks of gestation of pregnancy not specified: Secondary | ICD-10-CM

## 2021-05-07 DIAGNOSIS — R1010 Upper abdominal pain, unspecified: Secondary | ICD-10-CM | POA: Insufficient documentation

## 2021-05-07 DIAGNOSIS — O09522 Supervision of elderly multigravida, second trimester: Secondary | ICD-10-CM

## 2021-05-07 DIAGNOSIS — G35 Multiple sclerosis: Secondary | ICD-10-CM | POA: Insufficient documentation

## 2021-05-07 DIAGNOSIS — O99352 Diseases of the nervous system complicating pregnancy, second trimester: Secondary | ICD-10-CM | POA: Insufficient documentation

## 2021-05-07 DIAGNOSIS — O0990 Supervision of high risk pregnancy, unspecified, unspecified trimester: Secondary | ICD-10-CM

## 2021-05-07 DIAGNOSIS — Z3A24 24 weeks gestation of pregnancy: Secondary | ICD-10-CM | POA: Insufficient documentation

## 2021-05-07 DIAGNOSIS — R109 Unspecified abdominal pain: Secondary | ICD-10-CM

## 2021-05-07 LAB — COMPREHENSIVE METABOLIC PANEL
ALT: 15 U/L (ref 0–44)
AST: 15 U/L (ref 15–41)
Albumin: 3.4 g/dL — ABNORMAL LOW (ref 3.5–5.0)
Alkaline Phosphatase: 57 U/L (ref 38–126)
Anion gap: 10 (ref 5–15)
BUN: 5 mg/dL — ABNORMAL LOW (ref 6–20)
CO2: 19 mmol/L — ABNORMAL LOW (ref 22–32)
Calcium: 9.2 mg/dL (ref 8.9–10.3)
Chloride: 102 mmol/L (ref 98–111)
Creatinine, Ser: 0.53 mg/dL (ref 0.44–1.00)
GFR, Estimated: >60 mL/min (ref >60)
Glucose, Bld: 91 mg/dL (ref 70–99)
Potassium: 3.5 mmol/L (ref 3.5–5.1)
Sodium: 131 mmol/L — ABNORMAL LOW (ref 135–145)
Total Bilirubin: 0.6 mg/dL (ref 0.3–1.2)
Total Protein: 7 g/dL (ref 6.5–8.1)

## 2021-05-07 LAB — URINALYSIS, MICROSCOPIC (REFLEX)

## 2021-05-07 LAB — URINALYSIS, ROUTINE W REFLEX MICROSCOPIC
Glucose, UA: NEGATIVE mg/dL
Hgb urine dipstick: NEGATIVE
Ketones, ur: >80 mg/dL — AB
Nitrite: NEGATIVE
Protein, ur: 30 mg/dL — AB
Specific Gravity, Urine: >1.03 — ABNORMAL HIGH (ref 1.005–1.030)
pH: 6 (ref 5.0–8.0)

## 2021-05-07 LAB — LIPASE, BLOOD: Lipase: 28 U/L (ref 11–51)

## 2021-05-07 LAB — CBC
HCT: 38.2 % (ref 36.0–46.0)
Hemoglobin: 12.3 g/dL (ref 12.0–15.0)
MCH: 26.7 pg (ref 26.0–34.0)
MCHC: 32.2 g/dL (ref 30.0–36.0)
MCV: 82.9 fL (ref 80.0–100.0)
Platelets: 180 10*3/uL (ref 150–400)
RBC: 4.61 MIL/uL (ref 3.87–5.11)
RDW: 14.5 % (ref 11.5–15.5)
WBC: 9.7 10*3/uL (ref 4.0–10.5)
nRBC: 0 % (ref 0.0–0.2)

## 2021-05-07 MED ORDER — LIDOCAINE VISCOUS HCL 2 % MT SOLN
15.0000 mL | Freq: Once | OROMUCOSAL | Status: AC
  Administered 2021-05-07: 15 mL via ORAL
  Filled 2021-05-07: qty 15

## 2021-05-07 MED ORDER — ALUM & MAG HYDROXIDE-SIMETH 200-200-20 MG/5ML PO SUSP
30.0000 mL | Freq: Once | ORAL | Status: AC
  Administered 2021-05-07: 30 mL via ORAL
  Filled 2021-05-07: qty 30

## 2021-05-07 MED ORDER — LACTATED RINGERS IV BOLUS
1000.0000 mL | Freq: Once | INTRAVENOUS | Status: AC
  Administered 2021-05-07: 1000 mL via INTRAVENOUS

## 2021-05-07 MED ORDER — KETOROLAC TROMETHAMINE 30 MG/ML IJ SOLN
30.0000 mg | Freq: Once | INTRAMUSCULAR | Status: AC
  Administered 2021-05-07: 30 mg via INTRAVENOUS
  Filled 2021-05-07: qty 1

## 2021-05-07 NOTE — MAU Provider Note (Addendum)
History     CSN: [id number]  Arrival date and time: 05/07/21 1707   Event Date/Time   First Provider Initiated Contact with Patient 05/07/21 2030      Chief Complaint  Patient presents with   Abdominal Pain   HPI OB History     Gravida  4   Para  3   Term  3   Preterm  0   AB  0   Living  3      SAB  0   IAB  0   Ectopic  0   Multiple  0   Live Births  3           Past Medical History:  Diagnosis Date   Depression    GBS (group B Streptococcus carrier), +RV culture, currently pregnant 06/17/2016   Gestational diabetes    glyburide   Multiple sclerosis (HCC)    Supervision of high risk pregnancy, antepartum 01/02/2016    Clinic  High Risk>transfer from [facility] Prenatal Labs Dating LMP Blood type: O/Negative/-- (03/27 0000)  Genetic Screen 1 Screen: normal   AFP:      Antibody:Negative (03/27 0000) Anatomic [place] Bilateral choroid plexus cysts, all else wnl. [ ]  serial growth scans Rubella: Immune (03/27 0000) GTT Early:   213     RPR: Nonreactive (03/27 0000)  Flu vaccine  declined HBsAg: Negative (03/27 0000)  TDaP v    Past Surgical History:  Procedure Laterality Date   NO PAST SURGERIES      Family History  Problem Relation Age of Onset   Mental illness Mother        depression and OCD - sees psychiatrist   Multiple births Mother        pregnant with twins but lost them   Hypertension Father    Diabetes Father    Heart disease Father    Diabetes Brother    Thyroid disease Brother     Social History   Tobacco Use   Smoking status: Never   Smokeless tobacco: Never  Vaping Use   Vaping Use: Never used  Substance Use Topics   Alcohol use: No   Drug use: No    Allergies: No Known Allergies  Medications Prior to Admission  Medication Sig Dispense Refill Last Dose   aspirin EC 81 MG tablet Take 1 tablet (81 mg total) by mouth daily. Take after 12 weeks for prevention of preeclampsia later in pregnancy 300 tablet 2 05/07/2021   metFORMIN  (GLUCOPHAGE) 500 MG tablet Take 1 tablet (500 mg total) by mouth 2 (two) times daily with a meal. 60 tablet 5 05/07/2021   Prenatal Vit-Fe Fumarate-FA (PRENATAL MULTIVITAMIN) TABS tablet Take 1 tablet by mouth daily at 12 noon.   05/07/2021   promethazine (PHENERGAN) 25 MG tablet Take 1 tablet (25 mg total) by mouth every 6 (six) hours as needed for nausea or vomiting. 30 tablet 1 05/07/2021    Review of Systems  Constitutional:  Negative for fever.  Gastrointestinal:  Positive for abdominal pain, nausea and vomiting. Negative for constipation and diarrhea.  Genitourinary:  Negative for vaginal bleeding.  Physical Exam   Blood pressure 122/71, pulse 90, temperature 98.2 F (36.8 C), temperature source Oral, resp. rate 18, weight 111 kg, last menstrual period 12/05/2020, SpO2 99 %, unknown if currently breastfeeding.  Physical Exam Vitals and nursing note reviewed.  Constitutional:      General: She is not in acute distress.    Appearance: Normal appearance.  HENT:     Head: Normocephalic and atraumatic.  Cardiovascular:     Rate and Rhythm: Normal rate.  Pulmonary:     Effort: Pulmonary effort is normal. No respiratory distress.  Abdominal:     General: There is no distension.     Palpations: Abdomen is soft.     Tenderness: There is no abdominal tenderness.     Comments: gravid  Musculoskeletal:        General: Normal range of motion.     Cervical back: Normal range of motion.  Skin:    General: Skin is warm and dry.  Neurological:     General: No focal deficit present.     Mental Status: She is alert and oriented to person, place, and time.  Psychiatric:        Mood and Affect: Mood normal.        Behavior: Behavior normal.  EFM: 155 bpm, mod variability, + accels, no decels Toco: none  Results for orders placed or performed during the hospital encounter of 05/07/21 (from the past 24 hour(s))  Urinalysis, Routine w reflex microscopic Urine, Clean Catch     Status: Abnormal    Collection Time: 05/07/21  6:22 PM  Result Value Ref Range   Color, Urine YELLOW YELLOW   APPearance CLEAR CLEAR   Specific Gravity, Urine >1.030 (H) 1.005 - 1.030   pH 6.0 5.0 - 8.0   Glucose, UA NEGATIVE NEGATIVE mg/dL   Hgb urine dipstick NEGATIVE NEGATIVE   Bilirubin Urine SMALL (A) NEGATIVE   Ketones, ur >80 (A) NEGATIVE mg/dL   Protein, ur 30 (A) NEGATIVE mg/dL   Nitrite NEGATIVE NEGATIVE   Leukocytes,Ua SMALL (A) NEGATIVE  Urinalysis, Microscopic (reflex)     Status: Abnormal   Collection Time: 05/07/21  6:22 PM  Result Value Ref Range   RBC / HPF 0-5 0 - 5 RBC/hpf   WBC, UA 6-10 0 - 5 WBC/hpf   Bacteria, UA FEW (A) NONE SEEN   Squamous Epithelial / LPF 0-5 0 - 5   Ca Oxalate Crys, UA PRESENT    No results found.  MAU Course  Procedures LR Toradol GI cocktail  MDM Labs and [place] ordered.  Transfer of care given to [facility], NP Donette Larry, CNM  05/07/2021 9:01 PM   Care transferred to Sabas Sous, CNM by N. Nugent, NP  Assessment and Plan  Reassessment (10:08 PM) -Results as above. -Nurse reports patient with resolution of symptoms. -Provider to bedside to discuss results.  -Patient given informational sheet and eating plan. -Q/C regarding diagnosis, management, and need for surgery addressed. -Instructed to follow up as scheduled. -Encouraged to call or return to MAU if symptoms worsen or with the onset of new symptoms. -Discharged to home in stable condition.  Cherre Robins MSN, CNM Advanced Practice Provider, Center for [employer]

## 2021-05-07 NOTE — MAU Note (Addendum)
Since yesterday morning , had pain started "in the mouth of the stomach and extends to the back". Waited for it to go away.  Went to primary dr, they couldn't recommend anything and told her to call her OB/GYN.  She called them and they sent her here. Right now she is feeling a burning, when the pain comes it is like a contractions.still throws up every day- only in the morning- takes the medication for nausea and it goes away. .denies nausea at this time. Has burning in throat/ upper chest. Usually has the burning when she throws up, or when she eats something spicy.  Hasn't eaten today.  Had diarrhea at 0230, only the one time.

## 2021-05-09 LAB — CULTURE, OB URINE

## 2021-05-10 ENCOUNTER — Other Ambulatory Visit: Payer: Self-pay | Admitting: Family Medicine

## 2021-05-11 ENCOUNTER — Other Ambulatory Visit: Payer: Self-pay

## 2021-05-11 ENCOUNTER — Encounter: Payer: Self-pay | Admitting: Family Medicine

## 2021-05-11 ENCOUNTER — Ambulatory Visit (INDEPENDENT_AMBULATORY_CARE_PROVIDER_SITE_OTHER): Payer: Self-pay | Admitting: Family Medicine

## 2021-05-11 VITALS — BP 109/75 | HR 102 | Wt 244.0 lb

## 2021-05-11 DIAGNOSIS — O24415 Gestational diabetes mellitus in pregnancy, controlled by oral hypoglycemic drugs: Secondary | ICD-10-CM

## 2021-05-11 DIAGNOSIS — O09522 Supervision of elderly multigravida, second trimester: Secondary | ICD-10-CM

## 2021-05-11 DIAGNOSIS — Z6791 Unspecified blood type, Rh negative: Secondary | ICD-10-CM

## 2021-05-11 DIAGNOSIS — G35 Multiple sclerosis: Secondary | ICD-10-CM

## 2021-05-11 DIAGNOSIS — Z3009 Encounter for other general counseling and advice on contraception: Secondary | ICD-10-CM

## 2021-05-11 DIAGNOSIS — O26899 Other specified pregnancy related conditions, unspecified trimester: Secondary | ICD-10-CM

## 2021-05-11 DIAGNOSIS — Z789 Other specified health status: Secondary | ICD-10-CM

## 2021-05-11 DIAGNOSIS — O0990 Supervision of high risk pregnancy, unspecified, unspecified trimester: Secondary | ICD-10-CM

## 2021-05-11 MED ORDER — METFORMIN HCL 1000 MG PO TABS
1000.0000 mg | ORAL_TABLET | Freq: Two times a day (BID) | ORAL | 5 refills | Status: DC
Start: 2021-05-11 — End: 2021-06-21

## 2021-05-11 NOTE — Progress Notes (Signed)
   Subjective:  [name] Li is a 40 y.o. G4P3003 at [redacted]w[redacted]d being seen today for ongoing prenatal care.  She is currently monitored for the following issues for this high-risk pregnancy and has Rh negative, antepartum; Obesity affecting pregnancy, antepartum; Multiple sclerosis (HCC); Numbness; Ataxic gait; High risk medication use; Supervision of high risk pregnancy, antepartum; Advanced maternal age in multigravida; Gestational diabetes; [redacted] weeks gestation of pregnancy; Nausea and vomiting during pregnancy; BMI 40.0-44.9, adult (HCC); Unwanted fertility; and Language barrier on their problem list.  Patient reports no complaints.  Contractions: Not present. Vag. Bleeding: None.  Movement: Present. Denies leaking of fluid.   The following portions of the patient's history were reviewed and updated as appropriate: allergies, current medications, past family history, past medical history, past social history, past surgical history and problem list. Problem list updated.  Objective:   Vitals:   05/11/21 1106  BP: 109/75  Pulse: (!) 102  Weight: 244 lb (110.7 kg)    Fetal Status: Fetal Heart Rate (bpm): 158   Movement: Present     General:  Alert, oriented and cooperative. Patient is in no acute distress.  Skin: Skin is warm and dry. No rash noted.   Cardiovascular: Normal heart rate noted  Respiratory: Normal respiratory effort, no problems with respiration noted  Abdomen: Soft, gravid, appropriate for gestational age. Pain/Pressure: Present     Pelvic: Vag. Bleeding: None     Cervical exam deferred        Extremities: Normal range of motion.  Edema: None  Mental Status: Normal mood and affect. Normal behavior. Normal judgment and thought content.   Urinalysis:      Assessment and Plan:  Pregnancy: G4P3003 at [redacted]w[redacted]d  1. Supervision of high risk pregnancy, antepartum BP and FHR normal  2. Gestational diabetes mellitus (GDM) in second trimester controlled on oral hypoglycemic  drug Sugars only 47% at goal Will increase metformin to 1000mg  BID, discussed she will need insulin if sugars do not improve  3. Multiple sclerosis (HCC) Has Neurologist, last saw 02/01/21 Per last note was taken off Tysabri and was going to try alternative that is safer in pregnancy Per last phone note they have been unable to reach patient to complete application for charity care for med Patient reports per her conversation with Neurologist she was given option of either pursuing this treatment or waiting to resume Tysabri after delivery She decided to wait since she has been feeling well  4. Rh negative, antepartum Rhogam next visit  5. Multigravida of advanced maternal age in second trimester   6. Unwanted fertility Desires BTL however adopt a mom Discussed options, thinking about IUD  7. Language barrier Spanish  Preterm labor symptoms and general obstetric precautions including but not limited to vaginal bleeding, contractions, leaking of fluid and fetal movement were reviewed in detail with the patient. Please refer to After Visit Summary for other counseling recommendations.  Return in 2 weeks (on 05/25/2021) for [facility], ob visit, needs MD.   [facility], MD

## 2021-05-11 NOTE — Patient Instructions (Signed)
Eleccin del mtodo anticonceptivo Contraception Choices La anticoncepcin, o los mtodos anticonceptivos, hace referencia a los mtodos o dispositivos que evitan el embarazo. Mtodos hormonales Implante anticonceptivo Un implante anticonceptivo consiste en un tubo delgado de plstico que contiene una hormona que evita el embarazo. Es diferente de un dispositivo intrauterino (DIU). Un mdico lo inserta en la parte superior del brazo. Los implantes pueden ser eficaces durante un mximo de 3 aos. Inyecciones de progestina sola Las inyecciones de progestina sola contienen progestina, una forma sinttica de la hormona progesterona. Un mdico las administra cada 3 meses. Pldoras anticonceptivas Las pldoras anticonceptivas son pastillas que contienen hormonas que evitan el embarazo. Deben tomarse una vez al da, preferentemente a la misma hora cada da. Se necesita una receta para utilizar este mtodo anticonceptivo. Parche anticonceptivo El parche anticonceptivo contiene hormonas que evitan el embarazo. Se coloca en la piel, debe cambiarse una vez a la semana durante tres semanas y debe retirarse en la cuarta semana. Se necesita una receta para utilizar este mtodo anticonceptivo. Anillo vaginal Un anillo vaginal contiene hormonas que evitan el embarazo. Se coloca en la vagina durante tres semanas y se retira en la cuarta semana. Luego se repite el proceso con un anillo nuevo. Se necesita una receta para utilizar este mtodo anticonceptivo. Anticonceptivo de emergencia Los anticonceptivos de emergencia son mtodos para evitar un embarazo despus de tener sexo sin proteccin. Vienen en forma de pldora y pueden tomarse hasta 5 das despus de tener sexo. Funcionan mejor cuando se toman lo ms pronto posible luego de tener sexo. La mayora de los anticonceptivos de emergencia estn disponibles sin receta mdica. Este mtodo no debe utilizarse como el nico mtodo anticonceptivo. Mtodos de  barrera Condn masculino Un condn masculino es una vaina delgada que se coloca sobre el pene durante el sexo. Los condones evitan que el esperma ingrese en el cuerpo de la mujer. Pueden utilizarse con un una sustancia que mata a los espermatozoides (espermicida) para aumentar la efectividad. Deben desecharse despus de un uso. Condn femenino Un condn femenino es una vaina blanda y holgada que se coloca en la vagina antes de tener sexo. El condn evita que el esperma ingrese en el cuerpo de la mujer. Deben desecharse despus de un uso. Diafragma Un diafragma es una barrera blanda con forma de cpula. Se inserta en la vagina antes del sexo, junto con un espermicida. El diafragma bloquea el ingreso de esperma en el tero, y el espermicida mata a los espermatozoides. El diafragma debe permanecer en la vagina durante 6 a 8 horas despus de tener sexo y debe retirarse en el plazo de las 24 horas. Un diafragma es recetado y colocado por un mdico. Debe reemplazarse cada 1 a 2 aos, despus de dar a luz, de aumentar ms de 15lb (6.8kg) y de una ciruga plvica. Capuchn cervical Un capuchn cervical es una copa redonda y blanda de ltex o plstico que se coloca en el cuello uterino. Se inserta en la vagina antes del sexo, junto con un espermicida. Bloquea el ingreso del esperma en el tero. El capuchn debe permanecer en el lugar durante 6 a 8 horas despus de tener sexo y debe retirarse en el plazo de las 48 horas. Un capuchn cervical debe ser recetado y colocado por un mdico. Debe reemplazarse cada 2aos. Esponja Una esponja es una pieza blanda y circular de espuma de poliuretano que contiene espermicida. La esponja ayuda a bloquear el ingreso de esperma en el tero, y el espermicida mata a   los espermatozoides. Para utilizarla, debe humedecerla e insertarla en la vagina. Debe insertarse antes de tener sexo, debe permanecer dentro al menos durante 6 horas despus de tener sexo y debe retirarse y  desecharse en el plazo de las 30 horas. Espermicidas Los espermicidas son sustancias qumicas que matan o bloquean al esperma y no lo dejan ingresar al cuello uterino y al tero. Vienen en forma de crema, gel, supositorio, espuma o comprimido. Un espermicida debe insertarse en la vagina con un aplicador al menos 10 o 15 minutos antes de tener sexo para dar tiempo a que surta efecto. El proceso debe repetirse cada vez que tenga sexo. Los espermicidas no requieren receta mdica. Anticonceptivos intrauterinos Dispositivo intrauterino (DIU) Un DIU es un dispositivo en forma de T que se coloca en el tero. Existen dos tipos: DIU hormonal.Este tipo contiene progestina, una forma sinttica de la hormona progesterona. Este tipo puede permanecer colocado durante 3 a 5 aos. DIU de cobre.Este tipo est recubierto con un alambre de cobre. Puede permanecer colocado durante 10 aos. Mtodos anticonceptivos permanentes Ligadura de trompas en la mujer En este mtodo, se sellan, atan u obstruyen las trompas de Falopio durante una ciruga para evitar que el vulo descienda hacia el tero. Esterilizacin histeroscpica En este mtodo, se coloca un implante pequeo y flexible dentro de cada trompa de Falopio. Los implantes hacen que se forme un tejido cicatricial en las trompas de Falopio y que las obstruya para que el espermatozoide no pueda llegar al vulo. El procedimiento demora alrededor de 3 meses para que sea efectivo. Debe utilizarse otro mtodo anticonceptivo durante esos 3 meses. Esterilizacin masculina Este es un procedimiento que consiste en atar los conductos que transportan el esperma (vasectoma). Luego del procedimiento, el hombre puede eyacular lquido (semen). Debe utilizarse otro mtodo anticonceptivo durante 3 meses despus del procedimiento. Mtodos de planificacin natural Planificacin familiar natural En este mtodo, la pareja no tiene sexo durante los das en que la mujer podra quedar  embarazada. Mtodo calendario En este mtodo, la mujer realiza un seguimiento de la duracin de cada ciclo menstrual, identifica los das en los que se puede producir un embarazo y no tiene sexo durante esos das. Mtodo de la ovulacin En este mtodo, la pareja evita tener sexo durante la ovulacin. Mtodo sintotrmico Este mtodo implica no tener sexo durante la ovulacin. Normalmente, la mujer comprueba la ovulacin al observar cambios en su temperatura y en la consistencia del moco cervical. Mtodo posovulacin En este mtodo, la pareja espera a que finalice la ovulacin para tener sexo. Dnde buscar ms informacin Centers for Disease Control and Prevention (Centros para el Control y la Prevencin de Enfermedades): www.cdc.gov Resumen La anticoncepcin, o los mtodos anticonceptivos, hace referencia a los mtodos o dispositivos que evitan el embarazo. Los mtodos anticonceptivos hormonales incluyen implantes, inyecciones, pastillas, parches, anillos vaginales y anticonceptivos de emergencia. Los mtodos anticonceptivos de barrera pueden incluir condones masculinos, condones femeninos, diafragmas, capuchones cervicales, esponjas y espermicidas. Existen dos tipos de DIU (dispositivo intrauterino). Un DIU puede colocarse en el tero de una mujer para evitar el embarazo durante 3 a 5 aos. La esterilizacin permanente puede realizarse mediante un procedimiento tanto en los hombres como en las mujeres. Los mtodos de planificacin familiar natural implican no tener sexo durante los das en que la mujer podra quedar embarazada. Esta informacin no tiene como fin reemplazar el consejo del mdico. Asegrese de hacerle al mdico cualquier pregunta que tenga. Document Revised: 04/14/2020 Document Reviewed: 04/14/2020 Elsevier Patient Education  2022 Elsevier   Inc.  

## 2021-05-18 ENCOUNTER — Encounter: Payer: Self-pay | Admitting: *Deleted

## 2021-05-18 ENCOUNTER — Ambulatory Visit: Payer: No Typology Code available for payment source | Admitting: *Deleted

## 2021-05-18 ENCOUNTER — Other Ambulatory Visit: Payer: Self-pay | Admitting: Obstetrics and Gynecology

## 2021-05-18 ENCOUNTER — Other Ambulatory Visit: Payer: Self-pay

## 2021-05-18 ENCOUNTER — Ambulatory Visit: Payer: No Typology Code available for payment source | Attending: Obstetrics and Gynecology

## 2021-05-18 VITALS — BP 123/66 | HR 90

## 2021-05-18 DIAGNOSIS — O99352 Diseases of the nervous system complicating pregnancy, second trimester: Secondary | ICD-10-CM

## 2021-05-18 DIAGNOSIS — O0990 Supervision of high risk pregnancy, unspecified, unspecified trimester: Secondary | ICD-10-CM

## 2021-05-18 DIAGNOSIS — O99212 Obesity complicating pregnancy, second trimester: Secondary | ICD-10-CM

## 2021-05-18 DIAGNOSIS — O09522 Supervision of elderly multigravida, second trimester: Secondary | ICD-10-CM

## 2021-05-18 DIAGNOSIS — O09512 Supervision of elderly primigravida, second trimester: Secondary | ICD-10-CM

## 2021-05-18 DIAGNOSIS — E669 Obesity, unspecified: Secondary | ICD-10-CM

## 2021-05-18 DIAGNOSIS — O24415 Gestational diabetes mellitus in pregnancy, controlled by oral hypoglycemic drugs: Secondary | ICD-10-CM

## 2021-05-18 DIAGNOSIS — G35 Multiple sclerosis: Secondary | ICD-10-CM

## 2021-05-18 DIAGNOSIS — Z363 Encounter for antenatal screening for malformations: Secondary | ICD-10-CM

## 2021-05-18 DIAGNOSIS — O321XX0 Maternal care for breech presentation, not applicable or unspecified: Secondary | ICD-10-CM

## 2021-05-18 DIAGNOSIS — Z3A26 26 weeks gestation of pregnancy: Secondary | ICD-10-CM

## 2021-05-18 DIAGNOSIS — O358XX0 Maternal care for other (suspected) fetal abnormality and damage, not applicable or unspecified: Secondary | ICD-10-CM

## 2021-05-20 ENCOUNTER — Telehealth: Payer: Self-pay | Admitting: *Deleted

## 2021-05-20 NOTE — Telephone Encounter (Signed)
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 And may be called at [phone]. Ercel Pepitone,RN

## 2021-05-21 NOTE — Telephone Encounter (Signed)
I called Lallie with [employer], Interpreter and patient explained she is calling about getting her tdap. States was told to get at [facility] department since she is Adopt-A-Mom. I explained that is not what we usually do, and we usually give it 26-28 weeks. I explained at her visit next week on 05/26/21 she can discuss with provider because she will be 27 weeks and if that is a new process we will explain what to do. She voices understanding. Jefte Carithers,RN

## 2021-05-26 ENCOUNTER — Ambulatory Visit (INDEPENDENT_AMBULATORY_CARE_PROVIDER_SITE_OTHER): Payer: Self-pay | Admitting: Obstetrics and Gynecology

## 2021-05-26 ENCOUNTER — Other Ambulatory Visit: Payer: Self-pay

## 2021-05-26 VITALS — BP 120/81 | HR 106 | Wt 245.6 lb

## 2021-05-26 DIAGNOSIS — G35 Multiple sclerosis: Secondary | ICD-10-CM | POA: Insufficient documentation

## 2021-05-26 DIAGNOSIS — O99353 Diseases of the nervous system complicating pregnancy, third trimester: Secondary | ICD-10-CM

## 2021-05-26 DIAGNOSIS — Z6791 Unspecified blood type, Rh negative: Secondary | ICD-10-CM

## 2021-05-26 DIAGNOSIS — O26899 Other specified pregnancy related conditions, unspecified trimester: Secondary | ICD-10-CM

## 2021-05-26 DIAGNOSIS — O99210 Obesity complicating pregnancy, unspecified trimester: Secondary | ICD-10-CM

## 2021-05-26 DIAGNOSIS — Z5941 Food insecurity: Secondary | ICD-10-CM

## 2021-05-26 DIAGNOSIS — O0990 Supervision of high risk pregnancy, unspecified, unspecified trimester: Secondary | ICD-10-CM

## 2021-05-26 DIAGNOSIS — O09523 Supervision of elderly multigravida, third trimester: Secondary | ICD-10-CM

## 2021-05-26 DIAGNOSIS — Z6841 Body Mass Index (BMI) 40.0 and over, adult: Secondary | ICD-10-CM

## 2021-05-26 DIAGNOSIS — Z3009 Encounter for other general counseling and advice on contraception: Secondary | ICD-10-CM

## 2021-05-26 DIAGNOSIS — O24415 Gestational diabetes mellitus in pregnancy, controlled by oral hypoglycemic drugs: Secondary | ICD-10-CM

## 2021-05-26 DIAGNOSIS — Z789 Other specified health status: Secondary | ICD-10-CM

## 2021-05-26 MED ORDER — RHO D IMMUNE GLOBULIN 1500 UNIT/2ML IJ SOSY
300.0000 ug | PREFILLED_SYRINGE | Freq: Once | INTRAMUSCULAR | Status: AC
  Administered 2021-05-26: 300 ug via INTRAMUSCULAR

## 2021-05-26 NOTE — Progress Notes (Signed)
PRENATAL VISIT NOTE  Subjective:  [name] Li is a 40 y.o. G4P3003 at [redacted]w[redacted]d being seen today for ongoing prenatal care.  She is currently monitored for the following issues for this high-risk pregnancy and has Rh negative, antepartum; Obesity affecting pregnancy, antepartum; Multiple sclerosis (HCC); Numbness; Ataxic gait; High risk medication use; Supervision of high risk pregnancy, antepartum; Advanced maternal age in multigravida; Gestational diabetes; Nausea and vomiting during pregnancy; BMI 40.0-44.9, adult (HCC); Unwanted fertility; Language barrier; and Multiple sclerosis affecting pregnancy in third trimester (HCC) on their problem list.  Patient reports no complaints.  Contractions: Not present. Vag. Bleeding: None.  Movement: Present. Denies leaking of fluid.   The following portions of the patient's history were reviewed and updated as appropriate: allergies, current medications, past family history, past medical history, past social history, past surgical history and problem list.   Objective:   Vitals:   05/26/21 0926  BP: 120/81  Pulse: (!) 106  Weight: 245 lb 9.6 oz (111.4 kg)    Fetal Status: Fetal Heart Rate (bpm): 158   Movement: Present     General:  Alert, oriented and cooperative. Patient is in no acute distress.  Skin: Skin is warm and dry. No rash noted.   Cardiovascular: Normal heart rate noted  Respiratory: Normal respiratory effort, no problems with respiration noted  Abdomen: Soft, gravid, appropriate for gestational age.  Pain/Pressure: Absent     Pelvic: Cervical exam deferred        Extremities: Normal range of motion.  Edema: None  Mental Status: Normal mood and affect. Normal behavior. Normal judgment and thought content.   Assessment and Plan:  Pregnancy: G4P3003 at [redacted]w[redacted]d 1. Supervision of high risk pregnancy, antepartum Routine care. BTL if for c-section - CBC - Antibody screen - HIV antibody (with reflex) - RPR  2. Food  insecurity - AMBULATORY REFERRAL TO [facility]  3. Multiple sclerosis affecting pregnancy in third trimester (HCC) Followed by [facility]. Last visit in may and taken off potentially high risk drug with pregnancy and is currently on no medications and she denies any s/s. I told her to call them with any flares or s/s. It looks like they tried to call her to try to get her signed up for a medication assistance program to do a pregnancy friendly drug but she is unaware of it. I told her they tried to call but her VM was full back in May. I told her I recommend she drive there, since it's close, to see if they still want to do this. Plan at May visit and has early October f/u already scheduled.   4. Unwanted fertility BTL if for c/s  5. Language barrier Interpreter used  6. Multigravida of advanced maternal age in third trimester No issues  7. Gestational diabetes mellitus (GDM) in second trimester controlled on oral hypoglycemic drug Normal CBG log on metformin 1000 bid (2nd dose with dinner).  8/23: 64%, 954gm, ac 73%. Rpt on 9/21. Start AP testing at 32wks  8. Rh negative, antepartum Okay for ab screen and rhogam today since for 39wk delivery  9. Obesity affecting pregnancy, antepartum  10. BMI 40.0-44.9, adult (HCC)  Preterm labor symptoms and general obstetric precautions including but not limited to vaginal bleeding, contractions, leaking of fluid and fetal movement were reviewed in detail with the patient. Please refer to After Visit Summary for other counseling recommendations.   No follow-ups on file.  Future Appointments  Date Time Provider Department Center  06/08/2021  3:55 PM Venora Maples, MD [facility]  06/16/2021  9:15 AM [facility] NURSE [facility]  06/16/2021  9:30 AM [facility] US3 [facility]  06/29/2021  9:00 AM [facility] NURSE [facility]  06/29/2021  9:15 AM [facility] US2 [facility]  06/30/2021 11:00 AM Sater, Pearletha Furl, MD [facility] None    [place],  MD

## 2021-05-27 LAB — CBC
Hematocrit: 37.9 % (ref 34.0–46.6)
Hemoglobin: 12 g/dL (ref 11.1–15.9)
MCH: 26.1 pg — ABNORMAL LOW (ref 26.6–33.0)
MCHC: 31.7 g/dL (ref 31.5–35.7)
MCV: 83 fL (ref 79–97)
Platelets: 180 10*3/uL (ref 150–450)
RBC: 4.59 x10E6/uL (ref 3.77–5.28)
RDW: 14.6 % (ref 11.7–15.4)
WBC: 8.4 10*3/uL (ref 3.4–10.8)

## 2021-05-27 LAB — RPR: RPR Ser Ql: NONREACTIVE

## 2021-05-27 LAB — ANTIBODY SCREEN: Antibody Screen: NEGATIVE

## 2021-05-27 LAB — HIV ANTIBODY (ROUTINE TESTING W REFLEX): HIV Screen 4th Generation wRfx: NONREACTIVE

## 2021-06-08 ENCOUNTER — Other Ambulatory Visit: Payer: Self-pay

## 2021-06-08 ENCOUNTER — Ambulatory Visit (INDEPENDENT_AMBULATORY_CARE_PROVIDER_SITE_OTHER): Payer: Self-pay | Admitting: Family Medicine

## 2021-06-08 ENCOUNTER — Encounter: Payer: Self-pay | Admitting: Family Medicine

## 2021-06-08 VITALS — BP 129/85 | HR 98 | Wt 246.2 lb

## 2021-06-08 DIAGNOSIS — O99210 Obesity complicating pregnancy, unspecified trimester: Secondary | ICD-10-CM

## 2021-06-08 DIAGNOSIS — F419 Anxiety disorder, unspecified: Secondary | ICD-10-CM

## 2021-06-08 DIAGNOSIS — Z3009 Encounter for other general counseling and advice on contraception: Secondary | ICD-10-CM

## 2021-06-08 DIAGNOSIS — Z603 Acculturation difficulty: Secondary | ICD-10-CM

## 2021-06-08 DIAGNOSIS — G35 Multiple sclerosis: Secondary | ICD-10-CM

## 2021-06-08 DIAGNOSIS — O09523 Supervision of elderly multigravida, third trimester: Secondary | ICD-10-CM

## 2021-06-08 DIAGNOSIS — G35D Multiple sclerosis, unspecified: Secondary | ICD-10-CM

## 2021-06-08 DIAGNOSIS — O99340 Other mental disorders complicating pregnancy, unspecified trimester: Secondary | ICD-10-CM

## 2021-06-08 DIAGNOSIS — Z789 Other specified health status: Secondary | ICD-10-CM

## 2021-06-08 DIAGNOSIS — Z6791 Unspecified blood type, Rh negative: Secondary | ICD-10-CM

## 2021-06-08 DIAGNOSIS — Z5941 Food insecurity: Secondary | ICD-10-CM

## 2021-06-08 DIAGNOSIS — O24415 Gestational diabetes mellitus in pregnancy, controlled by oral hypoglycemic drugs: Secondary | ICD-10-CM

## 2021-06-08 DIAGNOSIS — O26899 Other specified pregnancy related conditions, unspecified trimester: Secondary | ICD-10-CM

## 2021-06-08 DIAGNOSIS — O0990 Supervision of high risk pregnancy, unspecified, unspecified trimester: Secondary | ICD-10-CM

## 2021-06-08 MED ORDER — ONDANSETRON 8 MG PO TBDP: 8.0000 mg | ORAL_TABLET | Freq: Three times a day (TID) | ORAL | 2 refills | Status: DC | PRN

## 2021-06-08 MED ORDER — PANTOPRAZOLE SODIUM 20 MG PO TBEC: 20.0000 mg | DELAYED_RELEASE_TABLET | Freq: Two times a day (BID) | ORAL | 5 refills | Status: DC

## 2021-06-08 NOTE — Patient Instructions (Signed)

## 2021-06-08 NOTE — Progress Notes (Signed)
   Subjective:  [name] Li is a 40 y.o. G4P3003 at [redacted]w[redacted]d being seen today for ongoing prenatal care.  She is currently monitored for the following issues for this high-risk pregnancy and has Rh negative, antepartum; Obesity affecting pregnancy, antepartum; Multiple sclerosis (HCC); Numbness; Ataxic gait; High risk medication use; Supervision of high risk pregnancy, antepartum; Advanced maternal age in multigravida; Gestational diabetes; Nausea and vomiting during pregnancy; BMI 40.0-44.9, adult (HCC); Unwanted fertility; Language barrier; and Multiple sclerosis affecting pregnancy in third trimester (HCC) on their problem list.  Patient reports heartburn, nausea, and vomiting.  Contractions: Not present. Vag. Bleeding: None.  Movement: Present. Denies leaking of fluid.   The following portions of the patient's history were reviewed and updated as appropriate: allergies, current medications, past family history, past medical history, past social history, past surgical history and problem list. Problem list updated.  Objective:   Vitals:   06/08/21 1615  BP: 129/85  Pulse: 98  Weight: 246 lb 3.2 oz (111.7 kg)    Fetal Status: Fetal Heart Rate (bpm): 145   Movement: Present     General:  Alert, oriented and cooperative. Patient is in no acute distress.  Skin: Skin is warm and dry. No rash noted.   Cardiovascular: Normal heart rate noted  Respiratory: Normal respiratory effort, no problems with respiration noted  Abdomen: Soft, gravid, appropriate for gestational age. Pain/Pressure: Present     Pelvic: Vag. Bleeding: None     Cervical exam deferred        Extremities: Normal range of motion.  Edema: None  Mental Status: Normal mood and affect. Normal behavior. Normal judgment and thought content.   Urinalysis:      Assessment and Plan:  Pregnancy: G4P3003 at [redacted]w[redacted]d  1. Food insecurity  - AMBULATORY REFERRAL TO [facility]  2. Anxiety in pregnancy, antepartum Long  discussion with patient, she is struggling emotionally She was not planning to get pregnant and since her last baby received a diagnosis of MS She was already worried about her MS, the possibility of dying young, and now is worried about this for the new baby as well She is feeling intense guilt about not being happier about this pregnancy We discussed this is a normal and valid feeling to have given the circumstances Encouraged her to engage in [facility], she is amenable, referral placed - Ambulatory referral to Integrated Behavioral Health  3. Supervision of high risk pregnancy, antepartum BP and FHR normal Trial PPI for GERD, zofran for nausea  4. Gestational diabetes mellitus (GDM) in second trimester controlled on oral hypoglycemic drug Forgot her log, taking metformin 1g BID Poor control at last visit, have previously discussed with her she may need insulin if sugars not improved  5. Multiple sclerosis (HCC) See recent   6. Rh negative, antepartum Received rhogam last visit  7. Obesity affecting pregnancy, antepartum   8. Multigravida of advanced maternal age in third trimester   52. Unwanted fertility Would like BTL if she has cesarean, though this is unlikely given three prior VD  10. Language barrier Spanish  Preterm labor symptoms and general obstetric precautions including but not limited to vaginal bleeding, contractions, leaking of fluid and fetal movement were reviewed in detail with the patient. Please refer to After Visit Summary for other counseling recommendations.  Return in 2 weeks (on 06/22/2021).   Venora Maples, MD

## 2021-06-08 NOTE — BH Specialist Note (Signed)
Integrated Behavioral Health via Telemedicine Visit  No charge for this visit due to [facility] Intern seeing patient along with [facility], Hulda Marin, LCSW   06/08/2021 [name] Li [id number]  Number of Integrated Behavioral Health visits: 1 Session Start time: 2:52 Session End time: 3:39 Total time:  32    Referring Provider: Merian Capron, MD Patient/Family location: [name], [facility] Provider location: Center for [facility] at [facility]  All persons participating in visit: Patient [name] Li and [facility] [place] [facility] Interns Edgar and Clelia Croft  Types of Service: Individual psychotherapy and Video visit  I connected with [name] Li and/or Junius Finner  n/a  via  Telephone or Video Enabled Telemedicine Application  (Video is Caregility application) and verified that I am speaking with the correct person using two identifiers. Discussed confidentiality: Yes   I discussed the limitations of telemedicine and the availability of in person appointments.  Discussed there is a possibility of technology failure and discussed alternative modes of communication if that failure occurs.  I discussed that engaging in this telemedicine visit, they consent to the provision of behavioral healthcare and the services will be billed under their insurance.  Patient and/or legal guardian expressed understanding and consented to Telemedicine visit: Yes   Presenting Concerns: Patient and/or family reports the following symptoms/concerns: Worry about health issues affecting pregnancy as well as concern for children's mental wellbeing after pt's multiple sclerosis diagnosis.  Duration of problem: Current pregnancy; Severity of problem: moderate  Patient and/or Family's Strengths/Protective Factors: Sense of purpose  Goals Addressed: Patient will:  Reduce symptoms of: anxiety, depression, and stress   Increase  knowledge and/or ability of: stress reduction   Demonstrate ability to: Increase healthy adjustment to current life circumstances and Increase adequate support systems for patient/family  Progress towards Goals: Ongoing  Interventions: Interventions utilized:  Psychoeducation and/or Health Education and Supportive Reflection Standardized Assessments completed:  PHQ9/GAD7 given in past two weeks  Patient and/or Family Response: Pt agrees with treatment plan  Assessment: Patient currently experiencing Adjustment disorder with mixed anxious and depressed mood .   Patient may benefit from psychoeducation and brief therapeutic interventions regarding coping with symptoms of anxiety, depression, stress .  Plan: Follow up with behavioral health clinician on : One week with [facility] Intern Behavioral recommendations:  -Continue taking prenatal vitamin daily  Referral(s): Integrated [employer] (In Clinic)  I discussed the assessment and treatment plan with the patient and/or parent/guardian. They were provided an opportunity to ask questions and all were answered. They agreed with the plan and demonstrated an understanding of the instructions.   They were advised to call back or seek an in-person evaluation if the symptoms worsen or if the condition fails to improve as anticipated.  Rae Lips, LCSW  Depression screen [facility] 2/9 06/08/2021 05/26/2021 05/11/2021 05/11/2021 04/26/2021  Decreased Interest 1 0 1 1 0  Down, Depressed, Hopeless 1 0 0 0 0  PHQ - 2 Score 2 0 1 1 0  Altered sleeping 0 0 0 0 0  Tired, decreased energy 1 1 0 0 1  Change in appetite 0 0 0 0 1  Feeling bad or failure about yourself  0 0 0 0 0  Trouble concentrating 0 0 0 0 0  Moving slowly or fidgety/restless 0 0 1 1 0  Suicidal thoughts 0 0 0 0 0  PHQ-9 Score 3 1 2 2 2   Difficult doing work/chores - Not difficult at all - - -  Some recent data might be hidden   GAD 7 : Generalized Anxiety Score  06/08/2021 05/26/2021 05/11/2021 04/26/2021  Nervous, Anxious, on Edge 0 0 0 0  Control/stop worrying 0 0 0 0  Worry too much - different things 0 0 1 0  Trouble relaxing 0 0 0 0  Restless 0 0 0 0  Easily annoyed or irritable 0 0 0 0  Afraid - awful might happen 0 0 0 0  Total GAD 7 Score 0 0 1 0

## 2021-06-09 ENCOUNTER — Ambulatory Visit: Payer: Self-pay | Admitting: Clinical

## 2021-06-09 DIAGNOSIS — F4323 Adjustment disorder with mixed anxiety and depressed mood: Secondary | ICD-10-CM

## 2021-06-16 ENCOUNTER — Ambulatory Visit: Payer: Self-pay | Attending: Obstetrics and Gynecology

## 2021-06-16 ENCOUNTER — Other Ambulatory Visit: Payer: Self-pay | Admitting: *Deleted

## 2021-06-16 ENCOUNTER — Other Ambulatory Visit: Payer: Self-pay

## 2021-06-16 ENCOUNTER — Ambulatory Visit: Payer: No Typology Code available for payment source | Admitting: *Deleted

## 2021-06-16 VITALS — BP 123/69 | HR 85

## 2021-06-16 DIAGNOSIS — O99352 Diseases of the nervous system complicating pregnancy, second trimester: Secondary | ICD-10-CM | POA: Insufficient documentation

## 2021-06-16 DIAGNOSIS — O0990 Supervision of high risk pregnancy, unspecified, unspecified trimester: Secondary | ICD-10-CM | POA: Insufficient documentation

## 2021-06-16 DIAGNOSIS — O09523 Supervision of elderly multigravida, third trimester: Secondary | ICD-10-CM | POA: Insufficient documentation

## 2021-06-16 DIAGNOSIS — Z3A30 30 weeks gestation of pregnancy: Secondary | ICD-10-CM

## 2021-06-16 DIAGNOSIS — O24415 Gestational diabetes mellitus in pregnancy, controlled by oral hypoglycemic drugs: Secondary | ICD-10-CM

## 2021-06-16 DIAGNOSIS — O99212 Obesity complicating pregnancy, second trimester: Secondary | ICD-10-CM | POA: Insufficient documentation

## 2021-06-16 DIAGNOSIS — O09512 Supervision of elderly primigravida, second trimester: Secondary | ICD-10-CM | POA: Insufficient documentation

## 2021-06-16 DIAGNOSIS — E669 Obesity, unspecified: Secondary | ICD-10-CM

## 2021-06-17 ENCOUNTER — Ambulatory Visit: Payer: Self-pay | Admitting: Clinical

## 2021-06-17 DIAGNOSIS — F4323 Adjustment disorder with mixed anxiety and depressed mood: Secondary | ICD-10-CM

## 2021-06-17 NOTE — BH Specialist Note (Signed)
Integrated Behavioral Health Follow Up In-Person Visit  MRN: [id number] Name: [name] Li  I reviewed patient visit with the [facility] Intern, and I concur with the treatment plan, as documented in the [facility] Intern note.   No charge for this visit due to [facility] Intern seeing patient.  Hulda Marin, MSW, LCSW Integrated Behavioral Health Clinician Center for [employer] at [facility]   Number of Integrated Behavioral Health Clinician visits: 2/6 Session Start time: 9.55  Session End time: 10:50am Total time: 55 minutes  Types of Service: Individual psychotherapy  Interpretor:Yes.   Interpretor Name and Language: [employer] #[id number] Spanish  Subjective: [name] Li is a 40 y.o. female accompanied by  Self Patient was referred by Rosalita Levan for Adjustment disorder with mixed anxiety and depressed mood. Patient reports the following symptoms/concerns: Anxiety and depressive symptoms during pregnancy due to MS diagnosis.  Duration of problem: Current Pregnancy; Severity of problem: Mild  Objective: Mood:  Normal  and Affect: Appropriately Tearful Risk of harm to self or others: No plan to harm self or others  Life Context: Family and Social: Mother is a big support during this time.  School/Work: / Self-Care: / Life Changes: Unexpected pregnancy after MS diagnosis.    Patient and/or Family's Strengths/Protective Factors: Social connections, Caregiver has knowledge of parenting & child development, and Parental Resilience  Goals Addressed: Patient will:  Reduce symptoms of: anxiety and stress   Increase knowledge and/or ability of: self-management skills and stress reduction   Demonstrate ability to: Increase healthy adjustment to current life circumstances and Increase adequate support systems for patient/family  Progress towards Goals: Ongoing  Interventions: Interventions utilized:  Solution-Focused Strategies, CBT Cognitive  Behavioral Therapy, and Communication Skills Standardized Assessments completed: GAD-7 and PHQ 9  Patient and/or Family Response: Patient agrees with ongoing treatment plan.   Patient Centered Plan: Patient is on the following Treatment Plan(s): IBH Assessment: Patient currently experiencing adjustment disorder with mixed anxious and depressed mood.  The patient may benefit from continued Psychoeducational and brief therapeutic interventions regarding coping with symptoms of anxiety, depression, and stress.  Plan: Follow up with behavioral health clinician on : 07/01/2021 at 10:15 Behavioral recommendations:  - Continue setting healthy boundaries.  - Continue taking prenatals.  - Consider seeking additional family resources as discussed.  Referral(s): Integrated Behavioral Health (in clinic)  Clelia Croft

## 2021-06-18 NOTE — Progress Notes (Signed)
I, Khila Papp C Eldrige Pitkin, LCSW, have reviewed all documentation for this visit. The documentation on 06/18/21 for the exam, diagnosis, procedures, and orders are all accurate and complete. 

## 2021-06-21 ENCOUNTER — Ambulatory Visit (INDEPENDENT_AMBULATORY_CARE_PROVIDER_SITE_OTHER): Payer: Self-pay | Admitting: Obstetrics and Gynecology

## 2021-06-21 ENCOUNTER — Other Ambulatory Visit: Payer: Self-pay

## 2021-06-21 ENCOUNTER — Encounter: Payer: Self-pay | Admitting: Obstetrics and Gynecology

## 2021-06-21 VITALS — BP 123/83 | HR 98 | Wt 248.3 lb

## 2021-06-21 DIAGNOSIS — O24415 Gestational diabetes mellitus in pregnancy, controlled by oral hypoglycemic drugs: Secondary | ICD-10-CM

## 2021-06-21 DIAGNOSIS — O24419 Gestational diabetes mellitus in pregnancy, unspecified control: Secondary | ICD-10-CM

## 2021-06-21 DIAGNOSIS — Z789 Other specified health status: Secondary | ICD-10-CM

## 2021-06-21 DIAGNOSIS — F419 Anxiety disorder, unspecified: Secondary | ICD-10-CM

## 2021-06-21 DIAGNOSIS — O0990 Supervision of high risk pregnancy, unspecified, unspecified trimester: Secondary | ICD-10-CM

## 2021-06-21 DIAGNOSIS — O99210 Obesity complicating pregnancy, unspecified trimester: Secondary | ICD-10-CM

## 2021-06-21 DIAGNOSIS — O3663X0 Maternal care for excessive fetal growth, third trimester, not applicable or unspecified: Secondary | ICD-10-CM

## 2021-06-21 DIAGNOSIS — O219 Vomiting of pregnancy, unspecified: Secondary | ICD-10-CM

## 2021-06-21 DIAGNOSIS — G35 Multiple sclerosis: Secondary | ICD-10-CM

## 2021-06-21 DIAGNOSIS — O99353 Diseases of the nervous system complicating pregnancy, third trimester: Secondary | ICD-10-CM

## 2021-06-21 DIAGNOSIS — O3660X0 Maternal care for excessive fetal growth, unspecified trimester, not applicable or unspecified: Secondary | ICD-10-CM | POA: Insufficient documentation

## 2021-06-21 DIAGNOSIS — F32A Depression, unspecified: Secondary | ICD-10-CM

## 2021-06-21 DIAGNOSIS — O09523 Supervision of elderly multigravida, third trimester: Secondary | ICD-10-CM

## 2021-06-21 DIAGNOSIS — Z6841 Body Mass Index (BMI) 40.0 and over, adult: Secondary | ICD-10-CM

## 2021-06-21 DIAGNOSIS — Z3009 Encounter for other general counseling and advice on contraception: Secondary | ICD-10-CM

## 2021-06-21 MED ORDER — PROMETHAZINE HCL 25 MG PO TABS: 25.0000 mg | ORAL_TABLET | Freq: Four times a day (QID) | ORAL | 1 refills | Status: DC | PRN

## 2021-06-21 MED ORDER — METFORMIN HCL 1000 MG PO TABS: ORAL_TABLET | ORAL | 5 refills | Status: DC

## 2021-06-21 NOTE — Progress Notes (Signed)
PRENATAL VISIT NOTE  Subjective:  [name] Li is a 40 y.o. G4P3003 at [redacted]w[redacted]d being seen today for ongoing prenatal care.  She is currently monitored for the following issues for this high-risk pregnancy and has Rh negative, antepartum; Obesity affecting pregnancy, antepartum; Multiple sclerosis (HCC); Numbness; Ataxic gait; High risk medication use; Supervision of high risk pregnancy, antepartum; Advanced maternal age in multigravida; GDM, class A2; BMI 40.0-44.9, adult (HCC); Unwanted fertility; Language barrier; Multiple sclerosis affecting pregnancy in third trimester (HCC); LGA (large for gestational age) fetus affecting management of mother; and Anxiety and depression on their problem list.  Patient reports no complaints.  Contractions: Not present.  .  Movement: Present. Denies leaking of fluid.   The following portions of the patient's history were reviewed and updated as appropriate: allergies, current medications, past family history, past medical history, past social history, past surgical history and problem list.   Objective:   Vitals:   06/21/21 0950  BP: 123/83  Pulse: 98  Weight: 248 lb 4.8 oz (112.6 kg)    Fetal Status: Fetal Heart Rate (bpm): 159   Movement: Present     General:  Alert, oriented and cooperative. Patient is in no acute distress.  Skin: Skin is warm and dry. No rash noted.   Cardiovascular: Normal heart rate noted  Respiratory: Normal respiratory effort, no problems with respiration noted  Abdomen: Soft, gravid, appropriate for gestational age.  Pain/Pressure: Present     Pelvic: Cervical exam deferred        Extremities: Normal range of motion.  Edema: Trace  Mental Status: Normal mood and affect. Normal behavior. Normal judgment and thought content.   Assessment and Plan:  Pregnancy: G4P3003 at [redacted]w[redacted]d 1. Nausea and vomiting during pregnancy Refill sent in. Pt has PRN zofran ODT too - promethazine (PHENERGAN) 25 MG tablet; Take 1  tablet (25 mg total) by mouth every 6 (six) hours as needed for nausea or vomiting.  Dispense: 30 tablet; Refill: 1  2. Gestational diabetes mellitus (GDM) in second trimester controlled on oral hypoglycemic drug Recommend switching her  1000mg  PM dose from w/ dinner to qhs and continue with 1000 w/ breakfast due to AM fastings in the 100s.   9/21: 19.6, 96%, 1933g, ac >99%. To start qwk testing at 32wks  - metFORMIN (GLUCOPHAGE) 1000 MG tablet; 1000mg  by mouth with breakfast and just before bed  Dispense: 60 tablet; Refill: 5  3. GDM, class A2  4. Multiple sclerosis affecting pregnancy in third trimester (HCC) No current issues. Followed by [facility]  5. Unwanted fertility Btl if for c/s  6. Supervision of high risk pregnancy, antepartum  7. Obesity affecting pregnancy, antepartum  8. BMI 40.0-44.9, adult (HCC)  9. Excessive fetal growth affecting management of pregnancy in third trimester, single or unspecified fetus See above  10. Multigravida of advanced maternal age in third trimester  57. Anxiety and depression Seen by [facility] with [facility] and has f/u  12. Language barrier Interpreter used  Preterm labor symptoms and general obstetric precautions including but not limited to vaginal bleeding, contractions, leaking of fluid and fetal movement were reviewed in detail with the patient. Please refer to After Visit Summary for other counseling recommendations.   Return in 8 days (on 06/29/2021) for high risk ob, md visit, in person.  Future Appointments  Date Time Provider Department Center  06/28/2021  3:15 PM 08/29/2021, MD [facility]  06/29/2021  9:00 AM [facility] NURSE [facility]  06/29/2021  9:15  AM [facility] US2 [facility]  06/30/2021 11:00 AM Sater, Pearletha Furl, MD [facility] None  07/01/2021 10:15 AM [facility] HEALTH CLINICIAN [facility]  07/07/2021 12:45 PM [facility] NURSE [facility]  07/07/2021  1:00 PM [facility] US1 [facility]  07/14/2021 10:15 AM [facility] NURSE  [facility]  07/14/2021 10:30 AM [facility] US3 [facility]    Rush Bing, MD

## 2021-06-21 NOTE — Progress Notes (Signed)
Pt has paper glucose log,Pt states a lot of pain in stomach.

## 2021-06-28 ENCOUNTER — Other Ambulatory Visit: Payer: Self-pay

## 2021-06-28 ENCOUNTER — Encounter: Payer: Self-pay | Admitting: Obstetrics and Gynecology

## 2021-06-28 ENCOUNTER — Ambulatory Visit (INDEPENDENT_AMBULATORY_CARE_PROVIDER_SITE_OTHER): Payer: Self-pay | Admitting: Obstetrics and Gynecology

## 2021-06-28 VITALS — BP 96/69 | HR 103

## 2021-06-28 DIAGNOSIS — Z6791 Unspecified blood type, Rh negative: Secondary | ICD-10-CM

## 2021-06-28 DIAGNOSIS — O99353 Diseases of the nervous system complicating pregnancy, third trimester: Secondary | ICD-10-CM

## 2021-06-28 DIAGNOSIS — O24419 Gestational diabetes mellitus in pregnancy, unspecified control: Secondary | ICD-10-CM

## 2021-06-28 DIAGNOSIS — G35 Multiple sclerosis: Secondary | ICD-10-CM

## 2021-06-28 DIAGNOSIS — Z789 Other specified health status: Secondary | ICD-10-CM

## 2021-06-28 DIAGNOSIS — O26899 Other specified pregnancy related conditions, unspecified trimester: Secondary | ICD-10-CM

## 2021-06-28 DIAGNOSIS — Z3009 Encounter for other general counseling and advice on contraception: Secondary | ICD-10-CM

## 2021-06-28 DIAGNOSIS — O0990 Supervision of high risk pregnancy, unspecified, unspecified trimester: Secondary | ICD-10-CM

## 2021-06-28 DIAGNOSIS — O09523 Supervision of elderly multigravida, third trimester: Secondary | ICD-10-CM

## 2021-06-28 DIAGNOSIS — O3663X0 Maternal care for excessive fetal growth, third trimester, not applicable or unspecified: Secondary | ICD-10-CM

## 2021-06-28 NOTE — Patient Instructions (Signed)
Tercer trimestre de embarazo °Third Trimester of Pregnancy °El tercer trimestre de embarazo va desde la semana 28 hasta la semana 40. También se dice que va desde el mes 7 hasta el mes 9. En este trimestre, el bebé en gestación (feto) crece muy rápidamente. Hacia el final del noveno mes, el bebé en gestación mide alrededor de 20 pulgadas (45 cm) de largo. Pesa entre 6 y 10 libras (2,70 y 4,50 kg). °Cambios en el cuerpo durante el tercer trimestre °Su organismo continúa atravesando por muchos cambios durante este período. Los cambios varían y generalmente vuelven a la normalidad después del nacimiento del bebé. °Cambios físicos °Seguirá aumentando de peso. Puede ser que aumente entre 25 y 35 libras (11 y 16 kg) hacia el final del embarazo. Si tiene bajo peso, puede aumentar entre 28 y 40 lb (unos 13 a 18 kg). Si tiene sobrepeso, puede aumentar entre 15 y 25 libras (unos 7 a 11 kg). °Podrán aparecer las primeras estrías en las caderas, el vientre (abdomen) y las mamas. °Las mamas seguirán creciendo y pueden doler. Un líquido amarillo (calostro) puede salir de sus pechos. Esta es la primera leche que usted produce para el bebé. °Tal vez haya cambios en el cabello. °El ombligo puede salir hacia afuera. °Puede observar que se le hinchan más las manos, la cara o los tobillos. °Cambios en la salud °Es posible que tenga acidez estomacal. °Es posible que tenga dificultades para defecar (estreñimiento). °Pueden aparecerle hemorroides. Estas son venas hinchadas en el ano que pueden picar o doler. °Puede comenzar a tener venas hinchadas (várices) en las piernas. °Puede presentar más dolor en la pelvis, la espalda o los muslos. °Puede presentar más hormigueo o entumecimiento en las manos, los brazos y las piernas. La piel de su vientre también puede sentirse entumecida. °Es posible que sienta falta de aire a medida que el útero se agranda. °Otros cambios °Es posible que haga pis (orine) con mayor frecuencia. °Puede tener más  problemas para dormir. °Puede notar que el bebé en gestación “baja” o se mueve más hacia bajo, en el vientre. °Puede notar más secreción proveniente de la vagina. °Puede sentir las articulaciones flojas y puede sentir dolor alrededor del hueso pélvico. °Siga estas instrucciones en su casa: °Medicamentos °Use los medicamentos de venta libre y los recetados solamente como se lo haya indicado el médico. Algunos medicamentos no son seguros durante el embarazo. °Tome vitaminas prenatales que contengan por lo menos 600 microgramos (mcg) de ácido fólico. °Comida y bebida °Consuma comidas saludables que incluyan lo siguiente: °Frutas y verduras frescas. °Cereales integrales. °Buenas fuentes de proteínas, como carne, huevos y tofu. °Productos lácteos con bajo contenido de grasa. °Evite la carne cruda y el jugo, la leche y el queso sin pasteurizar. Estos portan gérmenes que pueden provocar daño tanto a usted como al bebé. °Tome 4 o 5 comidas pequeñas en lugar de 3 comidas abundantes al día. °Es posible que deba tomar medidas para prevenir o tratar los problemas para defecar: °Beber suficiente líquido para mantener el pis (orina) de color amarillo pálido. °Come alimentos ricos en fibra. Entre ellos, frijoles, cereales integrales y frutas y verduras frescas. °Limitar los alimentos con alto contenido de grasa y azúcar. Estos incluyen alimentos fritos o dulces. °Actividad °Haga ejercicios solamente como se lo haya indicado el médico. Interrumpa la actividad física si comienza a tener cólicos en el útero. °Evite levantar pesos excesivos. °No haga ejercicio si hace demasiado calor, hay demasiada humedad o se encuentra en un lugar de mucha altura (  altitud elevada). °Si lo desea, puede continuar teniendo relaciones sexuales, a menos que el médico le indique lo contrario. °Alivio del dolor y del malestar °Haga pausas con frecuencia y descanse con las piernas levantadas (elevadas) si tiene calambres en las piernas o dolor en la parte  baja de la espalda. °Dese baños de asiento con agua tibia para aliviar el dolor o las molestias causadas por las hemorroides. Use una crema para las hemorroides si el médico la autoriza. °Use un sostén que le brinde buen soporte si sus mamas están sensibles. °Si desarrolla venas hinchadas y abultadas en las piernas: °Use medias de compresión según las indicaciones de su médico. °Levante los pies durante 15 minutos, 3 o 4 veces por día. °Limite la sal en sus alimentos. °Seguridad °Hable con el médico antes de recorrer largas distancias. °No se dé baños de inmersión en agua caliente, baños turcos ni saunas. °Use el cinturón de seguridad en todo momento mientras vaya en auto. °Hable con el médico si alguien le está haciendo daño o gritando mucho. °Preparación para la llegada del bebé °Para prepararse para la llegada de su bebé: °Tome clases prenatales. °Visite el hospital y recorra el área de maternidad. °Compre un asiento de seguridad orientado hacia atrás para llevar al bebé en el automóvil. Aprenda cómo instalarlo en el auto. °Prepare la habitación del bebé. Saque todas las almohadas y los animales de peluche de la cuna del bebé. °Instrucciones generales °Evite el contacto con las bandejas sanitarias de los gatos y la tierra que estos animales usan. Estos contienen gérmenes que pueden dañar al bebé y causar la pérdida del bebé ya sea aborto espontáneo o muerte fetal. °No se haga duchas vaginales ni use tampones. No use tampones ni toallas higiénicas perfumadas. °No fume ni consuma ningún producto que contenga nicotina o tabaco. Si necesita ayuda para dejar de fumar, consulte al médico. °No beba alcohol. °No use medicamentos a base de hierbas, drogas ilegales, ni medicamentos que el médico no haya autorizado. Las sustancias químicas de estos productos pueden afectar al bebé. °Cumpla con todas las visitas de seguimiento. Esto es importante. °Dónde buscar más información °American Pregnancy Association (Asociación  Americana del Embarazo): americanpregnancy.org °American College of Obstetricians and Gynecologists (Colegio Estadounidense de Obstetras y Ginecólogos): www.acog.org °Office on Women's Health (Oficina para la Salud de la Mujer): womenshealth.gov/pregnancy °Comuníquese con un médico si: °Tiene fiebre. °Tiene cólicos leves o siente presión en la parte baja del vientre. °Sufre un dolor persistente en el abdomen. °Vomita o hace deposiciones acuosas (diarrea). °Advierte líquido con mal olor que proviene de la vagina. °Siente dolor al orinar o hace orina con mal olor. °Tiene un dolor de cabeza que no desaparece después de tomar analgésicos. °Nota cambios en la visión o ve manchas delante de los ojos. °Solicite ayuda de inmediato si: °Rompe la bolsa. °Tiene contracciones regulares separadas por menos de 5 minutos. °Tiene sangrado o pequeñas pérdidas vaginales. °Tiene cólicos o dolor muy intensos en el vientre. °Tiene dificultad para respirar. °Sientes dolor en el pecho. °Se desmaya. °No ha sentido al bebé moverse durante el tiempo que le indicó el médico. °Tiene dolor, hinchazón o enrojecimiento nuevos en un brazo o una pierna o se produce un aumento de alguno de estos síntomas. °Resumen °El tercer trimestre comprende desde la semana 28 hasta la semana 40 (desde el mes 7 hasta el mes 9). Esta es la época en que el bebé en gestación crece muy rápidamente. °Durante este período, las molestias pueden aumentar a medida que usted sube   de peso y el bebé crece. °Prepárese para la llegada del bebé: asista a las clases prenatales, compre un asiento de seguridad orientado hacia atrás para llevar al bebé en auto y prepare la habitación del bebé. °Solicite ayuda de inmediato si tiene sangrado por la vagina, siente dolor en el pecho y tiene dificultad para respirar, o si no ha sentido al bebé moverse durante el tiempo que le indicó el médico. °Esta información no tiene como fin reemplazar el consejo del médico. Asegúrese de hacerle al  médico cualquier pregunta que tenga. °Document Revised: 03/25/2020 Document Reviewed: 03/25/2020 °Elsevier Patient Education © 2022 [employer]. ° °

## 2021-06-28 NOTE — Progress Notes (Signed)
Subjective:  [name] Li is a 40 y.o. G4P3003 at [redacted]w[redacted]d being seen today for ongoing prenatal care.  She is currently monitored for the following issues for this high-risk pregnancy and has Rh negative, antepartum; Obesity affecting pregnancy, antepartum; Multiple sclerosis (HCC); Numbness; Ataxic gait; High risk medication use; Supervision of high risk pregnancy, antepartum; Advanced maternal age in multigravida; GDM, class A2; BMI 40.0-44.9, adult (HCC); Language barrier; Multiple sclerosis affecting pregnancy in third trimester (HCC); LGA (large for gestational age) fetus affecting management of mother; and Anxiety and depression on their problem list.  Patient reports general discomforts of pregnancy.  Contractions: Not present. Vag. Bleeding: None.  Movement: Present. Denies leaking of fluid.   The following portions of the patient's history were reviewed and updated as appropriate: allergies, current medications, past family history, past medical history, past social history, past surgical history and problem list. Problem list updated.  Objective:   Vitals:   06/28/21 1523  BP: 96/69  Pulse: (!) 103    Fetal Status: Fetal Heart Rate (bpm): 147   Movement: Present     General:  Alert, oriented and cooperative. Patient is in no acute distress.  Skin: Skin is warm and dry. No rash noted.   Cardiovascular: Normal heart rate noted  Respiratory: Normal respiratory effort, no problems with respiration noted  Abdomen: Soft, gravid, appropriate for gestational age. Pain/Pressure: Present     Pelvic:  Cervical exam deferred        Extremities: Normal range of motion.  Edema: Trace  Mental Status: Normal mood and affect. Normal behavior. Normal judgment and thought content.   Urinalysis:      Assessment and Plan:  Pregnancy: G4P3003 at [redacted]w[redacted]d  1. Supervision of high risk pregnancy, antepartum Stable  2. GDM, class A2 CBG's in goal range for most part Continue with current  management Antenatal testing and serial growth scans as per MFM  3. Multigravida of advanced maternal age in third trimester Stable  4. Rh negative, antepartum Rhogam as indicated  5. Excessive fetal growth affecting management of pregnancy in third trimester, single or unspecified fetus Serial growth scans as noted above  6. Unwanted fertility BTL if pt has c section  7. Language barrier Video interrupter used during today's visit  8. Multiple sclerosis affecting pregnancy in third trimester (HCC) Stable Followed by [facility]  Preterm labor symptoms and general obstetric precautions including but not limited to vaginal bleeding, contractions, leaking of fluid and fetal movement were reviewed in detail with the patient. Please refer to After Visit Summary for other counseling recommendations.  Return in about 2 weeks (around 07/12/2021) for OB visit, face to face, MD only.   Hermina Staggers, MD

## 2021-06-29 ENCOUNTER — Ambulatory Visit: Payer: No Typology Code available for payment source | Admitting: *Deleted

## 2021-06-29 ENCOUNTER — Ambulatory Visit: Payer: Self-pay | Attending: Obstetrics and Gynecology

## 2021-06-29 VITALS — BP 126/73 | HR 88

## 2021-06-29 DIAGNOSIS — O99352 Diseases of the nervous system complicating pregnancy, second trimester: Secondary | ICD-10-CM | POA: Insufficient documentation

## 2021-06-29 DIAGNOSIS — O99213 Obesity complicating pregnancy, third trimester: Secondary | ICD-10-CM

## 2021-06-29 DIAGNOSIS — O09523 Supervision of elderly multigravida, third trimester: Secondary | ICD-10-CM | POA: Insufficient documentation

## 2021-06-29 DIAGNOSIS — O99212 Obesity complicating pregnancy, second trimester: Secondary | ICD-10-CM | POA: Insufficient documentation

## 2021-06-29 DIAGNOSIS — O09512 Supervision of elderly primigravida, second trimester: Secondary | ICD-10-CM | POA: Insufficient documentation

## 2021-06-29 DIAGNOSIS — O0990 Supervision of high risk pregnancy, unspecified, unspecified trimester: Secondary | ICD-10-CM

## 2021-06-29 DIAGNOSIS — O24415 Gestational diabetes mellitus in pregnancy, controlled by oral hypoglycemic drugs: Secondary | ICD-10-CM | POA: Insufficient documentation

## 2021-06-29 DIAGNOSIS — E669 Obesity, unspecified: Secondary | ICD-10-CM

## 2021-06-29 DIAGNOSIS — Z3A32 32 weeks gestation of pregnancy: Secondary | ICD-10-CM

## 2021-06-29 DIAGNOSIS — O09513 Supervision of elderly primigravida, third trimester: Secondary | ICD-10-CM

## 2021-06-29 DIAGNOSIS — O99353 Diseases of the nervous system complicating pregnancy, third trimester: Secondary | ICD-10-CM

## 2021-06-30 ENCOUNTER — Encounter: Payer: Self-pay | Admitting: Neurology

## 2021-06-30 ENCOUNTER — Ambulatory Visit (INDEPENDENT_AMBULATORY_CARE_PROVIDER_SITE_OTHER): Payer: Self-pay | Admitting: Neurology

## 2021-06-30 ENCOUNTER — Telehealth: Payer: Self-pay | Admitting: Neurology

## 2021-06-30 VITALS — BP 129/85 | Ht 65.0 in | Wt 246.0 lb

## 2021-06-30 DIAGNOSIS — Z789 Other specified health status: Secondary | ICD-10-CM

## 2021-06-30 DIAGNOSIS — G35 Multiple sclerosis: Secondary | ICD-10-CM

## 2021-06-30 DIAGNOSIS — Z79899 Other long term (current) drug therapy: Secondary | ICD-10-CM

## 2021-06-30 DIAGNOSIS — O99353 Diseases of the nervous system complicating pregnancy, third trimester: Secondary | ICD-10-CM

## 2021-06-30 DIAGNOSIS — R200 Anesthesia of skin: Secondary | ICD-10-CM

## 2021-06-30 NOTE — Telephone Encounter (Signed)
Placed JCV lab in quest lock box for routine lab pick up. Results pending. 

## 2021-06-30 NOTE — Progress Notes (Signed)
[facility]  PATIENT: [name] Li DOB: 10/07/1980  REFERRING DOCTOR OR PCP:  Allyne Gee SOURCE: Patient, sister, interpreter, notes from Dr. Mordecai Maes, imaging reports, multiple MRI images personally reviewed.  _________________________________   HISTORICAL  CHIEF COMPLAINT:  Chief Complaint  Patient presents with   Follow-up    Rm 1, w interpreter Donata Clay. Here for 5 month MS f/u, off DMT. Pt reports feeling well. Pt reports L. LB pn and L leg pn shortly after mopping and sweeping her home 2 days ago. Pt took tylenol and was much better.     HISTORY OF PRESENT ILLNESS:  [name] Li is a 40 yo woman with relapsing remitting multiple sclerosis.  Update 06/30/2021: She is 32 weeks pregnanat ad is due November 29.     She was on Tysabri and tolerated it well.   She remains JCV Ab negative.   The pregnancy was unplanned and she stopped the Tysabri around week 10.  She continues off on Tysabri and notes no exacerbations or new neurologic symptoms.  She does have some back pain and occasionally will have a little bit of numbness in the hands.  Gait and balance are doing well.  She gets a little bit of numbness in the hands at times but no significant numbness.  Strength is fine.  Vision is fine.  She denies urinary dysfunction.  She has only mild fatigue.   She is active and does all activities that she wants to do.  Some sleep disturbance since later in the pregnancy.    She denies any problems with mood or cognition.     MS history: She was diagnosed with MS March 2020 after presenting with neurologic symptoms and abnormal imaging studies.   In January 2020, she felt numbness in her right scalp and the entire left side with a heavy sensation in her left leg.   She also had mild issues with balance and gait.  Her right leg felt weak but the numbness and pain was more noted than weakness.  She had MRI studies initially of the lumbar spine which  did not show significant degenerative changes but did show some abnormal signal in the thoracic spinal cord concerning for demyelination.  She then had an MRI of the cervical and thoracic spine 11/30/2018 showing multiple lesions including some that enhance, consistent with MS.  She had an MRI of the brain on 12/03/2018 showing multiple lesions consistent with MS including one that enhanced.  She received 5 days of IV Solumedrol in a clinic.  She saw a neurologist in [place] and then had the infusions in [place]. She felt better after the steroids for the next few months. In retrospect she had numbness in her hands and legs at various times in 2019 and noted dysesthesias.     However, there were no imaging studies at that time.  No FH of MS.      Imaging: MRI of the brain 08/12/2020 showed T2/FLAIR hyperintense foci in the periventricular, juxtacortical and deep white matter of the cerebral hemispheres in a pattern and configuration consistent with chronic demyelinating plaque associated with multiple sclerosis.  None of the foci enhances or appears to be acute.  However, 2 of the chronic foci on the current MRI were not apparent on the 2020 MRI and likely occurred during the interim.  The abnormal enhancement associated with several of the lesions in 2020 has resolved and some of these foci are reduced in size compared to the previous MRI.  Normal enhancement pattern.  No acute findings. ' MRI brain 12/03/2018:   Multiple T2/flair hyperintense foci predominantly in the periventricular white matter with some in the in the juxtacortical white matter.  A large focus in the right medial temporal lobe enhances after contrast.  MRI cervical spine 12/01/2018: Extensive T2 hyperintense signal within the spinal cord.  Posteriorly to the left at C2, medially at C2-C3, posteriorly to the right at C3 and towards the right at C3 (this focus enhances), medially and posteriorly at C4-C5, left greater than right, anteriorly  at C5-C6 (mild enhancement), medially at C6-C7.  MRI thoracic spine 12/01/2018: This study is degraded by motion.  Extensive foci throughout this thoracic spine with probable enhancement at T2-T3, T6-T7 and T8-T9 (difficult to be certain due to motion and seen enhancement only on axial not sagittal views)  MRI lumbar spine 10/27/2018: Degenerative changes towards the right at L4-L5 but no nerve root compression.  Abnormal signal within the visible thoracic spinal cord concerning for demyelination.   REVIEW OF SYSTEMS: Constitutional: No fevers, chills, sweats, or change in appetite Eyes: No visual changes, double vision, eye pain Ear, nose and throat: No hearing loss, ear pain, nasal congestion, sore throat Cardiovascular: No chest pain, palpitations Respiratory:  No shortness of breath at rest or with exertion.   No wheezes GastrointestinaI: No nausea, vomiting, diarrhea, abdominal pain, fecal incontinence Genitourinary:  No dysuria, urinary retention or frequency.  No nocturia. Musculoskeletal:  No neck pain, back pain Integumentary: No rash, pruritus, skin lesions Neurological: as above Psychiatric: No depression at this time.  No anxiety Endocrine: No palpitations, diaphoresis, change in appetite, change in weigh or increased thirst Hematologic/Lymphatic:  No anemia, purpura, petechiae. Allergic/Immunologic: No itchy/runny eyes, nasal congestion, recent allergic reactions, rashes  ALLERGIES: No Known Allergies  HOME MEDICATIONS:  Current Outpatient Medications:    Prenatal Vit-Fe Fumarate-FA (PRENATAL MULTIVITAMIN) TABS tablet, Take 1 tablet by mouth daily at 12 noon., Disp: , Rfl:    aspirin EC 81 MG tablet, Take 1 tablet (81 mg total) by mouth daily. Take after 12 weeks for prevention of preeclampsia later in pregnancy (Patient not taking: Reported on 06/30/2021), Disp: 300 tablet, Rfl: 2   cholecalciferol (VITAMIN D3) 25 MCG (1000 UNIT) tablet, Take 2,000 Units by mouth daily.  (Patient not taking: Reported on 06/30/2021), Disp: , Rfl:    metFORMIN (GLUCOPHAGE) 1000 MG tablet, 1000mg  by mouth with breakfast and just before bed (Patient not taking: Reported on 06/30/2021), Disp: 60 tablet, Rfl: 5   ondansetron (ZOFRAN ODT) 8 MG disintegrating tablet, Take 1 tablet (8 mg total) by mouth every 8 (eight) hours as needed for nausea or vomiting. (Patient not taking: Reported on 06/30/2021), Disp: 30 tablet, Rfl: 2   pantoprazole (PROTONIX) 20 MG tablet, Take 1 tablet (20 mg total) by mouth 2 (two) times daily before a meal. (Patient not taking: Reported on 06/30/2021), Disp: 60 tablet, Rfl: 5   promethazine (PHENERGAN) 25 MG tablet, Take 1 tablet (25 mg total) by mouth every 6 (six) hours as needed for nausea or vomiting. (Patient not taking: Reported on 06/30/2021), Disp: 30 tablet, Rfl: 1  PAST MEDICAL HISTORY: Past Medical History:  Diagnosis Date   Depression    GBS (group B Streptococcus carrier), +RV culture, currently pregnant 06/17/2016   Gestational diabetes    glyburide   Multiple sclerosis (HCC)    Nausea and vomiting during pregnancy 04/12/2021   Supervision of high risk pregnancy, antepartum 01/02/2016    Clinic  High Risk>transfer from  [facility] Prenatal Labs Dating LMP Blood type: O/Negative/-- (03/27 0000)  Genetic Screen 1 Screen: normal   AFP:      Antibody:Negative (03/27 0000) Anatomic [place] Bilateral choroid plexus cysts, all else wnl. [ ]  serial growth scans Rubella: Immune (03/27 0000) GTT Early:   213     RPR: Nonreactive (03/27 0000)  Flu vaccine  declined HBsAg: Negative (03/27 0000)  TDaP v    PAST SURGICAL HISTORY: Past Surgical History:  Procedure Laterality Date   NO PAST SURGERIES      FAMILY HISTORY: Family History  Problem Relation Age of Onset   Mental illness Mother        depression and OCD - sees psychiatrist   Multiple births Mother        pregnant with twins but lost them   Hypertension Father    Diabetes Father    Heart disease Father     Diabetes Brother    Thyroid disease Brother     SOCIAL HISTORY:  Social History   Socioeconomic History   Marital status: Married    Spouse name: Not on file   Number of children: Not on file   Years of education: Not on file   Highest education level: Not on file  Occupational History   Not on file  Tobacco Use   Smoking status: Never   Smokeless tobacco: Never  Vaping Use   Vaping Use: Never used  Substance and Sexual Activity   Alcohol use: No   Drug use: No   Sexual activity: Yes    Birth control/protection: None  Other Topics Concern   Not on file  Social History Narrative   Not on file   Social Determinants of Health   Financial Resource Strain: Not on file  Food Insecurity: No Food Insecurity   Worried About 10-11-1988 of Food in the Last Year: Never true   Ran Out of Food in the Last Year: Never true  Transportation Needs: No Transportation Needs   Lack of Transportation (Medical): No   Lack of Transportation (Non-Medical): No  Physical Activity: Not on file  Stress: Not on file  Social Connections: Not on file  Intimate Partner Violence: Not on file     PHYSICAL EXAM  Vitals:   06/30/21 1048  BP: 129/85  Weight: 246 lb (111.6 kg)  Height: 5\' 5"  (1.651 m)    Body mass index is 40.94 kg/m.   General: The patient is well-developed and well-nourished and in no acute distress.  She is [redacted] weeks pregnant today.  HEENT:  Head is [place]/AT.    Skin: Extremities are without rash or  edema.  Neurologic Exam  Mental status: The patient is alert and oriented x 3 at the time of the examination. The patient has apparent normal recent and remote memory, with an apparently normal attention span and concentration ability.   Speech is normal.  Cranial nerves: Extraocular movements are full.  There is good facial sensation to soft touch bilaterally.Facial strength is normal.  Trapezius and sternocleidomastoid strength is normal. No dysarthria is noted.      Hearing was symmetric  Motor:  Muscle bulk is normal.   Tone is normal. Strength is  5 / 5 in all 4 extremities.   Sensory: She reports reduced sensation to touch in the hands.  Normal sensation reported elsewhere.  Coordination: Cerebellar testing reveals good finger-nose-finger and heel-to-shin bilaterally.  Gait and station: Station is normal.   Gait is fairly normal.  The  tandem gait is slightly wide.  Romberg is negative.   Reflexes: Deep tendon reflexes are symmetric and normal bilaterally.  Marland Kitchen    DIAGNOSTIC DATA (LABS, IMAGING, TESTING) - I reviewed patient records, labs, notes, testing and imaging myself where available.  Lab Results  Component Value Date   WBC 8.4 05/26/2021   HGB 12.0 05/26/2021   HCT 37.9 05/26/2021   MCV 83 05/26/2021   PLT 180 05/26/2021      Component Value Date/Time   NA 131 (L) 05/07/2021 2058   NA 135 03/03/2021 0854   K 3.5 05/07/2021 2058   CL 102 05/07/2021 2058   CO2 19 (L) 05/07/2021 2058   GLUCOSE 91 05/07/2021 2058   BUN 5 (L) 05/07/2021 2058   BUN 3 (L) 03/03/2021 0854   CREATININE 0.53 05/07/2021 2058   CREATININE 0.49 (L) 02/29/2016 0001   CALCIUM 9.2 05/07/2021 2058   PROT 7.0 05/07/2021 2058   PROT 6.6 03/03/2021 0854   ALBUMIN 3.4 (L) 05/07/2021 2058   ALBUMIN 4.2 03/03/2021 0854   AST 15 05/07/2021 2058   ALT 15 05/07/2021 2058   ALKPHOS 57 05/07/2021 2058   BILITOT 0.6 05/07/2021 2058   BILITOT 0.4 03/03/2021 0854   GFRNONAA >60 05/07/2021 2058   GFRAA >60 01/03/2018 1830   No results found for: CHOL, HDL, LDLCALC, LDLDIRECT, TRIG, CHOLHDL Lab Results  Component Value Date   HGBA1C 6.1 (H) 02/24/2021   No results found for: VITAMINB12 Lab Results  Component Value Date   TSH 1.960 03/03/2021       ASSESSMENT AND PLAN  Multiple sclerosis (HCC) - Plan: Stratify JCV Antibody Test ([facility]), CBC with Differential/Platelet  Multiple sclerosis affecting pregnancy in third trimester (HCC)  Numbness  High  risk medication use - Plan: Stratify JCV Antibody Test ([facility]), CBC with Differential/Platelet  Language barrier   1.   She will continue off the Tysabri during the rest of the pregnancy.   Tysabri has been associated with hematologic abnormalities in newborns.  She plans on breast-feeding only a few weeks.  We did discuss that the amount of Tysabri that passes into breastmilk is small and that it should be safe to breast-feed.  We will try to get her back on Tysabri within a few weeks of delivery.  She signed a new informed consent form today.  We will check JCV antibody and CBC with differential.   2.     Around the time of the next visit we can recheck the JCV and CBC and also order an MRI of the brain to determine if there has been subclinical progression which might make [place] consider a different medication.   3.    Stay active and exercise as tolerated. 4.   She will return to see me in 6 months or sooner if there are new or worsening neurologic symptoms.     Saber Dickerman A. Epimenio Foot, MD, [facility] 06/30/2021, 12:05 PM Certified in Neurology, Clinical Neurophysiology, Sleep Medicine and Neuroimaging  [facility] [address] [place], [place] [address] [phone]

## 2021-07-01 ENCOUNTER — Ambulatory Visit: Payer: Self-pay | Admitting: Clinical

## 2021-07-01 ENCOUNTER — Other Ambulatory Visit: Payer: Self-pay

## 2021-07-01 DIAGNOSIS — F4323 Adjustment disorder with mixed anxiety and depressed mood: Secondary | ICD-10-CM

## 2021-07-01 LAB — CBC WITH DIFFERENTIAL/PLATELET
Basophils Absolute: 0 10*3/uL (ref 0.0–0.2)
Basos: 0 %
EOS (ABSOLUTE): 0 10*3/uL (ref 0.0–0.4)
Eos: 1 %
Hematocrit: 34.3 % (ref 34.0–46.6)
Hemoglobin: 11.1 g/dL (ref 11.1–15.9)
Immature Grans (Abs): 0 10*3/uL (ref 0.0–0.1)
Immature Granulocytes: 0 %
Lymphocytes Absolute: 1.4 10*3/uL (ref 0.7–3.1)
Lymphs: 21 %
MCH: 25.6 pg — ABNORMAL LOW (ref 26.6–33.0)
MCHC: 32.4 g/dL (ref 31.5–35.7)
MCV: 79 fL (ref 79–97)
Monocytes Absolute: 0.4 10*3/uL (ref 0.1–0.9)
Monocytes: 5 %
Neutrophils Absolute: 5 10*3/uL (ref 1.4–7.0)
Neutrophils: 73 %
Platelets: 163 10*3/uL (ref 150–450)
RBC: 4.34 x10E6/uL (ref 3.77–5.28)
RDW: 15.1 % (ref 11.7–15.4)
WBC: 6.8 10*3/uL (ref 3.4–10.8)

## 2021-07-01 NOTE — BH Specialist Note (Signed)
Integrated Behavioral Health Follow Up In-Person Visit  MRN: [id number] Name: [name] Li  No charge for this visit due to [facility] intern seeing patient.   Number of Integrated Behavioral Health Clinician visits: 3/6 Session Start time: 10:15am  Session End time: 11:06 Total time:  51  minutes  Types of Service: Individual psychotherapy  Interpretor:Yes.   Interpretor Name and Language: Deatra Ina #[id number] Spanish  Subjective: Shawnette Augello is a 40 y.o. female accompanied by  Self Patient was referred by Rosalita Levan for Adjustment Disorder with Mixed Anxiety and Depressed Mood. Patient reports the following symptoms/concerns: Anxiety from life stresses. The patient is also reporting not sleeping well, only 3 hours at a time.  Duration of problem: current pregnancy; Severity of problem: mild  Objective: Mood:  Normal  and Affect: Appropriate Risk of harm to self or others: No plan to harm self or others  Life Context: Family and Social: Patient disclosed her son has opportunity to go to [place] but patent has anxiety due to family next being apart as well as he will be going out of country for the first time.  School/Work: / Self-Care: Patient wants to focus on sleep for self-care to make sure the patient has more energy during this pregnancy.  Life Changes: No longer taking her MS medication with approval from neurologist.   Patient and/or Family's Strengths/Protective Factors: Social and Emotional competence and Parental Resilience  Goals Addressed: Patient will:  Reduce symptoms of: anxiety, insomnia, and stress   Increase knowledge and/or ability of: self-management skills and stress reduction   Demonstrate ability to: Increase healthy adjustment to current life circumstances and Increase motivation to adhere to plan of care  Progress towards Goals: Ongoing  Interventions: Interventions utilized:  Solution-Focused Strategies, Mindfulness or Teacher, early years/pre, CBT Cognitive Behavioral Therapy, Sleep Hygiene, and Psychoeducation and/or Health Education Standardized Assessments completed: Not Needed  Patient and/or Family Response: Patient agrees with ongoing treatment plan.   Patient Centered Plan: Patient is on the following Treatment Plan(s): IBH Assessment: Patient currently experiencing adjustment disorder with anxious mood.   Patient may benefit from continuing behavioral health services to maintain in positive mindset prior to birth.  Plan: Follow up with behavioral health clinician on : 10/20/222 at 10:15am Behavioral recommendations: self-care Referral(s): Integrated [employer] (In Clinic) "From scale of 1-10, how likely are you to follow plan?": /  Clelia Croft

## 2021-07-01 NOTE — Progress Notes (Signed)
I, Valetta Close Jackqueline Aquilar, LCSW, have reviewed all documentation for this visit. The documentation on 07/01/21 for the exam, diagnosis, procedures, and orders are all accurate and complete.

## 2021-07-07 ENCOUNTER — Ambulatory Visit: Payer: Self-pay | Attending: Obstetrics and Gynecology

## 2021-07-07 ENCOUNTER — Ambulatory Visit: Payer: No Typology Code available for payment source | Admitting: *Deleted

## 2021-07-07 ENCOUNTER — Encounter: Payer: Self-pay | Admitting: *Deleted

## 2021-07-07 ENCOUNTER — Other Ambulatory Visit: Payer: Self-pay

## 2021-07-07 VITALS — BP 123/70 | HR 90

## 2021-07-07 DIAGNOSIS — O0990 Supervision of high risk pregnancy, unspecified, unspecified trimester: Secondary | ICD-10-CM

## 2021-07-07 DIAGNOSIS — O09523 Supervision of elderly multigravida, third trimester: Secondary | ICD-10-CM

## 2021-07-07 DIAGNOSIS — E669 Obesity, unspecified: Secondary | ICD-10-CM

## 2021-07-07 DIAGNOSIS — Z3A33 33 weeks gestation of pregnancy: Secondary | ICD-10-CM

## 2021-07-07 DIAGNOSIS — O99213 Obesity complicating pregnancy, third trimester: Secondary | ICD-10-CM

## 2021-07-07 DIAGNOSIS — O99353 Diseases of the nervous system complicating pregnancy, third trimester: Secondary | ICD-10-CM

## 2021-07-07 DIAGNOSIS — O24415 Gestational diabetes mellitus in pregnancy, controlled by oral hypoglycemic drugs: Secondary | ICD-10-CM

## 2021-07-08 NOTE — Telephone Encounter (Signed)
Received JCV result. Antibody is negative and index value is 0.14.

## 2021-07-14 ENCOUNTER — Other Ambulatory Visit: Payer: Self-pay

## 2021-07-14 ENCOUNTER — Ambulatory Visit: Payer: Self-pay | Attending: Obstetrics and Gynecology

## 2021-07-14 ENCOUNTER — Other Ambulatory Visit: Payer: Self-pay | Admitting: *Deleted

## 2021-07-14 ENCOUNTER — Ambulatory Visit: Payer: No Typology Code available for payment source | Admitting: *Deleted

## 2021-07-14 ENCOUNTER — Encounter: Payer: Self-pay | Admitting: *Deleted

## 2021-07-14 VITALS — BP 125/75 | HR 86

## 2021-07-14 DIAGNOSIS — Z3A34 34 weeks gestation of pregnancy: Secondary | ICD-10-CM

## 2021-07-14 DIAGNOSIS — E669 Obesity, unspecified: Secondary | ICD-10-CM

## 2021-07-14 DIAGNOSIS — Z6839 Body mass index (BMI) 39.0-39.9, adult: Secondary | ICD-10-CM

## 2021-07-14 DIAGNOSIS — O24415 Gestational diabetes mellitus in pregnancy, controlled by oral hypoglycemic drugs: Secondary | ICD-10-CM | POA: Insufficient documentation

## 2021-07-14 DIAGNOSIS — O24419 Gestational diabetes mellitus in pregnancy, unspecified control: Secondary | ICD-10-CM

## 2021-07-14 DIAGNOSIS — O99213 Obesity complicating pregnancy, third trimester: Secondary | ICD-10-CM

## 2021-07-14 DIAGNOSIS — O358XX0 Maternal care for other (suspected) fetal abnormality and damage, not applicable or unspecified: Secondary | ICD-10-CM

## 2021-07-14 DIAGNOSIS — O09523 Supervision of elderly multigravida, third trimester: Secondary | ICD-10-CM

## 2021-07-14 DIAGNOSIS — O0990 Supervision of high risk pregnancy, unspecified, unspecified trimester: Secondary | ICD-10-CM | POA: Insufficient documentation

## 2021-07-15 ENCOUNTER — Ambulatory Visit: Payer: Self-pay | Admitting: Clinical

## 2021-07-15 DIAGNOSIS — F4323 Adjustment disorder with mixed anxiety and depressed mood: Secondary | ICD-10-CM

## 2021-07-15 NOTE — BH Specialist Note (Deleted)
the patient.

## 2021-07-15 NOTE — BH Specialist Note (Signed)
Pt did not show up for 10:15 appointment. Intern attempted to call pt with interrupter Edwardo [phone] present, but no answer and voicemail was not set--up.

## 2021-07-15 NOTE — Progress Notes (Signed)
I, Valetta Close Kirti Carl, LCSW, have reviewed all documentation for this visit. The documentation on 07/15/21 for the exam, diagnosis, procedures, and orders are all accurate and complete.

## 2021-07-16 ENCOUNTER — Ambulatory Visit (INDEPENDENT_AMBULATORY_CARE_PROVIDER_SITE_OTHER): Payer: Self-pay | Admitting: Obstetrics & Gynecology

## 2021-07-16 ENCOUNTER — Other Ambulatory Visit: Payer: Self-pay

## 2021-07-16 VITALS — BP 138/89 | HR 97 | Wt 247.0 lb

## 2021-07-16 DIAGNOSIS — O0990 Supervision of high risk pregnancy, unspecified, unspecified trimester: Secondary | ICD-10-CM

## 2021-07-16 DIAGNOSIS — O24419 Gestational diabetes mellitus in pregnancy, unspecified control: Secondary | ICD-10-CM

## 2021-07-16 DIAGNOSIS — O09523 Supervision of elderly multigravida, third trimester: Secondary | ICD-10-CM

## 2021-07-16 DIAGNOSIS — Z3A34 34 weeks gestation of pregnancy: Secondary | ICD-10-CM

## 2021-07-16 NOTE — Progress Notes (Signed)
PRENATAL VISIT NOTE  Subjective:  [name] Li is a 40 y.o. G4P3003 at [redacted]w[redacted]d being seen today for ongoing prenatal care. Patient is Spanish-speaking only, interpreter present for this encounter.  She is currently monitored for the following issues for this high-risk pregnancy and has Rh negative, antepartum; Obesity affecting pregnancy, antepartum; Multiple sclerosis (HCC); Numbness; Ataxic gait; High risk medication use; Supervision of high risk pregnancy, antepartum; Advanced maternal age in multigravida; GDM, class A2; BMI 40.0-44.9, adult (HCC); Language barrier; Multiple sclerosis affecting pregnancy in third trimester (HCC); LGA (large for gestational age) fetus affecting management of mother; and Anxiety and depression on their problem list.  Patient reports  occasional pain when baby is moving .  Contractions: Not present. Vag. Bleeding: None.  Movement: Present. Denies leaking of fluid.   The following portions of the patient's history were reviewed and updated as appropriate: allergies, current medications, past family history, past medical history, past social history, past surgical history and problem list.   Objective:   Vitals:   07/16/21 1023  BP: 138/89  Pulse: 97  Weight: 247 lb (112 kg)    Fetal Status: Fetal Heart Rate (bpm): 144   Movement: Present     General:  Alert, oriented and cooperative. Patient is in no acute distress.  Skin: Skin is warm and dry. No rash noted.   Cardiovascular: Normal heart rate noted  Respiratory: Normal respiratory effort, no problems with respiration noted  Abdomen: Soft, gravid, appropriate for gestational age.  Pain/Pressure: Present     Pelvic: Cervical exam deferred        Extremities: Normal range of motion.  Edema: None  Mental Status: Normal mood and affect. Normal behavior. Normal judgment and thought content.   Imaging: [place] MFM FETAL BPP WO NON STRESS  Result Date:  07/14/2021 ----------------------------------------------------------------------  OBSTETRICS REPORT                       (Signed Final 07/14/2021 02:11 pm) ---------------------------------------------------------------------- Patient Info  ID #:       [id number]                          D.O.B.:  04-Oct-1980 (40 yrs)  Name:       [name] Maw-                Visit Date: 07/14/2021 10:13 am              Li ---------------------------------------------------------------------- Performed By  Attending:        Noralee Space MD        Ref. Address:     [facility]                                                             [facility]                                                             Healthcare  Performed By:     Clayton Lefort RDMS       Location[facility]  [facility]                                                             [facility]  Referred By:      Warden Fillers MD ---------------------------------------------------------------------- Orders  #  Description                           Code        Ordered By  1  [place] MFM FETAL BPP WO NON               76819.01    RAVI SHANKAR     STRESS  2  [place] MFM OB FOLLOW UP                   76816.01    RAVI [facility] ----------------------------------------------------------------------  #  Order #                     Accession #                Episode #  1  [id number]                   [id number]                 [id number]  2  [id number]                   [id number]                 [id number] ---------------------------------------------------------------------- Indications  Gestational diabetes in pregnancy,             O24.415  controlled by oral hypoglycemic drugs  (metformin)  [redacted] weeks gestation of pregnancy                Z3A.22  Advanced maternal age multigravida 57+,        O46.523  third trimester   Diseases of the nervous system                 O99.353  complicating pregnancy, third trimester  Obesity complicating pregnancy, third          O99.213  trimester (pregravid BMI 39)  Fetal choroid plexus cyst (resolved)           O35.8XX0 ---------------------------------------------------------------------- Fetal Evaluation  Num Of Fetuses:  1  Fetal Heart Rate(bpm):  152  Cardiac Activity:       Observed  Presentation:           Cephalic  Placenta:               Anterior  P. Cord Insertion:      Previously Visualized  Amniotic Fluid  AFI FV:      Within normal limits  AFI Sum(cm)     %Tile       Largest Pocket(cm)  18.67           69          5.87  RUQ(cm)       RLQ(cm)       LUQ(cm)        LLQ(cm)  3.41          4.17          5.22           5.87 ---------------------------------------------------------------------- Biophysical Evaluation  Amniotic F.V:   Within normal limits       F. Tone:        Observed  F. Movement:    Observed                   Score:          8/8  F. Breathing:   Observed ---------------------------------------------------------------------- Biometry  BPD:      90.2  mm     G. Age:  36w 4d         96  %    CI:        75.78   %    70 - 86                                                          FL/HC:      20.4   %    19.4 - 21.8  HC:      328.5  mm     G. Age:  37w 2d         89  %    HC/AC:      1.07        0.96 - 1.11  AC:      305.8  mm     G. Age:  34w 4d         66  %    FL/BPD:     74.2   %    71 - 87  FL:       66.9  mm     G. Age:  34w 3d         48  %    FL/AC:      21.9   %    20 - 24  Est. FW:    2565  gm    5 lb 10 oz      70  % ---------------------------------------------------------------------- OB History  Gravidity:    4         Term:   3  Living:       3 ---------------------------------------------------------------------- Gestational Age  LMP:           31w 4d        Date:  12/05/20  EDD:   09/11/21  U/S Today:     35w 5d                                         EDD:   08/13/21  Best:          34w 1d     Det. By:  Previous Ultrasound      EDD:   08/24/21                                      (04/12/21) ---------------------------------------------------------------------- Anatomy  Cranium:               Appears normal         Aortic Arch:            Previously seen  Cavum:                 Previously seen        Ductal Arch:            Previously seen  Ventricles:            Appears normal         Diaphragm:              Previously seen  Choroid Plexus:        Previously seen        Stomach:                Appears normal, left                                                                        sided  Cerebellum:            Previously seen        Abdomen:                Previously seen  Posterior Fossa:       Previously seen        Abdominal Wall:         Previously seen  Nuchal Fold:           Not applicable (>20    Cord Vessels:           Previously seen                         wks GA)  Face:                  Orbits and profile     Kidneys:                Appear normal                         previously seen  Lips:                  Previously seen        Bladder:  Appears normal  Thoracic:              Previously seen        Spine:                  Previously seen  Heart:                 Appears normal         Upper Extremities:      Previously seen                         (4CH, axis, and                         situs)  RVOT:                  Previously seen        Lower Extremities:      Previously seen  LVOT:                  Previously seen  Other:  Fetus-female  Right choroid plexus cyst resolved. Nasal bone and          lenses previously visualized. Heels/feet and open hands previously          visualized. Technically difficult due to maternal habitus ---------------------------------------------------------------------- Cervix Uterus Adnexa  Cervix  Not visualized (advanced GA >24wks)  Right Ovary  Not visualized.  Left Ovary  Not visualized.  ---------------------------------------------------------------------- Impression  Patient returned for fetal growth assessment and antenatal  testing.  She has gestational diabetes and takes metformin  1000 mg twice daily.  I reviewed her blood glucose log and  they are within normal range over the last 5 days.  Fetal growth is appropriate for gestational age .Amniotic fluid  is normal and good fetal activity is seen .Antenatal testing is  reassuring. BPP 8/8.  I reassured the patient of the findings. ---------------------------------------------------------------------- Recommendations  -Continue weekly BPP till delivery. ----------------------------------------------------------------------                  Noralee Space, MD Electronically Signed Final Report   07/14/2021 02:11 pm ----------------------------------------------------------------------  [place] MFM FETAL BPP WO NON STRESS  Result Date: 07/07/2021 ----------------------------------------------------------------------  OBSTETRICS REPORT                       (Signed Final 07/07/2021 01:47 pm) ---------------------------------------------------------------------- Patient Info  ID #:       [id number]                          D.O.B.:  July 19, 1981 (40 yrs)  Name:       [name] Maw-                Visit Date: 07/07/2021 12:52 pm              Li ---------------------------------------------------------------------- Performed By  Attending:        Noralee Space MD        Ref. Address[facility]     [facility]  Performed By:     Earley Brooke     Location:         [facility]                    BS, RDMS                                 [facility]                                                             [facility]  Referred By:      Warden Fillers MD ---------------------------------------------------------------------- Orders  #  Description                           Code        Ordered By  1  [place] MFM FETAL BPP WO NON               76819.01    RAVI [facility]     STRESS ----------------------------------------------------------------------  #  Order #                     Accession #                Episode #  1  [id number]                   [id number]                 [id number] ---------------------------------------------------------------------- Indications  Gestational diabetes in pregnancy,             O24.415  controlled by oral hypoglycemic drugs  (metformin)  [redacted] weeks gestation of pregnancy                Z3A.37  Advanced maternal age multigravida 92+,        O61.523  third trimester  Diseases of the nervous system                 O99.353  complicating pregnancy, third trimester  Obesity complicating pregnancy, third          O99.213  trimester (pregravid BMI 39)  Fetal choroid plexus cyst (resolved)           O35.8XX0 ---------------------------------------------------------------------- Fetal Evaluation  Num Of Fetuses:  1  Fetal Heart Rate(bpm):  149  Cardiac Activity:       Observed  Presentation:           Breech  Placenta:               Anterior  Amniotic Fluid  AFI FV:      Within normal limits  AFI Sum(cm)     %Tile       Largest Pocket(cm)  16.4            59          6.2  RUQ(cm)       RLQ(cm)       LUQ(cm)        LLQ(cm)  6.2           0             6.1            4.1 ---------------------------------------------------------------------- Biophysical Evaluation  Amniotic F.V:   Within normal limits       F. Tone:        Observed  F. Movement:    Observed                   Score:          8/8  F. Breathing:   Observed ---------------------------------------------------------------------- OB History  Gravidity:    4         Term:   3  Living:       3 ----------------------------------------------------------------------  Gestational Age  LMP:           30w 4d        Date:  12/05/20                 EDD:   09/11/21  Best:          33w 1d     Det. By:  Previous Ultrasound      EDD:   08/24/21                                      (04/12/21) ---------------------------------------------------------------------- Impression  Amniotic fluid is normal and good fetal activity is seen  .Antenatal testing is reassuring. BPP 8/8. ---------------------------------------------------------------------- Recommendations  -Continue weekly BPP till delivery. ----------------------------------------------------------------------                  Noralee Space, MD Electronically Signed Final Report   07/07/2021 01:47 pm ----------------------------------------------------------------------  [place] MFM FETAL BPP WO NON STRESS  Result Date: 06/29/2021 ----------------------------------------------------------------------  OBSTETRICS REPORT                       (Signed Final 06/29/2021 10:08 am) ---------------------------------------------------------------------- Patient Info  ID #:       [id number]                          D.O.B.:  [birth year] (40 yrs)  Name:       [name] REBMAN-                Visit Date: 06/29/2021 09:36 am              Li ---------------------------------------------------------------------- Performed By  Attending:        Lin Landsman      Ref. Address[facility]     [facility]  Performed By:     Eden Lathe BS      Location:         [facility]                    RDMS RVT                                 [facility]                                                             [facility]  Referred By:      Warden Fillers MD  ---------------------------------------------------------------------- Orders  #  Description                           Code        Ordered By  1  [place] MFM FETAL BPP WO NON               76819.01    [facility]     STRESS ----------------------------------------------------------------------  #  Order #                     Accession #                Episode #  1  [id number]                   [id number]                 [id number] ---------------------------------------------------------------------- Indications  [redacted] weeks gestation of pregnancy                Z3A.32  Gestational diabetes in pregnancy,             O24.415  controlled by oral hypoglycemic drugs  (metformin)  Advanced maternal age multigravida 39+,        O65.523  third trimester  Diseases of the nervous system  O99.353  complicating pregnancy, third trimester  Obesity complicating pregnancy, third          O99.213  trimester (pregravid BMI 39)  Fetal choroid plexus cyst (resolved)           O35.8XX0 ---------------------------------------------------------------------- Fetal Evaluation  Num Of Fetuses:         1  Fetal Heart Rate(bpm):  147  Cardiac Activity:       Observed  Presentation:           Breech  Amniotic Fluid  AFI FV:      Within normal limits  AFI Sum(cm)     %Tile       Largest Pocket(cm)  19.2            72          5.4  RUQ(cm)       RLQ(cm)       LUQ(cm)        LLQ(cm)  5.4           5.4           3.5            4.9 ---------------------------------------------------------------------- Biophysical Evaluation  Amniotic F.V:   Within normal limits       F. Tone:        Observed  F. Movement:    Observed                   Score:          8/8  F. Breathing:   Observed ---------------------------------------------------------------------- OB History  Gravidity:    4         Term:   3  Living:       3 ---------------------------------------------------------------------- Gestational Age  LMP:           29w 3d        Date:   12/05/20                 EDD:   09/11/21  Best:          Armida Sans 0d     Det. By:  Previous Ultrasound      EDD:   08/24/21                                      (04/12/21) ---------------------------------------------------------------------- Impression  Antenatal testing performed given maternal A2GDM  The biophysical profile was 8/8 with good fetal movement and  amniotic fluid volume. ---------------------------------------------------------------------- Recommendations  Continue weekly testing  Growth previously scheduled. ----------------------------------------------------------------------               Lin Landsman, MD Electronically Signed Final Report   06/29/2021 10:08 am ----------------------------------------------------------------------  [place] MFM OB FOLLOW UP  Result Date: 07/14/2021 ----------------------------------------------------------------------  OBSTETRICS REPORT                       (Signed Final 07/14/2021 02:11 pm) ---------------------------------------------------------------------- Patient Info  ID #:       [id number]                          D.O.B.:  Jan 29, 1981 (40 yrs)  Name:       [name] BILSKI-                Visit Date: 07/14/2021 10:13 am  Li ---------------------------------------------------------------------- Performed By  Attending:        Noralee Space MD        Ref. Address:     [facility]                                                             [facility]                                                             Healthcare  Performed By:     Clayton Lefort RDMS       Location:         [facility]                                                             [facility]  Referred By:      Warden Fillers MD ---------------------------------------------------------------------- Orders  #   Description                           Code        Ordered By  1  [place] MFM FETAL BPP WO NON               76819.01    RAVI SHANKAR     STRESS  2  [place] MFM OB FOLLOW UP                   51025.85    RAVI [facility] ----------------------------------------------------------------------  #  Order #                     Accession #                Episode #  1  [id number]                   [id number]                 [id number]  2  [id number]  [id number]                 [id number] ---------------------------------------------------------------------- Indications  Gestational diabetes in pregnancy,             O24.415  controlled by oral hypoglycemic drugs  (metformin)  [redacted] weeks gestation of pregnancy                Z3A.38  Advanced maternal age multigravida 74+,        O29.523  third trimester  Diseases of the nervous system                 O99.353  complicating pregnancy, third trimester  Obesity complicating pregnancy, third          O99.213  trimester (pregravid BMI 39)  Fetal choroid plexus cyst (resolved)           O35.8XX0 ---------------------------------------------------------------------- Fetal Evaluation  Num Of Fetuses:         1  Fetal Heart Rate(bpm):  152  Cardiac Activity:       Observed  Presentation:           Cephalic  Placenta:               Anterior  P. Cord Insertion:      Previously Visualized  Amniotic Fluid  AFI FV:      Within normal limits  AFI Sum(cm)     %Tile       Largest Pocket(cm)  18.67           69          5.87  RUQ(cm)       RLQ(cm)       LUQ(cm)        LLQ(cm)  3.41          4.17          5.22           5.87 ---------------------------------------------------------------------- Biophysical Evaluation  Amniotic F.V:   Within normal limits       F. Tone:        Observed  F. Movement:    Observed                   Score:          8/8  F. Breathing:   Observed ---------------------------------------------------------------------- Biometry  BPD:      90.2  mm     G. Age:  36w 4d          96  %    CI:        75.78   %    70 - 86                                                          FL/HC:      20.4   %    19.4 - 21.8  HC:      328.5  mm     G. Age:  37w 2d         89  %    HC/AC:      1.07        0.96 - 1.11  AC:      305.8  mm     G. Age:  34w 4d  66  %    FL/BPD:     74.2   %    71 - 87  FL:       66.9  mm     G. Age:  34w 3d         48  %    FL/AC:      21.9   %    20 - 24  Est. FW:    2565  gm    5 lb 10 oz      70  % ---------------------------------------------------------------------- OB History  Gravidity:    4         Term:   3  Living:       3 ---------------------------------------------------------------------- Gestational Age  LMP:           31w 4d        Date:  12/05/20                 EDD:   09/11/21  U/S Today:     35w 5d                                        EDD:   08/13/21  Best:          34w 1d     Det. By:  Previous Ultrasound      EDD:   08/24/21                                      (04/12/21) ---------------------------------------------------------------------- Anatomy  Cranium:               Appears normal         Aortic Arch:            Previously seen  Cavum:                 Previously seen        Ductal Arch:            Previously seen  Ventricles:            Appears normal         Diaphragm:              Previously seen  Choroid Plexus:        Previously seen        Stomach:                Appears normal, left                                                                        sided  Cerebellum:            Previously seen        Abdomen:                Previously seen  Posterior Fossa:       Previously seen        Abdominal Wall:         Previously seen  Nuchal Fold:  Not applicable (>20    Cord Vessels:           Previously seen                         wks GA)  Face:                  Orbits and profile     Kidneys:                Appear normal                         previously seen  Lips:                  Previously seen        Bladder:                 Appears normal  Thoracic:              Previously seen        Spine:                  Previously seen  Heart:                 Appears normal         Upper Extremities:      Previously seen                         (4CH, axis, and                         situs)  RVOT:                  Previously seen        Lower Extremities:      Previously seen  LVOT:                  Previously seen  Other:  Fetus-female  Right choroid plexus cyst resolved. Nasal bone and          lenses previously visualized. Heels/feet and open hands previously          visualized. Technically difficult due to maternal habitus ---------------------------------------------------------------------- Cervix Uterus Adnexa  Cervix  Not visualized (advanced GA >24wks)  Right Ovary  Not visualized.  Left Ovary  Not visualized. ---------------------------------------------------------------------- Impression  Patient returned for fetal growth assessment and antenatal  testing.  She has gestational diabetes and takes metformin  1000 mg twice daily.  I reviewed her blood glucose log and  they are within normal range over the last 5 days.  Fetal growth is appropriate for gestational age .Amniotic fluid  is normal and good fetal activity is seen .Antenatal testing is  reassuring. BPP 8/8.  I reassured the patient of the findings. ---------------------------------------------------------------------- Recommendations  -Continue weekly BPP till delivery. ----------------------------------------------------------------------                  Noralee Space, MD Electronically Signed Final Report   07/14/2021 02:11 pm ----------------------------------------------------------------------   Assessment and Plan:  Pregnancy: [id number] at [redacted]w[redacted]d 1. GDM, class A2 Blood sugars reviewed with patient. Continue Metformin 1000 mg po bid. Continue scans and antenatal testing as per MFM.  2. [redacted] weeks gestation of pregnancy 3. Multigravida of advanced maternal age in  third trimester 4. Supervision of high risk pregnancy, antepartum No  other significant concerns.  Preterm labor symptoms and general obstetric precautions including but not limited to vaginal bleeding, contractions, leaking of fluid and fetal movement were reviewed in detail with the patient. Please refer to After Visit Summary for other counseling recommendations.   Return in about 1 week (around 07/23/2021) for OFFICE OB VISIT (MD only).  Future Appointments  Date Time Provider Department Center  07/22/2021  9:15 AM [facility]  07/22/2021 10:35 AM Adam Phenix, MD [facility]  07/29/2021  9:15 AM [facility] [facility]  08/05/2021  9:15 AM Vianny Schraeder, Jethro Bastos, MD [facility]  08/05/2021 10:15 AM [facility] NST [facility]  08/12/2021 11:15 AM [facility] NURSE [facility]  08/12/2021 11:30 AM [facility] US3 [facility]US [facility]  01/05/2022 11:00 AM [name], [name], NP GNA[facility]GNA None    Jaynie Collins, MD

## 2021-07-16 NOTE — Patient Instructions (Signed)
Prevencin del parto prematuro Preventing Preterm Birth Se conoce como parto prematuro al nacimiento del beb entre las semanas 20 y 37 del Psychiatrist. Un embarazo a trmino comprende un mnimo de 37 semanas. El parto prematuro puede aumentar el riesgo de complicaciones para el beb porque no ha madurado completamente antes de Psychologist, clinical. Cmo puede afectar al beb el parto prematuro? Las complicaciones del parto prematuro pueden incluir las siguientes: Problemas respiratorios. Problemas de visin o audicin. Dificultad para alimentarse. Infecciones o inflamacin del tubo digestivo (colitis). [employer] al nacer o muy bajo peso al nacer. Dao cerebral que causa retrasos en el desarrollo y discapacidades de aprendizaje, y que afecta al movimiento y la coordinacin (parlisis cerebral). Mayor riesgo de padecer diabetes, enfermedades cardacas y presin arterial alta en el futuro. Qu puede aumentar mi riesgo de tener un parto prematuro? Antecedentes mdicos No se conoce la causa exacta de los partos prematuros. Los siguientes factores pueden hacerla ms propensa a tener un parto prematuro: Recibir un diagnstico de placenta previa. Esta es una afeccin en la que la placenta cubre la parte inferior del tero (cuello uterino), que se abre hacia la vagina. Ciertas afecciones del Psychiatrist actual y de Proofreader, como: Haber tenido un parto prematuro antes. Estar embarazada de ms de un beb. Tener embarazos seguidos con menos de 18 meses de diferencia. Ciertas anormalidades en el beb en gestacin. Hemorragia vaginal durante el embarazo. Quedar embarazada a travs de fertilizacin in vitro (FIV). Tener sobrepeso o [place]. Antecedentes mdicos de lo siguiente: ITS (infecciones de transmisin sexual) u otras infecciones en las vas urinarias y la vagina. Enfermedades a Air cabin crew (crnicas), como problemas de coagulacin sangunea, diabetes o hipertensin arterial. Cuello uterino  corto. Factores relacionados con el ambiente y el estilo de vida Consumir drogas o productos que contengan tabaco. Consumo de alcohol. Tener estrs y no contar con apoyo social. Violencia en el hogar (violencia domstica). Estar expuesta a ciertas sustancias qumicas o contaminantes del ambiente. Qu medidas puedo tomar para evitar un parto prematuro? Atencin mdica Lo ms importante que puede hacer para disminuir el riesgo de Warehouse manager un parto prematuro es Barista atencin mdica de rutina durante el Psychiatrist (cuidado prenatal). Concurra a todas las visitas de seguimiento. Esto es importante. Si corre un riesgo alto de Warehouse manager un parto prematuro: La podran derivar a un mdico que se especialice en el control de embarazos de Conservator, museum/gallery (perinatlogo). Tal vez le receten medicamentos para ayudar a Ship broker. Estilo de vida Ciertos cambios en el estilo de vida tambin pueden disminuir el riesgo de tener un parto prematuro: Espere al menos 6 meses despus de un embarazo para volver a Scientist, research (physical sciences). Antes de Kerry Kass, logre un peso saludable. Si tiene sobrepeso, trabaje con el mdico para adelgazar sin riegos. No consuma ningn producto que contenga nicotina o tabaco. Estos productos incluyen cigarrillos, tabaco para Theatre manager y aparatos de vapeo, como los Administrator, Civil Service. Si necesita ayuda para dejar de fumar, consulte al mdico. No beba alcohol. No consuma drogas. Siga una dieta saludable. Controle otros problemas mdicos que tenga, como por ejemplo, diabetes o presin arterial alta. Dnde buscar apoyo Para obtener ms ayuda, considere lo siguiente: Hablar con el mdico. Hablar con un terapeuta o un asesor de consumo de drogas si necesita ayuda para dejar de hacerlo. Trabajar con un nutricionista o un entrenador fsico para Technical brewer peso saludable. Unirse a un grupo de apoyo. Dnde buscar ms informacin Obtenga ms informacin sobre cmo prevenir un  Psychiatrist  prematuro en los siguientes sitios: Centers for Disease Control and Prevention (Centros para el Control y la Prevencin de Enfermedades): cdc.gov March of Dimes: marchofdimes.org American Pregnancy Association (Asociacin Estadounidense del Embarazo): americanpregnancy.org Comunquese con un mdico si: Tiene cualquiera de estos sntomas de trabajo de parto prematuro antes de las 37 semanas: Cambio o aumento de la secrecin vaginal. Prdida de lquido por la vagina. Presin o calambres en la parte inferior del abdomen. Dolor de espalda que no se calma o empeora. Endurecimiento regular (contracciones) en la parte inferior del abdomen. Solicite ayuda de inmediato si: Tiene contracciones dolorosas y regulares cada 5 minutos o menos. Rompe la bolsa. Resumen Parto prematuro significa tener al beb durante las semanas 20 a 37 del embarazo. El parto prematuro puede poner al beb en riesgo de complicaciones de salud. No se conoce la causa exacta de los partos prematuros. Obtener un buen cuidado prenatal de rutina puede ayudar a prevenir el parto prematuro. Concurra a todas las visitas de seguimiento. Esto es importante. Comunquese con un mdico si tiene sntomas de trabajo de parto prematuro. Esta informacin no tiene como fin reemplazar el consejo del mdico. Asegrese de hacerle al mdico cualquier pregunta que tenga. Document Revised: 10/02/2020 Document Reviewed: 10/02/2020 Elsevier Patient Education  2022 Elsevier Inc.  

## 2021-07-22 ENCOUNTER — Telehealth (INDEPENDENT_AMBULATORY_CARE_PROVIDER_SITE_OTHER): Payer: Self-pay | Admitting: Obstetrics & Gynecology

## 2021-07-22 ENCOUNTER — Other Ambulatory Visit: Payer: No Typology Code available for payment source

## 2021-07-22 ENCOUNTER — Telehealth: Payer: Self-pay | Admitting: Family Medicine

## 2021-07-22 DIAGNOSIS — O0990 Supervision of high risk pregnancy, unspecified, unspecified trimester: Secondary | ICD-10-CM

## 2021-07-22 DIAGNOSIS — O24419 Gestational diabetes mellitus in pregnancy, unspecified control: Secondary | ICD-10-CM

## 2021-07-22 DIAGNOSIS — O219 Vomiting of pregnancy, unspecified: Secondary | ICD-10-CM

## 2021-07-22 DIAGNOSIS — O09523 Supervision of elderly multigravida, third trimester: Secondary | ICD-10-CM

## 2021-07-22 DIAGNOSIS — Z3A35 35 weeks gestation of pregnancy: Secondary | ICD-10-CM

## 2021-07-22 MED ORDER — PROMETHAZINE HCL 25 MG PO TABS: 25.0000 mg | ORAL_TABLET | Freq: Four times a day (QID) | ORAL | 1 refills | Status: DC | PRN

## 2021-07-22 NOTE — Telephone Encounter (Signed)
Patient called in and spoke to [facility] (interpreter) state she can not come in today due to being sick, I changed her provider appointment to virtual so she wouldn't miss that appointment.

## 2021-07-22 NOTE — Progress Notes (Signed)
Patient complains of chest pains and stated that she thinks she has "a cold'

## 2021-07-22 NOTE — Progress Notes (Signed)
TELEHEALTH OBSTETRICS VISIT ENCOUNTER NOTE  Provider location: [facility] for [facility]   Patient location: Home  I connected with [name] Li on 07/22/21 at 10:35 AM EDT by telephone at home and verified that I am speaking with the correct person using two identifiers. Of note, unable to do video encounter due to technical difficulties.    I discussed the limitations, risks, security and privacy concerns of performing an evaluation and management service by telephone and the availability of in person appointments. I also discussed with the patient that there may be a patient responsible charge related to this service. The patient expressed understanding and agreed to proceed.  Subjective:  [name] Li is a 40 y.o. G4P3003 at [redacted]w[redacted]d being followed for ongoing prenatal care.  She is currently monitored for the following issues for this high-risk pregnancy and has Rh negative, antepartum; Obesity affecting pregnancy, antepartum; Multiple sclerosis (HCC); Numbness; Ataxic gait; High risk medication use; Supervision of high risk pregnancy, antepartum; Advanced maternal age in multigravida; GDM, class A2; BMI 40.0-44.9, adult (HCC); Language barrier; Multiple sclerosis affecting pregnancy in third trimester (HCC); LGA (large for gestational age) fetus affecting management of mother; and Anxiety and depression on their problem list.  Patient reports nausea and sore throat, fever cough and congestion . Reports fetal movement. Denies any contractions, bleeding or leaking of fluid.   The following portions of the patient's history were reviewed and updated as appropriate: allergies, current medications, past family history, past medical history, past social history, past surgical history and problem list.   Objective:  Last menstrual period 12/05/2020, unknown if currently breastfeeding. General:  Alert, oriented and cooperative.   Mental Status:  Normal mood and affect perceived. Normal judgment and thought content.  Rest of physical exam deferred due to type of encounter  Assessment and Plan:  Pregnancy: G4P3003 at [redacted]w[redacted]d 1. Supervision of high risk pregnancy, antepartum Flu-like symptoms, recommend Covid test, rest at home, Theraflu is ok to use and Tylenol prn  2. Multigravida of advanced maternal age in third trimester   3. Nausea and vomiting during pregnancy 4. GDM good control on metformin needs weekly antenatal testing - promethazine (PHENERGAN) 25 MG tablet; Take 1 tablet (25 mg total) by mouth every 6 (six) hours as needed for nausea or vomiting.  Dispense: 30 tablet; Refill: 1  Preterm labor symptoms and general obstetric precautions including but not limited to vaginal bleeding, contractions, leaking of fluid and fetal movement were reviewed in detail with the patient.  I discussed the assessment and treatment plan with the patient. The patient was provided an opportunity to ask questions and all were answered. The patient agreed with the plan and demonstrated an understanding of the instructions. The patient was advised to call back or seek an in-person office evaluation/go to MAU at [facility] for any urgent or concerning symptoms. Please refer to After Visit Summary for other counseling recommendations.   I provided 15 minutes of non-face-to-face time during this encounter. Eda Royal interpreted during encounter Return in about 1 week (around 07/29/2021) for weekly BPP.  Future Appointments  Date Time Provider Department Center  07/29/2021  9:15 AM [facility] NST [facility]  08/05/2021  9:15 AM Anyanwu, Jethro Bastos, MD [facility]  08/05/2021 10:15 AM [facility]A NST [facility]  08/12/2021 11:15 AM W[facility]MFC NURSE [facility]  08/12/2021 11:30 AM W[facility]MFC US3 [facility]  01/05/2022 11:00 AM [name], [name], NP G[facility]GNA None    Scheryl Darter, MD  [facility] for [place]

## 2021-07-23 ENCOUNTER — Other Ambulatory Visit: Payer: Self-pay

## 2021-07-23 ENCOUNTER — Encounter (HOSPITAL_COMMUNITY): Payer: Self-pay | Admitting: Obstetrics & Gynecology

## 2021-07-23 ENCOUNTER — Inpatient Hospital Stay (HOSPITAL_COMMUNITY)
Admission: AD | Admit: 2021-07-23 | Discharge: 2021-07-23 | Disposition: A | Discharge status: Home / Self Care | Payer: No Typology Code available for payment source | Attending: Obstetrics & Gynecology | Admitting: Obstetrics & Gynecology

## 2021-07-23 DIAGNOSIS — O99513 Diseases of the respiratory system complicating pregnancy, third trimester: Secondary | ICD-10-CM

## 2021-07-23 DIAGNOSIS — J101 Influenza due to other identified influenza virus with other respiratory manifestations: Secondary | ICD-10-CM | POA: Insufficient documentation

## 2021-07-23 DIAGNOSIS — O1493 Unspecified pre-eclampsia, third trimester: Secondary | ICD-10-CM | POA: Insufficient documentation

## 2021-07-23 DIAGNOSIS — O24913 Unspecified diabetes mellitus in pregnancy, third trimester: Secondary | ICD-10-CM | POA: Insufficient documentation

## 2021-07-23 DIAGNOSIS — O1403 Mild to moderate pre-eclampsia, third trimester: Secondary | ICD-10-CM

## 2021-07-23 DIAGNOSIS — Z20822 Contact with and (suspected) exposure to covid-19: Secondary | ICD-10-CM | POA: Insufficient documentation

## 2021-07-23 DIAGNOSIS — O98513 Other viral diseases complicating pregnancy, third trimester: Secondary | ICD-10-CM | POA: Insufficient documentation

## 2021-07-23 DIAGNOSIS — Z79899 Other long term (current) drug therapy: Secondary | ICD-10-CM | POA: Insufficient documentation

## 2021-07-23 DIAGNOSIS — O1490 Unspecified pre-eclampsia, unspecified trimester: Secondary | ICD-10-CM | POA: Diagnosis not present

## 2021-07-23 DIAGNOSIS — Z8249 Family history of ischemic heart disease and other diseases of the circulatory system: Secondary | ICD-10-CM | POA: Insufficient documentation

## 2021-07-23 DIAGNOSIS — Z3A35 35 weeks gestation of pregnancy: Secondary | ICD-10-CM | POA: Insufficient documentation

## 2021-07-23 DIAGNOSIS — Z7984 Long term (current) use of oral hypoglycemic drugs: Secondary | ICD-10-CM | POA: Insufficient documentation

## 2021-07-23 LAB — CBC
HCT: 32.8 % — ABNORMAL LOW (ref 36.0–46.0)
Hemoglobin: 10.3 g/dL — ABNORMAL LOW (ref 12.0–15.0)
MCH: 25.9 pg — ABNORMAL LOW (ref 26.0–34.0)
MCHC: 31.4 g/dL (ref 30.0–36.0)
MCV: 82.6 fL (ref 80.0–100.0)
Platelets: 146 10*3/uL — ABNORMAL LOW (ref 150–400)
RBC: 3.97 MIL/uL (ref 3.87–5.11)
RDW: 15.9 % — ABNORMAL HIGH (ref 11.5–15.5)
WBC: 7.4 10*3/uL (ref 4.0–10.5)
nRBC: 0 % (ref 0.0–0.2)

## 2021-07-23 LAB — COMPREHENSIVE METABOLIC PANEL
ALT: 15 U/L (ref 0–44)
AST: 17 U/L (ref 15–41)
Albumin: 2.4 g/dL — ABNORMAL LOW (ref 3.5–5.0)
Alkaline Phosphatase: 85 U/L (ref 38–126)
Anion gap: 7 (ref 5–15)
BUN: 5 mg/dL — ABNORMAL LOW (ref 6–20)
CO2: 19 mmol/L — ABNORMAL LOW (ref 22–32)
Calcium: 8.3 mg/dL — ABNORMAL LOW (ref 8.9–10.3)
Chloride: 101 mmol/L (ref 98–111)
Creatinine, Ser: 0.53 mg/dL (ref 0.44–1.00)
GFR, Estimated: >60 mL/min (ref >60)
Glucose, Bld: 89 mg/dL (ref 70–99)
Potassium: 4.2 mmol/L (ref 3.5–5.1)
Sodium: 127 mmol/L — ABNORMAL LOW (ref 135–145)
Total Bilirubin: 0.8 mg/dL (ref 0.3–1.2)
Total Protein: 5.8 g/dL — ABNORMAL LOW (ref 6.5–8.1)

## 2021-07-23 LAB — PROTEIN / CREATININE RATIO, URINE
Creatinine, Urine: 187.03 mg/dL
Protein Creatinine Ratio: 0.45 mg/mg{Cre} — ABNORMAL HIGH (ref 0.00–0.15)
Total Protein, Urine: 85 mg/dL

## 2021-07-23 LAB — RESP PANEL BY RT-PCR (FLU A&B, COVID) ARPGX2
Influenza A by PCR: POSITIVE — AB
Influenza B by PCR: NEGATIVE
SARS Coronavirus 2 by RT PCR: NEGATIVE

## 2021-07-23 MED ORDER — LACTATED RINGERS IV BOLUS: 1000.0000 mL | Freq: Once | INTRAVENOUS | Status: DC

## 2021-07-23 MED ORDER — PSEUDOEPHEDRINE HCL 30 MG PO TABS
60.0000 mg | ORAL_TABLET | Freq: Once | ORAL | Status: AC
  Administered 2021-07-23: 60 mg via ORAL
  Filled 2021-07-23: qty 2

## 2021-07-23 MED ORDER — CYCLOBENZAPRINE HCL 5 MG PO TABS
10.0000 mg | ORAL_TABLET | Freq: Once | ORAL | Status: AC
  Administered 2021-07-23: 10 mg via ORAL
  Filled 2021-07-23 (×2): qty 2

## 2021-07-23 MED ORDER — CYCLOBENZAPRINE HCL 7.5 MG PO TABS: 7.5000 mg | ORAL_TABLET | Freq: Three times a day (TID) | ORAL | 0 refills | Status: DC | PRN

## 2021-07-23 MED ORDER — ACETAMINOPHEN 500 MG PO TABS
1000.0000 mg | ORAL_TABLET | Freq: Once | ORAL | Status: AC
  Administered 2021-07-23: 1000 mg via ORAL
  Filled 2021-07-23: qty 2

## 2021-07-23 NOTE — MAU Provider Note (Signed)
History     CSN: [id number]  Arrival date and time: 07/23/21 1303   Event Date/Time   First Provider Initiated Contact with Patient 07/23/21 1405      Chief Complaint  Patient presents with   Headache   BP Evaluation   Sore Throat   URI   HPI [name] Li is a 40 y.o. G4P3003 at [redacted]w[redacted]d who presents for BP evaluation. Reports 3 day history of cold symptoms. Presented to [facility] this morning for those symptoms & was found to be hypertensive, was sent here. Denies hx of hypertension. Was given rx amoxicillin but states she doesn't know why they gave it to her. Denies being tested for or diagnosed with the flu.   Reports headache, right ear pain, nasal congestion, and sore throat x 3 days. Has been taking tylenol with some relief but symptoms return. Took first dose of amoxicillin this morning & states her ear pain has decreased since then. Currently rates headache 9/10. Similar to previous headaches she would get with her MS. Denies visual disturbance or epigastric pain. Denies abdominal pain, LOF, or vaginal bleeding. Has felt chills & "feverish" at home but hasn't checked her temperature.  Reports good fetal movement.   OB History     Gravida  4   Para  3   Term  3   Preterm  0   AB  0   Living  3      SAB  0   IAB  0   Ectopic  0   Multiple  0   Live Births  3           Past Medical History:  Diagnosis Date   Depression    Gestational diabetes    glyburide   Multiple sclerosis (HCC)    Nausea and vomiting during pregnancy 04/12/2021    Past Surgical History:  Procedure Laterality Date   NO PAST SURGERIES      Family History  Problem Relation Age of Onset   Mental illness Mother        depression and OCD - sees psychiatrist   Multiple births Mother        pregnant with twins but lost them   Hypertension Father    Diabetes Father    Heart disease Father    Diabetes Brother    Thyroid disease Brother     Social History   Tobacco  Use   Smoking status: Never   Smokeless tobacco: Never  Vaping Use   Vaping Use: Never used  Substance Use Topics   Alcohol use: No   Drug use: No    Allergies: No Known Allergies  Medications Prior to Admission  Medication Sig Dispense Refill Last Dose   aspirin EC 81 MG tablet Take 1 tablet (81 mg total) by mouth daily. Take after 12 weeks for prevention of preeclampsia later in pregnancy 300 tablet 2 07/23/2021   cholecalciferol (VITAMIN D3) 25 MCG (1000 UNIT) tablet Take 2,000 Units by mouth daily.   07/23/2021   metFORMIN (GLUCOPHAGE) 1000 MG tablet 1000mg  by mouth with breakfast and just before bed 60 tablet 5 07/22/2021   Prenatal Vit-Fe Fumarate-FA (PRENATAL MULTIVITAMIN) TABS tablet Take 1 tablet by mouth daily at 12 noon.   07/22/2021   promethazine (PHENERGAN) 25 MG tablet Take 1 tablet (25 mg total) by mouth every 6 (six) hours as needed for nausea or vomiting. 30 tablet 1 07/23/2021   ondansetron (ZOFRAN ODT) 8 MG disintegrating tablet Take 1 tablet (8  mg total) by mouth every 8 (eight) hours as needed for nausea or vomiting. (Patient not taking: No sig reported) 30 tablet 2    pantoprazole (PROTONIX) 20 MG tablet Take 1 tablet (20 mg total) by mouth 2 (two) times daily before a meal. (Patient not taking: Reported on 07/22/2021) 60 tablet 5     Review of Systems  Constitutional:  Positive for chills, diaphoresis and fever.  HENT:  Positive for congestion, ear pain, sinus pain and sore throat. Negative for ear discharge.   Eyes:  Negative for photophobia and visual disturbance.  Respiratory: Negative.    Cardiovascular: Negative.   Gastrointestinal: Negative.   Genitourinary: Negative.   Neurological:  Positive for headaches.  Physical Exam   Blood pressure 135/78, pulse 89, temperature 98 F (36.7 C), temperature source Oral, resp. rate 16, height 5\' 4"  (1.626 m), weight 111.4 kg, last menstrual period 12/05/2020, SpO2 100 %, unknown if currently  breastfeeding.  Patient Vitals for the past 24 hrs:  BP Temp Temp src Pulse Resp SpO2 Height Weight  07/23/21 1718 135/78 98 F (36.7 C) Oral 89 16 100 % -- --  07/23/21 1700 136/85 -- -- 89 -- -- -- --  07/23/21 1646 128/79 -- -- 81 -- -- -- --  07/23/21 1630 128/79 -- -- 80 -- -- -- --  07/23/21 1615 120/76 -- -- 90 -- -- -- --  07/23/21 1600 128/82 -- -- 85 -- -- -- --  07/23/21 1547 128/81 -- -- 88 -- -- -- --  07/23/21 1530 137/85 -- -- 85 -- -- -- --  07/23/21 1515 131/89 -- -- 90 -- -- -- --  07/23/21 1500 112/90 -- -- (!) 112 -- -- -- --  07/23/21 1445 (!) 141/91 -- -- 93 -- -- -- --  07/23/21 1430 126/85 -- -- (!) 103 -- -- -- --  07/23/21 1415 126/84 -- -- (!) 106 -- -- -- --  07/23/21 1401 129/82 -- -- (!) 110 -- -- -- --  07/23/21 1351 130/86 -- -- (!) 113 -- -- -- --  07/23/21 1326 140/86 97.8 F (36.6 C) Oral (!) 106 18 100 % 5\' 4"  (1.626 m) 111.4 kg    Physical Exam Vitals and nursing note reviewed.  Constitutional:      Appearance: She is well-developed. She is diaphoretic. She is not ill-appearing or toxic-appearing.  HENT:     Head: Normocephalic and atraumatic.  Eyes:     General: No scleral icterus.    Extraocular Movements: Extraocular movements intact.     Pupils: Pupils are equal, round, and reactive to light.  Cardiovascular:     Rate and Rhythm: Regular rhythm. Tachycardia present.     Heart sounds: Normal heart sounds.  Pulmonary:     Effort: Pulmonary effort is normal. No respiratory distress.     Breath sounds: Normal breath sounds.  Skin:    General: Skin is warm.     Coloration: Skin is not pale.  Neurological:     Mental Status: She is alert.     Sensory: No sensory deficit.  Psychiatric:        Mood and Affect: Mood normal.        Behavior: Behavior normal.   NST:  Baseline: 150 bpm, Variability: Good {> 6 bpm), Accelerations: Reactive, and Decelerations: Absent  MAU Course  Procedures Results for orders placed or performed during  the hospital encounter of 07/23/21 (from the past 24 hour(s))  Protein / creatinine ratio, urine  Status: Abnormal   Collection Time: 07/23/21  2:03 PM  Result Value Ref Range   Creatinine, Urine 187.03 mg/dL   Total Protein, Urine 85 mg/dL   Protein Creatinine Ratio 0.45 (H) 0.00 - 0.15 mg/mg[Cre]  Resp Panel by RT-PCR (Flu A&B, Covid) Nasopharyngeal Swab     Status: Abnormal   Collection Time: 07/23/21  2:38 PM   Specimen: Nasopharyngeal Swab; Nasopharyngeal(NP) swabs in vial transport medium  Result Value Ref Range   SARS Coronavirus 2 by RT PCR NEGATIVE NEGATIVE   Influenza A by PCR POSITIVE (A) NEGATIVE   Influenza B by PCR NEGATIVE NEGATIVE  CBC     Status: Abnormal   Collection Time: 07/23/21  3:42 PM  Result Value Ref Range   WBC 7.4 4.0 - 10.5 K/uL   RBC 3.97 3.87 - 5.11 MIL/uL   Hemoglobin 10.3 (L) 12.0 - 15.0 g/dL   HCT 88.4 (L) 16.6 - 06.3 %   MCV 82.6 80.0 - 100.0 fL   MCH 25.9 (L) 26.0 - 34.0 pg   MCHC 31.4 30.0 - 36.0 g/dL   RDW 01.6 (H) 01.0 - 93.2 %   Platelets 146 (L) 150 - 400 K/uL   nRBC 0.0 0.0 - 0.2 %  Comprehensive metabolic panel     Status: Abnormal   Collection Time: 07/23/21  3:42 PM  Result Value Ref Range   Sodium 127 (L) 135 - 145 mmol/L   Potassium 4.2 3.5 - 5.1 mmol/L   Chloride 101 98 - 111 mmol/L   CO2 19 (L) 22 - 32 mmol/L   Glucose, Bld 89 70 - 99 mg/dL   BUN 5 (L) 6 - 20 mg/dL   Creatinine, Ser 3.55 0.44 - 1.00 mg/dL   Calcium 8.3 (L) 8.9 - 10.3 mg/dL   Total Protein 5.8 (L) 6.5 - 8.1 g/dL   Albumin 2.4 (L) 3.5 - 5.0 g/dL   AST 17 15 - 41 U/L   ALT 15 0 - 44 U/L   Alkaline Phosphatase 85 38 - 126 U/L   Total Bilirubin 0.8 0.3 - 1.2 mg/dL   GFR, Estimated >73 >22 mL/min   Anion gap 7 5 - 15    MDM Patient sent from [facility] for elevated BP. Had 2 elevated BPs while in MAU. None severe range. Headache resolved with medication. PCR elevated. Diagnosis of preeclampsia added to problem list & sticky note. Patient already being followed  closely d/t GDM vs T2DM.   Reports flu like symptoms for 3 days. Symptoms treated with IV fluids and meds. Respiratory panel collected & is positive for flu A. Discussed symptomatic tx at home. Symptoms have been present too long for tamiflu.  Hospital provided Spanish interpreter used for this encounter  Assessment and Plan   1. Influenza A  -push fluids -OTC med list given  -symptomatic tx  2. Pre-eclampsia in third trimester  -added to problem list -reviewed s/s of severe preeclampsia  3. [redacted] weeks gestation of pregnancy      Judeth Horn 07/23/2021, 5:21 PM

## 2021-07-23 NOTE — Discharge Instructions (Signed)

## 2021-07-23 NOTE — MAU Note (Signed)
[facility], sore throat and ear chart.  States during visit her BP was elevated and she was instructed to go to MAU for evaluation.  Reports she Tylenol, last taken this morning @ 0300, it relieves H/A temporarily.  States s/p flu last week. Denies VB or LOF.  Endorses +FM.

## 2021-07-29 ENCOUNTER — Ambulatory Visit (INDEPENDENT_AMBULATORY_CARE_PROVIDER_SITE_OTHER): Payer: Medicaid Other

## 2021-07-29 ENCOUNTER — Other Ambulatory Visit: Payer: Self-pay

## 2021-07-29 ENCOUNTER — Ambulatory Visit: Payer: No Typology Code available for payment source | Admitting: *Deleted

## 2021-07-29 VITALS — BP 131/84 | HR 88 | Wt 248.0 lb

## 2021-07-29 DIAGNOSIS — O24415 Gestational diabetes mellitus in pregnancy, controlled by oral hypoglycemic drugs: Secondary | ICD-10-CM | POA: Diagnosis not present

## 2021-07-29 NOTE — Progress Notes (Signed)

## 2021-08-05 ENCOUNTER — Ambulatory Visit (INDEPENDENT_AMBULATORY_CARE_PROVIDER_SITE_OTHER): Payer: Self-pay | Admitting: Obstetrics & Gynecology

## 2021-08-05 ENCOUNTER — Other Ambulatory Visit: Payer: Self-pay

## 2021-08-05 ENCOUNTER — Other Ambulatory Visit (HOSPITAL_COMMUNITY)
Admission: RE | Admit: 2021-08-05 | Discharge: 2021-08-05 | Disposition: A | Discharge status: Home / Self Care | Payer: No Typology Code available for payment source | Source: Ambulatory Visit | Attending: Obstetrics & Gynecology | Admitting: Obstetrics & Gynecology

## 2021-08-05 ENCOUNTER — Ambulatory Visit (INDEPENDENT_AMBULATORY_CARE_PROVIDER_SITE_OTHER): Payer: No Typology Code available for payment source

## 2021-08-05 ENCOUNTER — Ambulatory Visit (INDEPENDENT_AMBULATORY_CARE_PROVIDER_SITE_OTHER): Payer: Self-pay | Admitting: General Practice

## 2021-08-05 ENCOUNTER — Encounter: Payer: Self-pay | Admitting: Obstetrics & Gynecology

## 2021-08-05 VITALS — BP 138/88 | HR 118 | Wt 250.4 lb

## 2021-08-05 DIAGNOSIS — O24415 Gestational diabetes mellitus in pregnancy, controlled by oral hypoglycemic drugs: Secondary | ICD-10-CM

## 2021-08-05 DIAGNOSIS — O24419 Gestational diabetes mellitus in pregnancy, unspecified control: Secondary | ICD-10-CM

## 2021-08-05 DIAGNOSIS — O0990 Supervision of high risk pregnancy, unspecified, unspecified trimester: Secondary | ICD-10-CM | POA: Insufficient documentation

## 2021-08-05 DIAGNOSIS — Z3A37 37 weeks gestation of pregnancy: Secondary | ICD-10-CM

## 2021-08-05 DIAGNOSIS — O219 Vomiting of pregnancy, unspecified: Secondary | ICD-10-CM

## 2021-08-05 LAB — OB RESULTS CONSOLE GC/CHLAMYDIA: Gonorrhea: NEGATIVE

## 2021-08-05 NOTE — Progress Notes (Signed)
Pt informed that the ultrasound is considered a limited OB ultrasound and is not intended to be a complete ultrasound exam.  Patient also informed that the ultrasound is not being completed with the intent of assessing for fetal or placental anomalies or any pelvic abnormalities.  Explained that the purpose of today's ultrasound is to assess for  BPP, presentation, and AFI.  Patient acknowledges the purpose of the exam and the limitations of the study.     Chase Caller RN BSN 08/05/21

## 2021-08-05 NOTE — Progress Notes (Signed)
PRENATAL VISIT NOTE  Subjective:  [name] Li is a 40 y.o. G4P3003 at [redacted]w[redacted]d being seen today for ongoing prenatal care.  Patient is Spanish-speaking only, interpreter present for this encounter. She is currently monitored for the following issues for this high-risk pregnancy and has Rh negative, antepartum; Obesity affecting pregnancy, antepartum; Multiple sclerosis (HCC); Numbness; Ataxic gait; High risk medication use; Supervision of high risk pregnancy, antepartum; Advanced maternal age in multigravida; GDM, class A2; BMI 40.0-44.9, adult (HCC); Language barrier; Multiple sclerosis affecting pregnancy in third trimester (HCC); LGA (large for gestational age) fetus affecting management of mother; and Anxiety and depression on their problem list.  Patient reports occasional contractions.  Contractions: Irritability. Vag. Bleeding: None.  Movement: Present. Denies leaking of fluid.   The following portions of the patient's history were reviewed and updated as appropriate: allergies, current medications, past family history, past medical history, past social history, past surgical history and problem list.   Objective:   Vitals:   08/05/21 0918  BP: 138/88  Pulse: (!) 118  Weight: 250 lb 6.4 oz (113.6 kg)    Fetal Status: Fetal Heart Rate (bpm): 145   Movement: Present     General:  Alert, oriented and cooperative. Patient is in no acute distress.  Skin: Skin is warm and dry. No rash noted.   Cardiovascular: Normal heart rate noted  Respiratory: Normal respiratory effort, no problems with respiration noted  Abdomen: Soft, gravid, appropriate for gestational age.  Pain/Pressure: Present     Pelvic: Cervical exam performed in the presence of a chaperone Dilation: Fingertip Effacement (%): Thick Station: Ballotable  Extremities: Normal range of motion.  Edema: Trace  Mental Status: Normal mood and affect. Normal behavior. Normal judgment and thought content.   Imaging: [place] MFM  FETAL BPP WO NON STRESS  Result Date: 07/14/2021 ----------------------------------------------------------------------  OBSTETRICS REPORT                       (Signed Final 07/14/2021 02:11 pm) ---------------------------------------------------------------------- Patient Info  ID #:       [id number]                          D.O.B.:  June 22, 1981 (40 yrs)  Name:       [name] Maw-                Visit Date: 07/14/2021 10:13 am              Li ---------------------------------------------------------------------- Performed By  Attending:        Noralee Space MD        Ref. Address[facility]     [facility]                                                             [facility]                                                             Healthcare  Performed By:     Clayton Lefort RDMS       Location[facility]  [facility]  Referred By:      Warden Fillers MD ---------------------------------------------------------------------- Orders  #  Description                           Code        Ordered By  1  [place] MFM FETAL BPP WO NON               76819.01    [facility]VI [facility]     STRESS  2  [place] MFM OB FOLLOW UP                   76816.01    [facility] ----------------------------------------------------------------------  #  Order #                     Accession #                Episode #  1  [id number]                   [id number]                 [id number]  2  [id number]                   [id number]                 [id number] ---------------------------------------------------------------------- Indications  Gestational diabetes in pregnancy,             O24.415  controlled by oral hypoglycemic drugs  (metformin)  [redacted] weeks gestation of pregnancy                Z3A.40  Advanced maternal age multigravida  4+,        O90.523  third trimester  Diseases of the nervous system                 O99.353  complicating pregnancy, third trimester  Obesity complicating pregnancy, third          O99.213  trimester (pregravid BMI 39)  Fetal choroid plexus cyst (resolved)           O35.8XX0 ---------------------------------------------------------------------- Fetal Evaluation  Num Of Fetuses:  1  Fetal Heart Rate(bpm):  152  Cardiac Activity:       Observed  Presentation:           Cephalic  Placenta:               Anterior  P. Cord Insertion:      Previously Visualized  Amniotic Fluid  AFI FV:      Within normal limits  AFI Sum(cm)     %Tile       Largest Pocket(cm)  18.67           69          5.87  RUQ(cm)       RLQ(cm)       LUQ(cm)        LLQ(cm)  3.41          4.17          5.22           5.87 ---------------------------------------------------------------------- Biophysical Evaluation  Amniotic F.V:   Within normal limits       F. Tone:        Observed  F. Movement:    Observed                   Score:          8/8  F. Breathing:   Observed ---------------------------------------------------------------------- Biometry  BPD:      90.2  mm     G. Age:  36w 4d         96  %    CI:        75.78   %    70 - 86                                                          FL/HC:      20.4   %    19.4 - 21.8  HC:      328.5  mm     G. Age:  37w 2d         89  %    HC/AC:      1.07        0.96 - 1.11  AC:      305.8  mm     G. Age:  34w 4d         66  %    FL/BPD:     74.2   %    71 - 87  FL:       66.9  mm     G. Age:  34w 3d         48  %    FL/AC:      21.9   %    20 - 24  Est. FW:    2565  gm    5 lb 10 oz      70  % ---------------------------------------------------------------------- OB History  Gravidity:    4         Term:   3  Living:       3 ---------------------------------------------------------------------- Gestational Age  LMP:           31w 4d        Date:  12/05/20  EDD:   09/11/21  U/S Today:      35w 5d                                        EDD:   08/13/21  Best:          34w 1d     Det. By:  Previous Ultrasound      EDD:   08/24/21                                      (04/12/21) ---------------------------------------------------------------------- Anatomy  Cranium:               Appears normal         Aortic Arch:            Previously seen  Cavum:                 Previously seen        Ductal Arch:            Previously seen  Ventricles:            Appears normal         Diaphragm:              Previously seen  Choroid Plexus:        Previously seen        Stomach:                Appears normal, left                                                                        sided  Cerebellum:            Previously seen        Abdomen:                Previously seen  Posterior Fossa:       Previously seen        Abdominal Wall:         Previously seen  Nuchal Fold:           Not applicable (>20    Cord Vessels:           Previously seen                         wks GA)  Face:                  Orbits and profile     Kidneys:                Appear normal                         previously seen  Lips:                  Previously seen        Bladder:  Appears normal  Thoracic:              Previously seen        Spine:                  Previously seen  Heart:                 Appears normal         Upper Extremities:      Previously seen                         (4CH, axis, and                         situs)  RVOT:                  Previously seen        Lower Extremities:      Previously seen  LVOT:                  Previously seen  Other:  Fetus-female  Right choroid plexus cyst resolved. Nasal bone and          lenses previously visualized. Heels/feet and open hands previously          visualized. Technically difficult due to maternal habitus ---------------------------------------------------------------------- Cervix Uterus Adnexa  Cervix  Not visualized (advanced GA >24wks)  Right Ovary  Not  visualized.  Left Ovary  Not visualized. ---------------------------------------------------------------------- Impression  Patient returned for fetal growth assessment and antenatal  testing.  She has gestational diabetes and takes metformin  1000 mg twice daily.  I reviewed her blood glucose log and  they are within normal range over the last 5 days.  Fetal growth is appropriate for gestational age .Amniotic fluid  is normal and good fetal activity is seen .Antenatal testing is  reassuring. BPP 8/8.  I reassured the patient of the findings. ---------------------------------------------------------------------- Recommendations  -Continue weekly BPP till delivery. ----------------------------------------------------------------------                  Noralee Space, MD Electronically Signed Final Report   07/14/2021 02:11 pm ----------------------------------------------------------------------  [place] MFM FETAL BPP WO NON STRESS  Result Date: 07/07/2021 ----------------------------------------------------------------------  OBSTETRICS REPORT                       (Signed Final 07/07/2021 01:47 pm) ---------------------------------------------------------------------- Patient Info  ID #:       [id number]                          D.O.B.:  [birth year] (40 yrs)  Name:       [name] Maw-                Visit Date: 07/07/2021 12:52 pm              Li ---------------------------------------------------------------------- Performed By  Attending:        Noralee Space MD        Ref. Address[facility]  Performed By:     Earley Brooke     Location:         [facility]                    BS, RDMS                                 [facility]                                                             [facility]  Referred By:      Warden Fillers MD ---------------------------------------------------------------------- Orders  #  Description                           Code        Ordered By  1  [place] MFM FETAL BPP WO NON               76819.01    [facility]     STRESS ----------------------------------------------------------------------  #  Order #                     Accession #                Episode #  1  [id number]                   [id number]                 [id number] ---------------------------------------------------------------------- Indications  Gestational diabetes in pregnancy,             O24.415  controlled by oral hypoglycemic drugs  (metformin)  [redacted] weeks gestation of pregnancy                Z3A.13  Advanced maternal age multigravida 62+,        O89.523  third trimester  Diseases of the nervous system                 O99.353  complicating pregnancy, third trimester  Obesity complicating pregnancy, third          O99.213  trimester (pregravid BMI 39)  Fetal choroid plexus cyst (resolved)           O35.8XX0 ---------------------------------------------------------------------- Fetal Evaluation  Num Of Fetuses:  1  Fetal Heart Rate(bpm):  149  Cardiac Activity:       Observed  Presentation:           Breech  Placenta:               Anterior  Amniotic Fluid  AFI FV:      Within normal limits  AFI Sum(cm)     %Tile       Largest Pocket(cm)  16.4            59          6.2  RUQ(cm)       RLQ(cm)       LUQ(cm)        LLQ(cm)  6.2           0             6.1            4.1 ---------------------------------------------------------------------- Biophysical Evaluation  Amniotic F.V:   Within normal limits       F. Tone:        Observed  F. Movement:    Observed                   Score:          8/8  F. Breathing:   Observed ---------------------------------------------------------------------- OB History  Gravidity:    4         Term:   3  Living:       3  ---------------------------------------------------------------------- Gestational Age  LMP:           30w 4d        Date:  12/05/20                 EDD:   09/11/21  Best:          33w 1d     Det. By:  Previous Ultrasound      EDD:   08/24/21                                      (04/12/21) ---------------------------------------------------------------------- Impression  Amniotic fluid is normal and good fetal activity is seen  .Antenatal testing is reassuring. BPP 8/8. ---------------------------------------------------------------------- Recommendations  -Continue weekly BPP till delivery. ----------------------------------------------------------------------                  Noralee Space, MD Electronically Signed Final Report   07/07/2021 01:47 pm ----------------------------------------------------------------------  [place] MFM OB FOLLOW UP  Result Date: 07/14/2021 ----------------------------------------------------------------------  OBSTETRICS REPORT                       (Signed Final 07/14/2021 02:11 pm) ---------------------------------------------------------------------- Patient Info  ID #:       [id number]                          D.O.B.:  1980-10-15 (40 yrs)  Name:       [name] Maw-                Visit Date: 07/14/2021 10:13 am              Li ---------------------------------------------------------------------- Performed By  Attending:        Noralee Space MD        Ref. Address[facility]  Performed By:     Clayton Lefort RDMS       Location[facility]         [facility]  Referred By:      Warden Fillers MD  ---------------------------------------------------------------------- Orders  #  Description                           Code        Ordered By  1  [place] MFM FETAL BPP WO NON               76819.01    [facility]ANK[facility]     STRESS  2  [place] MFM OB FOLLOW UP                   76816.01    [facility] ----------------------------------------------------------------------  #  Order #                     Accession #                Episode #  1  [id number]                   [id number]                 [id number]  2  [id number]                   [id number]                 [id number] ---------------------------------------------------------------------- Indications  Gestational diabetes in pregnancy,             O24.415  controlled by oral hypoglycemic drugs  (metformin)  [redacted] weeks gestation of pregnancy                Z3A.71  Advanced maternal age multigravida 35+,  Z61.096  third trimester  Diseases of the nervous system                 O99.353  complicating pregnancy, third trimester  Obesity complicating pregnancy, third          O99.213  trimester (pregravid BMI 39)  Fetal choroid plexus cyst (resolved)           O35.8XX0 ---------------------------------------------------------------------- Fetal Evaluation  Num Of Fetuses:         1  Fetal Heart Rate(bpm):  152  Cardiac Activity:       Observed  Presentation:           Cephalic  Placenta:               Anterior  P. Cord Insertion:      Previously Visualized  Amniotic Fluid  AFI FV:      Within normal limits  AFI Sum(cm)     %Tile       Largest Pocket(cm)  18.67           69          5.87  RUQ(cm)       RLQ(cm)       LUQ(cm)        LLQ(cm)  3.41          4.17          5.22           5.87 ---------------------------------------------------------------------- Biophysical Evaluation  Amniotic F.V:   Within normal limits       F. Tone:        Observed  F. Movement:    Observed                   Score:          8/8  F. Breathing:   Observed  ---------------------------------------------------------------------- Biometry  BPD:      90.2  mm     G. Age:  36w 4d         96  %    CI:        75.78   %    70 - 86                                                          FL/HC:      20.4   %    19.4 - 21.8  HC:      328.5  mm     G. Age:  37w 2d         89  %    HC/AC:      1.07        0.96 - 1.11  AC:      305.8  mm     G. Age:  34w 4d         66  %    FL/BPD:     74.2   %    71 - 87  FL:       66.9  mm     G. Age:  34w 3d         48  %    FL/AC:      21.9   %    20 - 24  Est. FW:  2565  gm    5 lb 10 oz      70  % ---------------------------------------------------------------------- OB History  Gravidity:    4         Term:   3  Living:       3 ---------------------------------------------------------------------- Gestational Age  LMP:           31w 4d        Date:  12/05/20                 EDD:   09/11/21  U/S Today:     35w 5d                                        EDD:   08/13/21  Best:          34w 1d     Det. By:  Previous Ultrasound      EDD:   08/24/21                                      (04/12/21) ---------------------------------------------------------------------- Anatomy  Cranium:               Appears normal         Aortic Arch:            Previously seen  Cavum:                 Previously seen        Ductal Arch:            Previously seen  Ventricles:            Appears normal         Diaphragm:              Previously seen  Choroid Plexus:        Previously seen        Stomach:                Appears normal, left                                                                        sided  Cerebellum:            Previously seen        Abdomen:                Previously seen  Posterior Fossa:       Previously seen        Abdominal Wall:         Previously seen  Nuchal Fold:           Not applicable (>20    Cord Vessels:           Previously seen                         wks GA)  Face:                  Orbits and  profile     Kidneys:                 Appear normal                         previously seen  Lips:                  Previously seen        Bladder:                Appears normal  Thoracic:              Previously seen        Spine:                  Previously seen  Heart:                 Appears normal         Upper Extremities:      Previously seen                         (4CH, axis, and                         situs)  RVOT:                  Previously seen        Lower Extremities:      Previously seen  LVOT:                  Previously seen  Other:  Fetus-female  Right choroid plexus cyst resolved. Nasal bone and          lenses previously visualized. Heels/feet and open hands previously          visualized. Technically difficult due to maternal habitus ---------------------------------------------------------------------- Cervix Uterus Adnexa  Cervix  Not visualized (advanced GA >24wks)  Right Ovary  Not visualized.  Left Ovary  Not visualized. ---------------------------------------------------------------------- Impression  Patient returned for fetal growth assessment and antenatal  testing.  She has gestational diabetes and takes metformin  1000 mg twice daily.  I reviewed her blood glucose log and  they are within normal range over the last 5 days.  Fetal growth is appropriate for gestational age .Amniotic fluid  is normal and good fetal activity is seen .Antenatal testing is  reassuring. BPP 8/8.  I reassured the patient of the findings. ---------------------------------------------------------------------- Recommendations  -Continue weekly BPP till delivery. ----------------------------------------------------------------------                  Noralee Space, MD Electronically Signed Final Report   07/14/2021 02:11 pm ----------------------------------------------------------------------   Assessment and Plan:  Pregnancy: D2K0254 at [redacted]w[redacted]d 1. GDM, class A2 Blood sugars reviewed, within range. Continue Metformin as  prescribed. Continue antenatal testing. IOL scheduled at 39 weeks (08/17/21) morning, orders signed and held.   2. [redacted] weeks gestation of pregnancy 3. Supervision of high risk pregnancy, antepartum Borderline BP, had elevated BP on 10/28 when she had a URI, none since. No over evidence of GHTN/preeclampsia for now. Pelvic cultures done today.  - Culture, beta strep (group b only) - GC/Chlamydia probe amp ([place])not at [facility] - AMBULATORY REFERRAL TO [facility] Term labor symptoms and general obstetric precautions including but not limited to vaginal bleeding, contractions, leaking of fluid and fetal movement were reviewed in  detail with the patient. Please refer to After Visit Summary for other counseling recommendations.   Return in about 1 week (around 08/12/2021) for OFFICE OB VISIT (MD only).  Future Appointments  Date Time Provider Department Center  08/05/2021 10:15 AM [facility] NST [facility]  08/12/2021 11:15 AM [facility] NURSE [facility]  08/12/2021 11:30 AM [facility] US3 [facility]  08/17/2021  7:15 AM [facility] SCHED ROOM [facility] None  01/05/2022 11:00 AM [name], [name], NP [facility] None    Jaynie Collins, MD

## 2021-08-05 NOTE — Patient Instructions (Signed)
Return to office for any scheduled appointments. Call the office or go to the MAU at [facility] if:  You begin to have strong, frequent contractions  Your water breaks.  Sometimes it is a big gush of fluid, sometimes it is just a trickle that keeps getting your panties wet or running down your legs  You have vaginal bleeding.  It is normal to have a small amount of spotting if your cervix was checked.   You do not feel your baby moving like normal.  If you do not, get something to eat and drink and lay down and focus on feeling your baby move.   If your baby is still not moving like normal, you should call the office or go to MAU.  Any other obstetric concerns.   

## 2021-08-06 LAB — GC/CHLAMYDIA PROBE AMP (~~LOC~~) NOT AT ARMC
Chlamydia: NEGATIVE
Comment: NEGATIVE
Comment: NORMAL
Neisseria Gonorrhea: NEGATIVE

## 2021-08-09 LAB — CULTURE, BETA STREP (GROUP B ONLY): Strep Gp B Culture: NEGATIVE

## 2021-08-10 ENCOUNTER — Other Ambulatory Visit: Payer: Self-pay | Admitting: Advanced Practice Midwife

## 2021-08-12 ENCOUNTER — Ambulatory Visit (INDEPENDENT_AMBULATORY_CARE_PROVIDER_SITE_OTHER): Payer: No Typology Code available for payment source | Admitting: Family Medicine

## 2021-08-12 ENCOUNTER — Other Ambulatory Visit: Payer: Self-pay

## 2021-08-12 ENCOUNTER — Inpatient Hospital Stay (HOSPITAL_BASED_OUTPATIENT_CLINIC_OR_DEPARTMENT_OTHER): Payer: Medicaid Other | Admitting: Obstetrics

## 2021-08-12 ENCOUNTER — Ambulatory Visit: Payer: No Typology Code available for payment source | Admitting: *Deleted

## 2021-08-12 ENCOUNTER — Inpatient Hospital Stay (HOSPITAL_COMMUNITY)
Admission: AD | Admit: 2021-08-12 | Discharge: 2021-08-15 | DRG: 768 | Disposition: A | Discharge status: Home / Self Care | Payer: Medicaid Other | Attending: Family Medicine | Admitting: Family Medicine

## 2021-08-12 ENCOUNTER — Ambulatory Visit: Payer: No Typology Code available for payment source | Attending: Obstetrics and Gynecology

## 2021-08-12 ENCOUNTER — Encounter (HOSPITAL_COMMUNITY): Payer: Self-pay | Admitting: Obstetrics & Gynecology

## 2021-08-12 VITALS — BP 137/96 | HR 87 | Wt 248.0 lb

## 2021-08-12 VITALS — BP 132/94 | HR 87

## 2021-08-12 DIAGNOSIS — O24425 Gestational diabetes mellitus in childbirth, controlled by oral hypoglycemic drugs: Secondary | ICD-10-CM | POA: Diagnosis present

## 2021-08-12 DIAGNOSIS — Z789 Other specified health status: Secondary | ICD-10-CM | POA: Diagnosis present

## 2021-08-12 DIAGNOSIS — F32A Depression, unspecified: Secondary | ICD-10-CM | POA: Diagnosis present

## 2021-08-12 DIAGNOSIS — O24419 Gestational diabetes mellitus in pregnancy, unspecified control: Secondary | ICD-10-CM

## 2021-08-12 DIAGNOSIS — Z7982 Long term (current) use of aspirin: Secondary | ICD-10-CM | POA: Diagnosis not present

## 2021-08-12 DIAGNOSIS — O133 Gestational [pregnancy-induced] hypertension without significant proteinuria, third trimester: Secondary | ICD-10-CM

## 2021-08-12 DIAGNOSIS — O09523 Supervision of elderly multigravida, third trimester: Secondary | ICD-10-CM

## 2021-08-12 DIAGNOSIS — Z6839 Body mass index (BMI) 39.0-39.9, adult: Secondary | ICD-10-CM | POA: Insufficient documentation

## 2021-08-12 DIAGNOSIS — Z3A38 38 weeks gestation of pregnancy: Secondary | ICD-10-CM | POA: Diagnosis not present

## 2021-08-12 DIAGNOSIS — O3663X0 Maternal care for excessive fetal growth, third trimester, not applicable or unspecified: Secondary | ICD-10-CM | POA: Diagnosis present

## 2021-08-12 DIAGNOSIS — G35 Multiple sclerosis: Secondary | ICD-10-CM | POA: Diagnosis present

## 2021-08-12 DIAGNOSIS — O0990 Supervision of high risk pregnancy, unspecified, unspecified trimester: Secondary | ICD-10-CM

## 2021-08-12 DIAGNOSIS — O24415 Gestational diabetes mellitus in pregnancy, controlled by oral hypoglycemic drugs: Secondary | ICD-10-CM

## 2021-08-12 DIAGNOSIS — O99210 Obesity complicating pregnancy, unspecified trimester: Secondary | ICD-10-CM | POA: Diagnosis present

## 2021-08-12 DIAGNOSIS — Z6791 Unspecified blood type, Rh negative: Secondary | ICD-10-CM | POA: Diagnosis not present

## 2021-08-12 DIAGNOSIS — O26899 Other specified pregnancy related conditions, unspecified trimester: Secondary | ICD-10-CM

## 2021-08-12 DIAGNOSIS — O99214 Obesity complicating childbirth: Secondary | ICD-10-CM | POA: Diagnosis present

## 2021-08-12 DIAGNOSIS — O99354 Diseases of the nervous system complicating childbirth: Secondary | ICD-10-CM | POA: Diagnosis present

## 2021-08-12 DIAGNOSIS — Z20822 Contact with and (suspected) exposure to covid-19: Secondary | ICD-10-CM | POA: Diagnosis present

## 2021-08-12 DIAGNOSIS — O1413 Severe pre-eclampsia, third trimester: Secondary | ICD-10-CM | POA: Diagnosis present

## 2021-08-12 DIAGNOSIS — O24424 Gestational diabetes mellitus in childbirth, insulin controlled: Secondary | ICD-10-CM | POA: Diagnosis not present

## 2021-08-12 DIAGNOSIS — O09529 Supervision of elderly multigravida, unspecified trimester: Secondary | ICD-10-CM

## 2021-08-12 DIAGNOSIS — O26893 Other specified pregnancy related conditions, third trimester: Secondary | ICD-10-CM | POA: Diagnosis present

## 2021-08-12 DIAGNOSIS — O1414 Severe pre-eclampsia complicating childbirth: Principal | ICD-10-CM | POA: Diagnosis present

## 2021-08-12 DIAGNOSIS — O99353 Diseases of the nervous system complicating pregnancy, third trimester: Secondary | ICD-10-CM

## 2021-08-12 DIAGNOSIS — F419 Anxiety disorder, unspecified: Secondary | ICD-10-CM | POA: Diagnosis present

## 2021-08-12 DIAGNOSIS — G35A Relapsing-remitting multiple sclerosis: Secondary | ICD-10-CM | POA: Diagnosis present

## 2021-08-12 DIAGNOSIS — O134 Gestational [pregnancy-induced] hypertension without significant proteinuria, complicating childbirth: Secondary | ICD-10-CM | POA: Diagnosis present

## 2021-08-12 DIAGNOSIS — O99213 Obesity complicating pregnancy, third trimester: Secondary | ICD-10-CM

## 2021-08-12 DIAGNOSIS — Z603 Acculturation difficulty: Secondary | ICD-10-CM | POA: Diagnosis present

## 2021-08-12 LAB — COMPREHENSIVE METABOLIC PANEL
ALT: 13 U/L (ref 0–44)
AST: 19 U/L (ref 15–41)
Albumin: 2.8 g/dL — ABNORMAL LOW (ref 3.5–5.0)
Alkaline Phosphatase: 109 U/L (ref 38–126)
Anion gap: 12 (ref 5–15)
BUN: 8 mg/dL (ref 6–20)
CO2: 18 mmol/L — ABNORMAL LOW (ref 22–32)
Calcium: 8.9 mg/dL (ref 8.9–10.3)
Chloride: 102 mmol/L (ref 98–111)
Creatinine, Ser: 0.52 mg/dL (ref 0.44–1.00)
GFR, Estimated: >60 mL/min (ref >60)
Glucose, Bld: 77 mg/dL (ref 70–99)
Potassium: 4 mmol/L (ref 3.5–5.1)
Sodium: 132 mmol/L — ABNORMAL LOW (ref 135–145)
Total Bilirubin: 0.9 mg/dL (ref 0.3–1.2)
Total Protein: 6.8 g/dL (ref 6.5–8.1)

## 2021-08-12 LAB — CBC
HCT: 36.9 % (ref 36.0–46.0)
HCT: 33 % — ABNORMAL LOW (ref 36.0–46.0)
Hemoglobin: 11.7 g/dL — ABNORMAL LOW (ref 12.0–15.0)
Hemoglobin: 10.6 g/dL — ABNORMAL LOW (ref 12.0–15.0)
MCH: 25.7 pg — ABNORMAL LOW (ref 26.0–34.0)
MCH: 25.9 pg — ABNORMAL LOW (ref 26.0–34.0)
MCHC: 31.7 g/dL (ref 30.0–36.0)
MCHC: 32.1 g/dL (ref 30.0–36.0)
MCV: 80.9 fL (ref 80.0–100.0)
MCV: 80.7 fL (ref 80.0–100.0)
Platelets: 172 10*3/uL (ref 150–400)
Platelets: 154 10*3/uL (ref 150–400)
RBC: 4.56 MIL/uL (ref 3.87–5.11)
RBC: 4.09 MIL/uL (ref 3.87–5.11)
RDW: 15.2 % (ref 11.5–15.5)
RDW: 15.2 % (ref 11.5–15.5)
WBC: 8.4 10*3/uL (ref 4.0–10.5)
WBC: 8.3 10*3/uL (ref 4.0–10.5)
nRBC: 0 % (ref 0.0–0.2)
nRBC: 0 % (ref 0.0–0.2)

## 2021-08-12 LAB — TYPE AND SCREEN
ABO/RH(D): O NEG
Antibody Screen: NEGATIVE

## 2021-08-12 LAB — GLUCOSE, CAPILLARY: Glucose-Capillary: 79 mg/dL (ref 70–99)

## 2021-08-12 LAB — PROTEIN / CREATININE RATIO, URINE
Creatinine, Urine: 114.01 mg/dL
Protein Creatinine Ratio: 0.53 mg/mg{Cre} — ABNORMAL HIGH (ref 0.00–0.15)
Total Protein, Urine: 60 mg/dL

## 2021-08-12 LAB — RESP PANEL BY RT-PCR (FLU A&B, COVID) ARPGX2
Influenza A by PCR: NEGATIVE
Influenza B by PCR: NEGATIVE
SARS Coronavirus 2 by RT PCR: NEGATIVE

## 2021-08-12 MED ORDER — EPHEDRINE 5 MG/ML INJ: 10.0000 mg | INTRAVENOUS | Status: DC | PRN

## 2021-08-12 MED ORDER — SOD CITRATE-CITRIC ACID 500-334 MG/5ML PO SOLN: 30.0000 mL | ORAL | Status: DC | PRN

## 2021-08-12 MED ORDER — OXYTOCIN BOLUS FROM INFUSION
333.0000 mL | Freq: Once | INTRAVENOUS | Status: AC
  Administered 2021-08-13: 333 mL via INTRAVENOUS

## 2021-08-12 MED ORDER — MAGNESIUM SULFATE BOLUS VIA INFUSION
4.0000 g | Freq: Once | INTRAVENOUS | Status: AC
  Administered 2021-08-12: 23:00:00 4 g via INTRAVENOUS
  Filled 2021-08-12: qty 1000

## 2021-08-12 MED ORDER — FENTANYL-BUPIVACAINE-NACL 0.5-0.125-0.9 MG/250ML-% EP SOLN
12.0000 mL/h | EPIDURAL | Status: DC | PRN
  Administered 2021-08-13: 12 mL/h via EPIDURAL
  Filled 2021-08-12: qty 250

## 2021-08-12 MED ORDER — PHENYLEPHRINE 40 MCG/ML (10ML) SYRINGE FOR IV PUSH (FOR BLOOD PRESSURE SUPPORT): 80.0000 ug | PREFILLED_SYRINGE | INTRAVENOUS | Status: DC | PRN

## 2021-08-12 MED ORDER — LACTATED RINGERS IV SOLN: 500.0000 mL | INTRAVENOUS | Status: DC | PRN

## 2021-08-12 MED ORDER — FLEET ENEMA 7-19 GM/118ML RE ENEM: 1.0000 | ENEMA | Freq: Every day | RECTAL | Status: DC | PRN

## 2021-08-12 MED ORDER — FENTANYL CITRATE (PF) 100 MCG/2ML IJ SOLN
50.0000 ug | INTRAMUSCULAR | Status: DC | PRN
  Administered 2021-08-12 – 2021-08-13 (×5): 100 ug via INTRAVENOUS
  Filled 2021-08-12 (×4): qty 2

## 2021-08-12 MED ORDER — TRANEXAMIC ACID-NACL 1000-0.7 MG/100ML-% IV SOLN
INTRAVENOUS | Status: AC
  Administered 2021-08-13: 1000 mg via INTRAVENOUS
  Filled 2021-08-12: qty 100

## 2021-08-12 MED ORDER — ACETAMINOPHEN 325 MG PO TABS
650.0000 mg | ORAL_TABLET | ORAL | Status: DC | PRN
  Administered 2021-08-12 – 2021-08-13 (×3): 650 mg via ORAL
  Filled 2021-08-12 (×2): qty 2

## 2021-08-12 MED ORDER — MAGNESIUM SULFATE 40 GM/1000ML IV SOLN
2.0000 g/h | INTRAVENOUS | Status: DC
  Administered 2021-08-13: 2 g/h via INTRAVENOUS
  Filled 2021-08-12 (×2): qty 1000

## 2021-08-12 MED ORDER — ZOLPIDEM TARTRATE 5 MG PO TABS: 5.0000 mg | ORAL_TABLET | Freq: Every evening | ORAL | Status: DC | PRN

## 2021-08-12 MED ORDER — HYDROXYZINE HCL 50 MG PO TABS: 50.0000 mg | ORAL_TABLET | Freq: Four times a day (QID) | ORAL | Status: DC | PRN

## 2021-08-12 MED ORDER — OXYTOCIN-SODIUM CHLORIDE 30-0.9 UT/500ML-% IV SOLN
2.5000 [IU]/h | INTRAVENOUS | Status: DC
  Filled 2021-08-12: qty 500

## 2021-08-12 MED ORDER — OXYCODONE-ACETAMINOPHEN 5-325 MG PO TABS: 2.0000 | ORAL_TABLET | ORAL | Status: DC | PRN

## 2021-08-12 MED ORDER — MISOPROSTOL 25 MCG QUARTER TABLET: 25.0000 ug | ORAL_TABLET | ORAL | Status: DC | PRN

## 2021-08-12 MED ORDER — LACTATED RINGERS IV SOLN
500.0000 mL | Freq: Once | INTRAVENOUS | Status: AC
  Administered 2021-08-13: 500 mL via INTRAVENOUS

## 2021-08-12 MED ORDER — LACTATED RINGERS IV SOLN: INTRAVENOUS | Status: DC

## 2021-08-12 MED ORDER — DIPHENHYDRAMINE HCL 50 MG/ML IJ SOLN: 12.5000 mg | INTRAMUSCULAR | Status: DC | PRN

## 2021-08-12 MED ORDER — MISOPROSTOL 50MCG HALF TABLET
ORAL_TABLET | ORAL | Status: AC
  Filled 2021-08-12: qty 1

## 2021-08-12 MED ORDER — OXYCODONE-ACETAMINOPHEN 5-325 MG PO TABS: 1.0000 | ORAL_TABLET | ORAL | Status: DC | PRN

## 2021-08-12 MED ORDER — TERBUTALINE SULFATE 1 MG/ML IJ SOLN: 0.2500 mg | Freq: Once | INTRAMUSCULAR | Status: DC | PRN

## 2021-08-12 MED ORDER — ONDANSETRON HCL 4 MG/2ML IJ SOLN: 4.0000 mg | Freq: Four times a day (QID) | INTRAMUSCULAR | Status: DC | PRN

## 2021-08-12 MED ORDER — MISOPROSTOL 50MCG HALF TABLET
50.0000 ug | ORAL_TABLET | ORAL | Status: DC | PRN
  Administered 2021-08-12: 15:00:00 50 ug via BUCCAL

## 2021-08-12 MED ORDER — LIDOCAINE HCL (PF) 1 % IJ SOLN: 30.0000 mL | INTRAMUSCULAR | Status: DC | PRN

## 2021-08-12 MED ORDER — OXYTOCIN-SODIUM CHLORIDE 30-0.9 UT/500ML-% IV SOLN
1.0000 m[IU]/min | INTRAVENOUS | Status: DC
  Administered 2021-08-12: 19:00:00 2 m[IU]/min via INTRAVENOUS
  Filled 2021-08-12: qty 500

## 2021-08-12 NOTE — Progress Notes (Signed)
Patient Vitals for the past 4 hrs:  BP Temp Pulse Resp  08/12/21 2310 -- 97.8 F (36.6 C) -- 16  08/12/21 2231 (!) 153/96 -- 79 --  08/12/21 2201 (!) 147/96 -- 78 --  08/12/21 2155 (!) 160/93 -- 79 --  08/12/21 2154 (!) 166/98 -- 79 16  08/12/21 2053 (!) 159/95 -- 77 18  08/12/21 2016 (!) 152/91 -- 81 18  08/12/21 1930 (!) 149/89 -- 87 18   C/O HA that never really went away after Tylenol this afternoon. Another dose of tylenol and IV fentanyl given. PC ratio still elevated. BPs meet criteria for severe features, as well as HA> Will start MgSO4 for preeclampsia w/SF.  Balloon out.  Ctx moderate, q 2-3 minutes. Pitocin at 8 mu/min. Requesting epidural. FHR Cat 1. Will AROM (if appropriate) after comfortable w/epidural.

## 2021-08-12 NOTE — Progress Notes (Signed)
MFM Note  Early Ord was seen for a growth scan and BPP due to advanced maternal age, obesity, and gestational diabetes treated with metformin.  The patient's blood pressures today were 148/96 and 132/94.  The patient reports that she is not feeling well and has been experiencing headaches along with seeing floaters since this morning.  The overall EFW obtained today was 8 pounds 4 ounces (86 percentile).  There was normal amniotic fluid noted.    The fetus is in the vertex presentation.  A BPP performed today was 8 out of 8.  Due to concerns regarding gestational hypertension/preeclampsia, the patient was sent to the MAU for further evaluation.    After discussing her care with the MAU team, the patient will be admitted for delivery as she meets the criteria for gestational hypertension.  This is not the first time that she has had hypertension in her current pregnancy.   All conversations were discussed with the patient today with the help of a Spanish interpreter.  A total of 20 minutes was spent counseling and coordinating the care for this patient.  Greater than 50% of the time was spent in direct face-to-face contact.

## 2021-08-12 NOTE — Progress Notes (Signed)
.  just got IV pain meds.  Ctx mild, q 3 minutes.  Cx 1/50/ballottable.  Cooks catheter inserted and inflated w/80cc H20.  Pt would love to deliver on 08/12/21, so will go ahead and start Pitocin.  Patient Vitals for the past 4 hrs:  BP Temp Temp src Pulse Resp  08/12/21 1901 (!) 153/96 -- -- 77 --  08/12/21 1830 (!) 141/84 98.4 F (36.9 C) Oral 83 18  08/12/21 1801 (!) 139/93 -- -- 86 --  08/12/21 1716 (!) 151/91 -- -- 81 18  08/12/21 1541 (!) 151/89 98.6 F (37 C) Oral 86 18

## 2021-08-12 NOTE — Progress Notes (Signed)
   PRENATAL VISIT NOTE  Subjective:  [name] Li is a 40 y.o. G4P3003 at [redacted]w[redacted]d being seen today for ongoing prenatal care.  She is currently monitored for the following issues for this high-risk pregnancy and has Rh negative, antepartum; Obesity affecting pregnancy, antepartum; Multiple sclerosis (HCC); Numbness; Ataxic gait; High risk medication use; Supervision of high risk pregnancy, antepartum; Advanced maternal age in multigravida; GDM, class A2; BMI 40.0-44.9, adult (HCC); Language barrier; Multiple sclerosis affecting pregnancy in third trimester (HCC); LGA (large for gestational age) fetus affecting management of mother; and Anxiety and depression on their problem list.  Patient reports no complaints.  Contractions: Irritability. Vag. Bleeding: None.  Movement: Present. Denies leaking of fluid.   The following portions of the patient's history were reviewed and updated as appropriate: allergies, current medications, past family history, past medical history, past social history, past surgical history and problem list.   Objective:   Vitals:   08/12/21 0934 08/12/21 0959  BP: (!) 142/95 (!) 137/96  Pulse: 87   Weight: 248 lb (112.5 kg)     Fetal Status: Fetal Heart Rate (bpm): 143 Fundal Height: 38 cm Movement: Present  Presentation: Vertex  General:  Alert, oriented and cooperative. Patient is in no acute distress.  Skin: Skin is warm and dry. No rash noted.   Cardiovascular: Normal heart rate noted  Respiratory: Normal respiratory effort, no problems with respiration noted  Abdomen: Soft, gravid, appropriate for gestational age.  Pain/Pressure: Present     Pelvic: Cervical exam deferred        Extremities: Normal range of motion.  Edema: None  Mental Status: Normal mood and affect. Normal behavior. Normal judgment and thought content.   Assessment and Plan:  Pregnancy: G4P3003 at [redacted]w[redacted]d 1. GDM, class A2  Good control on Metformin Last growth at 70%--f/u  today Has IOL at 39 wks BP is creeping up--no s/sx's of Pre-E  2. Multiple sclerosis affecting pregnancy in third trimester (HCC)   3. Rh negative, antepartum S/p Rhogam  4. Supervision of high risk pregnancy, antepartum GBS negative H/o SVD x 3--up to 10 lbs no issues with delivery  5. Language barrier Spanish interpreter: Eda used 3  6. Multigravida of advanced maternal age in third trimester   Term labor symptoms and general obstetric precautions including but not limited to vaginal bleeding, contractions, leaking of fluid and fetal movement were reviewed in detail with the patient. Please refer to After Visit Summary for other counseling recommendations.   Return in 1 week (on 08/19/2021).  Future Appointments  Date Time Provider Department Center  08/12/2021 11:15 AM [facility] NURSE [facility]  08/12/2021 11:30 AM [facility] US3 [facility]  08/17/2021  7:15 AM MC-LD SCHED ROOM [facility]INDC None  01/05/2022 11:00 AM [name], [name], NP [facility] None    Reva Bores, MD

## 2021-08-12 NOTE — H&P (Signed)
OBSTETRIC ADMISSION HISTORY AND PHYSICAL  Onika Gudiel is a 40 y.o. female [phone] with IUP at [redacted]w[redacted]d by 21 week [place] presenting for IOL due to gHTN. She reports +FMs, no LOF, no VB, and no significant contractions.  She has a slight headache but no vision changes, RUQ pain, or LE swelling.  She plans on breast feeding. She is considering IUD versus BTL for birth control postpartum.  She received her prenatal care at [facility].  Dating: By [place] --->  Estimated Date of Delivery: 08/24/21  Sono:   @[redacted]w[redacted]d , cephalic presentation, anterior placental lie, 3749 g, 86% EFW. BPP 8/8.  Prenatal History/Complications:  Rh negative (O neg) Obesity (BMI 42) AMA GDMA2 (Metformin)  Language barrier Multiple sclerosis (no meds currently)  Past Medical History: Past Medical History:  Diagnosis Date   Depression    Gestational diabetes    glyburide   Multiple sclerosis (HCC)    Nausea and vomiting during pregnancy 04/12/2021    Past Surgical History: Past Surgical History:  Procedure Laterality Date   NO PAST SURGERIES      Obstetrical History: OB History     Gravida  4   Para  3   Term  3   Preterm  0   AB  0   Living  3      SAB  0   IAB  0   Ectopic  0   Multiple  0   Live Births  3           Social History Social History   Socioeconomic History   Marital status: Married    Spouse name: Not on file   Number of children: Not on file   Years of education: Not on file   Highest education level: Not on file  Occupational History   Not on file  Tobacco Use   Smoking status: Never   Smokeless tobacco: Never  Vaping Use   Vaping Use: Never used  Substance and Sexual Activity   Alcohol use: No   Drug use: No   Sexual activity: Yes    Birth control/protection: None  Other Topics Concern   Not on file  Social History Narrative   Not on file   Social Determinants of Health   Financial Resource Strain: Not on file  Food Insecurity: No Food  Insecurity   Worried About Running Out of Food in the Last Year: Never true   Ran Out of Food in the Last Year: Never true  Transportation Needs: No Transportation Needs   Lack of Transportation (Medical): No   Lack of Transportation (Non-Medical): No  Physical Activity: Not on file  Stress: Not on file  Social Connections: Not on file    Family History: Family History  Problem Relation Age of Onset   Mental illness Mother        depression and OCD - sees psychiatrist   Multiple births Mother        pregnant with twins but lost them   Diabetes Father    Heart disease Father    Diabetes Brother    Thyroid disease Brother     Allergies: No Known Allergies  Medications Prior to Admission  Medication Sig Dispense Refill Last Dose   aspirin EC 81 MG tablet Take 1 tablet (81 mg total) by mouth daily. Take after 12 weeks for prevention of preeclampsia later in pregnancy 300 tablet 2    cholecalciferol (VITAMIN D3) 25 MCG (1000 UNIT) tablet Take 2,000 Units by  mouth daily.      metFORMIN (GLUCOPHAGE) 1000 MG tablet 1000mg  by mouth with breakfast and just before bed 60 tablet 5    pantoprazole (PROTONIX) 20 MG tablet Take 1 tablet (20 mg total) by mouth 2 (two) times daily before a meal. 60 tablet 5    Prenatal Vit-Fe Fumarate-FA (PRENATAL MULTIVITAMIN) TABS tablet Take 1 tablet by mouth daily at 12 noon.      promethazine (PHENERGAN) 25 MG tablet Take 1 tablet (25 mg total) by mouth every 6 (six) hours as needed for nausea or vomiting. 30 tablet 1      Review of Systems  All systems reviewed and negative except as stated in HPI  Blood pressure (!) 149/89, pulse 85, temperature 97.9 F (36.6 C), temperature source Oral, resp. rate 18, height 5\' 8"  (1.727 m), weight 114.3 kg, last menstrual period 12/05/2020, unknown if currently breastfeeding.  General appearance: alert, cooperative, and no distress Lungs: normal work of breathing on room air  Heart: normal rate, warm and well  perfused  Abdomen: soft, non-tender, gravid  Extremities: no LE edema or calf tenderness to palpation  Presentation: Cephalic confirmed by BSUS Fetal monitoring: Baseline 145 bpm, moderate variability, + accels, no decels  Uterine activity: Occasional contractions  Dilation: 1 Effacement (%): Thick Station: Ballotable Exam by:: 02/04/2021 RN   Prenatal labs: ABO, Rh: --/--/O NEG (11/17 1253) Antibody: NEG (11/17 1253) Rubella: 29.80 (06/01 1451) RPR: Non Reactive (08/31 1014)  HBsAg: Negative (06/01 1451)  HIV: Non Reactive (08/31 1014)  GBS: Negative/-- (11/10 1003)  2 hr Glucola - Abnormal Genetic screening - AFP neg Anatomy 11[birth year]07[birth year]1975 - choroid plexus cyst, otherwise normal   Prenatal Transfer Tool  Maternal Diabetes: Yes:  Diabetes Type:  Insulin/Medication controlled Genetic Screening: Normal (AFP only) Maternal Ultrasounds/Referrals: Choroid plexus cyst; otherwise normal  Fetal Ultrasounds or other Referrals:  None Maternal Substance Abuse:  No Significant Maternal Medications:  Metformin 1000 mg BID, ASA 81 mg daily Significant Maternal Lab Results: Group B Strep negative and Rh negative  Results for orders placed or performed during the hospital encounter of 08/12/21 (from the past 24 hour(s))  CBC   Collection Time: 08/12/21 12:53 PM  Result Value Ref Range   WBC 8.4 4.0 - 10.5 K/uL   RBC 4.56 3.87 - 5.11 MIL/uL   Hemoglobin 11.7 (L) 12.0 - 15.0 g/dL   HCT 08/14/21 08/14/21 - 41.4 %   MCV 80.9 80.0 - 100.0 fL   MCH 25.7 (L) 26.0 - 34.0 pg   MCHC 31.7 30.0 - 36.0 g/dL   RDW 23.9 53.2 - 02.3 %   Platelets 172 150 - 400 K/uL   nRBC 0.0 0.0 - 0.2 %  Type and screen   Collection Time: 08/12/21 12:53 PM  Result Value Ref Range   ABO/RH(D) O NEG    Antibody Screen NEG    Sample Expiration      08/15/2021,2359 Performed at [facility], [address] [place], [address] [place]   Glucose, capillary   Collection Time: 08/12/21  1:19 PM  Result Value Ref Range    Glucose-Capillary 79 70 - 99 mg/dL    Patient Active Problem List   Diagnosis Date Noted   Gestational diabetes mellitus (GDM) in third trimester 08/12/2021   LGA (large for gestational age) fetus affecting management of mother 06/21/2021   Anxiety and depression 06/21/2021   Multiple sclerosis affecting pregnancy in third trimester (HCC) 05/26/2021   BMI 40.0-44.9, adult (HCC) 04/26/2021  Language barrier 04/26/2021   GDM, class A2 03/04/2021   Advanced maternal age in multigravida 02/10/2021   Supervision of high risk pregnancy, antepartum 02/08/2021   High risk medication use 01/22/2020   Multiple sclerosis (HCC) 07/04/2019   Numbness 07/04/2019   Ataxic gait 07/04/2019   Rh negative, antepartum 01/11/2016   Obesity affecting pregnancy, antepartum 01/11/2016    Assessment/Plan:  Latria Mccarron is a 40 y.o. W2H8527 at [redacted]w[redacted]d here for IOL due to gestational HTN.   #Labor: Cytotec 50 mcg buccally given. Will reassess in 4 hours and place FB as able.  #Pain: PRN #FWB: Cat 1  #ID:  GBS neg #MOF: Breast #MOC: IUD versus BTL  #Rh negative: Rhogam eval postpartum   #Gestational HTN: Mild range. CBC, CMP, UPC ordered. Currently has a mild headache, not yet treated. Patient has not eaten since this morning. Will treat headache with Tylenol and plan for patient to eat something now. Will reassess symptoms afterwards and follow up pending labs.  #A2GDM: Glucose on admission 79. Continue q4hr glucose checks. SSI as needed.   In person interpreter used for entirety of encounter.   Worthy Rancher, MD  08/12/2021, 2:20 PM

## 2021-08-12 NOTE — Patient Instructions (Signed)

## 2021-08-13 ENCOUNTER — Encounter (HOSPITAL_COMMUNITY): Payer: Self-pay | Admitting: Obstetrics & Gynecology

## 2021-08-13 ENCOUNTER — Inpatient Hospital Stay (HOSPITAL_COMMUNITY): Payer: Medicaid Other | Admitting: Anesthesiology

## 2021-08-13 DIAGNOSIS — O1414 Severe pre-eclampsia complicating childbirth: Secondary | ICD-10-CM

## 2021-08-13 DIAGNOSIS — Z3A38 38 weeks gestation of pregnancy: Secondary | ICD-10-CM

## 2021-08-13 DIAGNOSIS — O24424 Gestational diabetes mellitus in childbirth, insulin controlled: Secondary | ICD-10-CM

## 2021-08-13 LAB — COMPREHENSIVE METABOLIC PANEL
ALT: 14 U/L (ref 0–44)
AST: 19 U/L (ref 15–41)
Albumin: 2.5 g/dL — ABNORMAL LOW (ref 3.5–5.0)
Alkaline Phosphatase: 101 U/L (ref 38–126)
Anion gap: 9 (ref 5–15)
BUN: 5 mg/dL — ABNORMAL LOW (ref 6–20)
CO2: 19 mmol/L — ABNORMAL LOW (ref 22–32)
Calcium: 7.7 mg/dL — ABNORMAL LOW (ref 8.9–10.3)
Chloride: 103 mmol/L (ref 98–111)
Creatinine, Ser: 0.59 mg/dL (ref 0.44–1.00)
GFR, Estimated: >60 mL/min (ref >60)
Glucose, Bld: 93 mg/dL (ref 70–99)
Potassium: 4.1 mmol/L (ref 3.5–5.1)
Sodium: 131 mmol/L — ABNORMAL LOW (ref 135–145)
Total Bilirubin: 1.1 mg/dL (ref 0.3–1.2)
Total Protein: 6.1 g/dL — ABNORMAL LOW (ref 6.5–8.1)

## 2021-08-13 LAB — CBC
HCT: 35.7 % — ABNORMAL LOW (ref 36.0–46.0)
Hemoglobin: 11.5 g/dL — ABNORMAL LOW (ref 12.0–15.0)
MCH: 26 pg (ref 26.0–34.0)
MCHC: 32.2 g/dL (ref 30.0–36.0)
MCV: 80.6 fL (ref 80.0–100.0)
Platelets: 147 10*3/uL — ABNORMAL LOW (ref 150–400)
RBC: 4.43 MIL/uL (ref 3.87–5.11)
RDW: 15.1 % (ref 11.5–15.5)
WBC: 11.9 10*3/uL — ABNORMAL HIGH (ref 4.0–10.5)
nRBC: 0.2 % (ref 0.0–0.2)

## 2021-08-13 LAB — MAGNESIUM: Magnesium: 4.4 mg/dL — ABNORMAL HIGH (ref 1.7–2.4)

## 2021-08-13 LAB — GLUCOSE, CAPILLARY
Glucose-Capillary: 109 mg/dL — ABNORMAL HIGH (ref 70–99)
Glucose-Capillary: 90 mg/dL (ref 70–99)
Glucose-Capillary: 92 mg/dL (ref 70–99)
Glucose-Capillary: 94 mg/dL (ref 70–99)
Glucose-Capillary: 96 mg/dL (ref 70–99)

## 2021-08-13 LAB — RPR: RPR Ser Ql: NONREACTIVE

## 2021-08-13 MED ORDER — MEASLES, MUMPS & RUBELLA VAC IJ SOLR: 0.5000 mL | Freq: Once | INTRAMUSCULAR | Status: DC

## 2021-08-13 MED ORDER — TRANEXAMIC ACID-NACL 1000-0.7 MG/100ML-% IV SOLN: 1000.0000 mg | INTRAVENOUS | Status: AC

## 2021-08-13 MED ORDER — ONDANSETRON HCL 4 MG PO TABS: 4.0000 mg | ORAL_TABLET | ORAL | Status: DC | PRN

## 2021-08-13 MED ORDER — DIBUCAINE (PERIANAL) 1 % EX OINT: 1.0000 "application " | TOPICAL_OINTMENT | CUTANEOUS | Status: DC | PRN

## 2021-08-13 MED ORDER — CEFAZOLIN SODIUM-DEXTROSE 2-4 GM/100ML-% IV SOLN
2.0000 g | Freq: Once | INTRAVENOUS | Status: AC
  Administered 2021-08-13: 2 g via INTRAVENOUS
  Filled 2021-08-13: qty 100

## 2021-08-13 MED ORDER — PRENATAL MULTIVITAMIN CH
1.0000 | ORAL_TABLET | Freq: Every day | ORAL | Status: DC
  Administered 2021-08-14 – 2021-08-15 (×2): 1 via ORAL
  Filled 2021-08-13 (×2): qty 1

## 2021-08-13 MED ORDER — IBUPROFEN 600 MG PO TABS
600.0000 mg | ORAL_TABLET | Freq: Once | ORAL | Status: AC
  Administered 2021-08-13: 600 mg via ORAL
  Filled 2021-08-13: qty 1

## 2021-08-13 MED ORDER — COCONUT OIL OIL: 1.0000 "application " | TOPICAL_OIL | Status: DC | PRN

## 2021-08-13 MED ORDER — SENNOSIDES-DOCUSATE SODIUM 8.6-50 MG PO TABS
2.0000 | ORAL_TABLET | Freq: Every day | ORAL | Status: DC
  Administered 2021-08-14 – 2021-08-15 (×2): 2 via ORAL
  Filled 2021-08-13 (×2): qty 2

## 2021-08-13 MED ORDER — LIDOCAINE HCL (PF) 1 % IJ SOLN
INTRAMUSCULAR | Status: DC | PRN
  Administered 2021-08-13 (×2): 4 mL via EPIDURAL

## 2021-08-13 MED ORDER — MEDROXYPROGESTERONE ACETATE 150 MG/ML IM SUSP: 150.0000 mg | INTRAMUSCULAR | Status: DC | PRN

## 2021-08-13 MED ORDER — MISOPROSTOL 200 MCG PO TABS
ORAL_TABLET | ORAL | Status: AC
  Filled 2021-08-13: qty 5

## 2021-08-13 MED ORDER — BUTALBITAL-APAP-CAFFEINE 50-325-40 MG PO TABS
1.0000 | ORAL_TABLET | Freq: Once | ORAL | Status: AC
  Administered 2021-08-13: 1 via ORAL
  Filled 2021-08-13: qty 1

## 2021-08-13 MED ORDER — BENZOCAINE-MENTHOL 20-0.5 % EX AERO: 1.0000 "application " | INHALATION_SPRAY | CUTANEOUS | Status: DC | PRN

## 2021-08-13 MED ORDER — DIPHENHYDRAMINE HCL 25 MG PO CAPS: 25.0000 mg | ORAL_CAPSULE | Freq: Four times a day (QID) | ORAL | Status: DC | PRN

## 2021-08-13 MED ORDER — IBUPROFEN 600 MG PO TABS
600.0000 mg | ORAL_TABLET | Freq: Four times a day (QID) | ORAL | Status: DC
  Administered 2021-08-13 – 2021-08-15 (×7): 600 mg via ORAL
  Filled 2021-08-13 (×7): qty 1

## 2021-08-13 MED ORDER — TETANUS-DIPHTH-ACELL PERTUSSIS 5-2.5-18.5 LF-MCG/0.5 IM SUSY: 0.5000 mL | PREFILLED_SYRINGE | Freq: Once | INTRAMUSCULAR | Status: DC

## 2021-08-13 MED ORDER — LACTATED RINGERS IV SOLN: INTRAVENOUS | Status: DC

## 2021-08-13 MED ORDER — ACETAMINOPHEN 325 MG PO TABS
650.0000 mg | ORAL_TABLET | ORAL | Status: DC | PRN
  Administered 2021-08-13 – 2021-08-15 (×5): 650 mg via ORAL
  Filled 2021-08-13 (×5): qty 2

## 2021-08-13 MED ORDER — ONDANSETRON HCL 4 MG/2ML IJ SOLN: 4.0000 mg | INTRAMUSCULAR | Status: DC | PRN

## 2021-08-13 MED ORDER — MAGNESIUM SULFATE 40 GM/1000ML IV SOLN: 2.0000 g/h | INTRAVENOUS | Status: AC

## 2021-08-13 MED ORDER — LACTATED RINGERS IV SOLN: 500.0000 mL | Freq: Once | INTRAVENOUS | Status: DC

## 2021-08-13 MED ORDER — WITCH HAZEL-GLYCERIN EX PADS: 1.0000 "application " | MEDICATED_PAD | CUTANEOUS | Status: DC | PRN

## 2021-08-13 MED ORDER — SIMETHICONE 80 MG PO CHEW: 80.0000 mg | CHEWABLE_TABLET | ORAL | Status: DC | PRN

## 2021-08-13 MED ORDER — MISOPROSTOL 200 MCG PO TABS
1000.0000 ug | ORAL_TABLET | Freq: Once | ORAL | Status: AC
Start: 2021-08-13 — End: 2021-08-13
  Administered 2021-08-13: 1000 ug via RECTAL

## 2021-08-13 NOTE — Progress Notes (Signed)
Patient Vitals for the past 4 hrs:  BP Temp Temp src Pulse Resp  08/13/21 0601 137/80 -- -- 91 18  08/13/21 0531 (!) 144/89 -- -- 91 --  08/13/21 0501 128/67 -- -- 83 16  08/13/21 0401 (!) 142/88 -- -- 88 --  08/13/21 0330 (!) 141/85 -- -- 85 --  08/13/21 0301 126/72 97.7 F (36.5 C) Oral 90 16  08/13/21 0231 122/63 -- -- 87 --   Cx 6/70/still too high for ROM.  Pitocin at 22 mu/min.  Comfortable w/epidural. FHR Cst 1. Will check again in a few hours.

## 2021-08-13 NOTE — Progress Notes (Signed)
Reassessed patient at bedside. Jada taken off suction and balloon deflated after one hour and left in place for 30 minutes prior to reassessment. No significant bleeding noted. Fundus firm. Jada removed without difficulty. Fundal rub performed and firm without significant return. Stable for transfer  to postpartum.   Evalina Field, MD  OB Fellow  Faculty Practice

## 2021-08-13 NOTE — Discharge Summary (Signed)
Postpartum Discharge Summary  Date of Service updated 08/15/2021      Patient Name: [name] Li DOB: Aug 19, 1981 MRN: [id number]  Date of admission: 08/12/2021 Delivery date:08/13/2021  Delivering provider: Genia Del  Date of discharge: 08/15/2021  Admitting diagnosis: Gestational diabetes mellitus (GDM) in third trimester [O24.419] Intrauterine pregnancy: [redacted]w[redacted]d    Secondary diagnosis:  Principal Problem:   SVD (spontaneous vaginal delivery) Active Problems:   Rh negative, antepartum   Obesity affecting pregnancy, antepartum   Multiple sclerosis ([place]   Advanced maternal age in multigravida   Language barrier   Anxiety and depression   Gestational diabetes mellitus (GDM) in third trimester   Preeclampsia, severe, third trimester  Additional problems: none    Discharge diagnosis: Term Pregnancy Delivered                                              Post partum procedures: no Augmentation: Pitocin, Cytotec, and IP Foley Complications: Postpartum hemorrhage (received Pitocin, TXA, Cytotec, and Jada placement with resolution)   Hospital course: Induction of Labor With Vaginal Delivery   40y.o. yo [id number] 340w3das admitted to the hospital 08/12/2021 for induction of labor.  Indication for induction:  Severe pre-eclampsia .  Patient started her induction with Cytotec and foley balloon placement. She was then started on Pitocin and had spontaneous rupture of membranes. She progressed to complete and had an uncomplicated vaginal delivery complicated by a postpartum hemorrhage due to atony of her lower uterine segment for which she received Pitocin, TXA, Cytotec, and Jada placement with subsequent improvement.  Membrane Rupture Time/Date: 11:05 AM ,08/13/2021   Delivery Method:Vaginal, Spontaneous  Episiotomy: None  Lacerations:  None  Details of delivery can be found in separate delivery note.  Patient had a routine postpartum course. Patient is  discharged home 08/15/21.  Newborn Data: Birth date:08/13/2021  Birth time:12:27 PM  Gender:Female  Living status:Living  Apgars:9 ,9  Weight:3100 g   Magnesium Sulfate received: Yes: Seizure prophylaxis BMZ received: No Rhophylac:Yes Rhogam eval MMR: N/A - Immune  T-DaP: Offered postpartum Flu: No Transfusion:No  Physical exam  Vitals:   08/14/21 2001 08/14/21 2349 08/15/21 0414 08/15/21 0750  BP: 124/73 138/78 123/75 128/83  Pulse: 89 82 84 73  Resp: '16 17 18 18  ' Temp: 98.1 F (36.7 C)  98.3 F (36.8 C) 97.8 F (36.6 C)  TempSrc: Oral  Oral Oral  SpO2: 99% 99% 99% 99%  Weight:      Height:      Blood pressure 128/83, pulse 73, temperature 97.8 F (36.6 C), temperature source Oral, resp. rate 18, height '5\' 8"'  (1.727 m), weight 114.3 kg, last menstrual period 12/05/2020, SpO2 99 %, unknown if currently breastfeeding.  General: alert, cooperative, and no distress Lochia: appropriate Uterine Fundus: firm Incision: Healing well with no significant drainage, N/A DVT Evaluation: No evidence of DVT seen on physical exam.  Labs: Lab Results  Component Value Date   WBC 8.4 08/14/2021   HGB 8.4 (L) 08/14/2021   HCT 27.0 (L) 08/14/2021   MCV 82.1 08/14/2021   PLT 136 (L) 08/14/2021   CMP Latest Ref Rng & Units 08/13/2021  Glucose 70 - 99 mg/dL 93  BUN 6 - 20 mg/dL 5(L)  Creatinine 0.44 - 1.00 mg/dL 0.59  Sodium 135 - 145 mmol/L 131(L)  Potassium 3.5 - 5.1  mmol/L 4.1  Chloride 98 - 111 mmol/L 103  CO2 22 - 32 mmol/L 19(L)  Calcium 8.9 - 10.3 mg/dL 7.7(L)  Total Protein 6.5 - 8.1 g/dL 6.1(L)  Total Bilirubin 0.3 - 1.2 mg/dL 1.1  Alkaline Phos 38 - 126 U/L 101  AST 15 - 41 U/L 19  ALT 0 - 44 U/L 14   Edinburgh Score: No flowsheet data found.   After visit meds:  Allergies as of 08/15/2021   No Known Allergies      Medication List     STOP taking these medications    aspirin EC 81 MG tablet   cholecalciferol 25 MCG (1000 UNIT) tablet Commonly known  as: VITAMIN D3   metFORMIN 1000 MG tablet Commonly known as: GLUCOPHAGE   promethazine 25 MG tablet Commonly known as: PHENERGAN       TAKE these medications    ibuprofen 600 MG tablet Commonly known as: ADVIL Take 1 tablet (600 mg total) by mouth every 6 (six) hours.   NIFEdipine 30 MG 24 hr tablet Commonly known as: ADALAT CC Take 1 tablet (30 mg total) by mouth daily.   pantoprazole 20 MG tablet Commonly known as: Protonix Take 1 tablet (20 mg total) by mouth 2 (two) times daily before a meal.   prenatal multivitamin Tabs tablet Take 1 tablet by mouth daily at 12 noon.         Discharge home in stable condition Infant Feeding: Breast Infant Disposition:rooming in Discharge instruction: per After Visit Summary and Postpartum booklet. Activity: Advance as tolerated. Pelvic rest for 6 weeks.  Diet: carb modified diet Future Appointments: Future Appointments  Date Time Provider [place]  01/05/2022 11:00 AM [name], [name], NP [facility] None   Follow up Visit:  [place] for [facility] at [facility] Follow up in 1 week(s).   Specialty: Obstetrics and Gynecology Why: BP check Contact information: [place] [address] [phone]               Message sent to [facility] by Dr. Gwenlyn Perking on 08/13/21.  Please schedule this patient for a In person postpartum visit in  4-6 weeks  with the following provider: MD. Additional Postpartum F/U: Postpartum Depression checkup, 2 hour GTT, and BP check 1 week  High risk pregnancy complicated by: GDM and Pre-eclampsia Delivery mode:  Vaginal, Spontaneous  Anticipated Birth Control:  Considering IUD outpatient    08/15/2021 Emeterio Reeve, MD

## 2021-08-13 NOTE — Progress Notes (Signed)
Assessed patient at bedside due to ongoing trickle of bleeding after delivery. Lower uterine segment boggy on exam. S/p Pitocin, TXA, and Cytotec. Recommended Jada placement. Patient verbally consented and agreed with placement. Interpreter services used via iPad. Jada placed without difficulty. Will reassess in 1 hour. Ancef 2g ordered for prophylaxis. Case discussed with Dr. Alvester Morin who agreed with plan of care.   Evalina Field, MD  OB Fellow  Faculty Practice

## 2021-08-13 NOTE — Anesthesia Procedure Notes (Signed)
Epidural Patient location during procedure: OB Start time: 08/13/2021 12:26 AM End time: 08/13/2021 12:30 AM  Staffing Anesthesiologist: Kaylyn Layer, MD Performed: anesthesiologist   Preanesthetic Checklist Completed: patient identified, IV checked, risks and benefits discussed, monitors and equipment checked, pre-op evaluation and timeout performed  Epidural Patient position: sitting Prep: DuraPrep and site prepped and draped Patient monitoring: continuous pulse ox, blood pressure and heart rate Approach: midline Location: L3-L4 Injection technique: LOR air  Needle:  Needle type: Tuohy  Needle gauge: 17 G Needle length: 9 cm Needle insertion depth: 7 cm Catheter type: closed end flexible Catheter size: 19 Gauge Catheter at skin depth: 12 cm Test dose: negative and Other (1% lidocaine)  Assessment Events: blood not aspirated, injection not painful, no injection resistance, no paresthesia and negative IV test  Additional Notes Patient identified. Risks, benefits, and alternatives discussed with patient including but not limited to bleeding, infection, nerve damage, paralysis, failed block, incomplete pain control, headache, blood pressure changes, nausea, vomiting, reactions to medication, itching, and postpartum back pain. Confirmed with bedside nurse the patient's most recent platelet count. Confirmed with patient that they are not currently taking any anticoagulation, have any bleeding history, or any family history of bleeding disorders. Patient expressed understanding and wished to proceed. All questions were answered. Sterile technique was used throughout the entire procedure. Please see nursing notes for vital signs.   Crisp LOR on first pass. Test dose was given through epidural catheter and negative prior to continuing to dose epidural or start infusion. Warning signs of high block given to the patient including shortness of breath, tingling/numbness in hands,  complete motor block, or any concerning symptoms with instructions to call for help. Patient was given instructions on fall risk and not to get out of bed. All questions and concerns addressed with instructions to call with any issues or inadequate analgesia.  Reason for block:procedure for pain

## 2021-08-13 NOTE — Progress Notes (Signed)
Labor Progress Note [name] Li is a 40 y.o. [phone] at [redacted]w[redacted]d presented for  IOL due to gHTN.   S: Resting comfortably in bed.   O:  BP (!) 142/90   Pulse 88   Temp 97.6 F (36.4 C) (Oral)   Resp 16   Ht 5\' 8"  (1.727 m)   Wt 114.3 kg   LMP 12/05/2020   SpO2 100%   BMI 38.32 kg/m  EFM: baseline 125 / moderate variability /accelerations present, no decels  CVE: Dilation: 6 Effacement (%): 70 Station: Ballotable Presentation: Vertex Exam by:: [email or web address], RN   A&P: 41 y.o. [phone] [redacted]w[redacted]d admitted for IOL due to gHTN #Labor: Progressing well. Will recheck soon. Will plan for AROM when possible and consider IUPC if needed. #Pain: Epidural placed. Comfortable. #FWB: Category I #GBS negative  #Pre-E with Severe Features: Protein:Crt ratio elevated upon admission (0.53). Continues to have mildly elevated pressures. No severe range pressures. Continues to have persistent headache. Continue Mg.   #GDMA2: Recent glucose 92. Continue to monitor.  [redacted]w[redacted]d, MD 10:04 AM

## 2021-08-13 NOTE — Progress Notes (Signed)
Patient Vitals for the past 4 hrs:  BP Temp Pulse Resp SpO2  08/13/21 0111 125/77 -- 80 -- --  08/13/21 0106 128/84 -- 82 18 --  08/13/21 0101 134/76 -- 89 18 --  08/13/21 0055 130/82 -- 85 18 --  08/13/21 0050 128/76 -- 97 -- --  08/13/21 0046 136/79 -- 79 -- --  08/13/21 0041 134/78 -- 78 -- --  08/13/21 0036 (!) 144/87 -- 83 -- --  08/13/21 0033 (!) 155/80 -- 83 -- --  08/13/21 0030 (!) 158/98 -- 86 -- 100 %  08/13/21 0001 (!) 147/84 -- 80 -- --  08/12/21 2351 (!) 155/92 -- 83 19 --  08/12/21 2341 (!) 158/97 -- 92 -- --  08/12/21 2338 (!) 149/83 -- 78 16 97 %  08/12/21 2330 (!) 149/86 -- 71 -- --  08/12/21 2310 -- 97.8 F (36.6 C) -- 16 --  08/12/21 2231 (!) 153/96 -- 79 -- --  08/12/21 2201 (!) 147/96 -- 78 -- --  08/12/21 2155 (!) 160/93 -- 79 -- --  08/12/21 2154 (!) 166/98 -- 79 16 --   Comfortable w/epidural.  MgSO4 bolus in, now infusing at 2gm/hr.  FHR Cat 1.  Ctx q 2 minutes/ Pitocin at 12 mu/min.  Cx 5/60/ballottable and not applied to cx.  Will continue present mgt, AROM when head settles down.

## 2021-08-13 NOTE — Anesthesia Preprocedure Evaluation (Addendum)
Anesthesia Evaluation  Patient identified by MRN, date of birth, ID band Patient awake    Reviewed: Allergy & Precautions, Patient's Chart, lab work & pertinent test results  History of Anesthesia Complications Negative for: history of anesthetic complications  Airway Mallampati: II  TM Distance: >3 FB Neck ROM: Full    Dental no notable dental hx.    Pulmonary neg pulmonary ROS,    Pulmonary exam normal        Cardiovascular hypertension, Normal cardiovascular exam     Neuro/Psych Anxiety Depression Multiple sclerosis    GI/Hepatic negative GI ROS, Neg liver ROS,   Endo/Other  diabetes, Gestational  Renal/GU negative Renal ROS  negative genitourinary   Musculoskeletal negative musculoskeletal ROS (+)   Abdominal   Peds  Hematology negative hematology ROS (+)   Anesthesia Other Findings Day of surgery medications reviewed with patient.  Reproductive/Obstetrics (+) Pregnancy (preE on Mg)                             Anesthesia Physical Anesthesia Plan  ASA: 3  Anesthesia Plan: Epidural   Post-op Pain Management:    Induction:   PONV Risk Score and Plan: Treatment may vary due to age or medical condition  Airway Management Planned: Natural Airway  Additional Equipment:   Intra-op Plan:   Post-operative Plan:   Informed Consent: I have reviewed the patients History and Physical, chart, labs and discussed the procedure including the risks, benefits and alternatives for the proposed anesthesia with the patient or authorized representative who has indicated his/her understanding and acceptance.     Interpreter used for [employer] Discussed with:   Anesthesia Plan Comments:        Anesthesia Quick Evaluation

## 2021-08-13 NOTE — Plan of Care (Signed)
A.Burnice Oestreicher, RN 

## 2021-08-14 LAB — CBC
HCT: 27 % — ABNORMAL LOW (ref 36.0–46.0)
Hemoglobin: 8.4 g/dL — ABNORMAL LOW (ref 12.0–15.0)
MCH: 25.5 pg — ABNORMAL LOW (ref 26.0–34.0)
MCHC: 31.1 g/dL (ref 30.0–36.0)
MCV: 82.1 fL (ref 80.0–100.0)
Platelets: 136 10*3/uL — ABNORMAL LOW (ref 150–400)
RBC: 3.29 MIL/uL — ABNORMAL LOW (ref 3.87–5.11)
RDW: 15.6 % — ABNORMAL HIGH (ref 11.5–15.5)
WBC: 8.4 10*3/uL (ref 4.0–10.5)
nRBC: 0 % (ref 0.0–0.2)

## 2021-08-14 LAB — GLUCOSE, CAPILLARY: Glucose-Capillary: 99 mg/dL (ref 70–99)

## 2021-08-14 MED ORDER — OXYCODONE HCL 5 MG PO TABS
5.0000 mg | ORAL_TABLET | Freq: Once | ORAL | Status: AC
  Administered 2021-08-14: 5 mg via ORAL
  Filled 2021-08-14: qty 1

## 2021-08-14 MED ORDER — NIFEDIPINE ER OSMOTIC RELEASE 30 MG PO TB24
30.0000 mg | ORAL_TABLET | Freq: Every day | ORAL | Status: DC
  Administered 2021-08-14 – 2021-08-15 (×2): 30 mg via ORAL
  Filled 2021-08-14 (×2): qty 1

## 2021-08-14 MED ORDER — RHO D IMMUNE GLOBULIN 1500 UNIT/2ML IJ SOSY
300.0000 ug | PREFILLED_SYRINGE | Freq: Once | INTRAMUSCULAR | Status: AC
Start: 2021-08-14 — End: 2021-08-14
  Administered 2021-08-14: 300 ug via INTRAVENOUS
  Filled 2021-08-14: qty 2

## 2021-08-14 NOTE — Lactation Note (Addendum)
This note was copied from a baby's chart. Lactation Consultation Note  Patient Name: [name] Li [id number] Date: 08/14/2021 Reason for consult: Initial assessment;Mother's request;Early term 37-38.6wks;Maternal endocrine disorder;Other (Comment);Hyperbilirubinemia (Multiple sclerosis L3 Medication Angelique Holm (med restart 1 month), PIH) Age:39 hours Spanish interpreter Gwendolyn Lima present to communicate with mom and dad.  Mom plan breastfeed for 1 month until she restarts her Multiple sclerosis medication Tysabri.  LC assisted with latching infant to breast in cross cradle with signs of milk transfer.   Plan 1. To feed based on cues 8-12x 24hr period. Mom to offer breasts and look for signs of milk transfer.  2. Mom to supplement after breastfeeding 7-15 ml EBM first followed by formula with slow flow nipple and pace bottle feeding.  3. DEBP q 3hrs for  4 I and O sheet reviewed.  All questions answered at the end of the visit.  Mom aware to offer more if infant not latching at breast 15-30 ml  Maternal Data Has patient been taught Hand Expression?: Yes Does the patient have breastfeeding experience prior to this delivery?: Yes How long did the patient breastfeed?: last child for 2 years  Feeding Mother's Current Feeding Choice: Breast Milk and Formula Nipple Type: Extra Slow Flow  LATCH Score Latch: Repeated attempts needed to sustain latch, nipple held in mouth throughout feeding, stimulation needed to elicit sucking reflex.  Audible Swallowing: Spontaneous and intermittent  Type of Nipple: Everted at rest and after stimulation  Comfort (Breast/Nipple): Soft / non-tender  Hold (Positioning): Assistance needed to correctly position infant at breast and maintain latch.  LATCH Score: 8   Lactation Tools Discussed/Used Tools: Pump;Flanges Flange Size: 24;27 Breast pump type: Double-Electric Breast Pump Pump Education: Setup, frequency, and cleaning;Milk  Storage Reason for Pumping: increase stimulation Pumping frequency: every 3 hrs for 15 min  Interventions Interventions: Breast feeding basics reviewed;Support pillows;Education;Assisted with latch;Position options;Pace feeding;Skin to skin;Expressed [employer];Infant Driven Feeding Algorithm education;Hand express;DEBP;Breast compression;Adjust position  Discharge [facility] Program: Yes  Consult Status Consult Status: Follow-up Date: 08/15/21 Follow-up type: In-patient    Viveca Beckstrom  Nicholson-Springer 08/14/2021, 12:51 PM

## 2021-08-14 NOTE — Progress Notes (Signed)
MOB was referred for history of depression/anxiety. * Referral screened out by Clinical Social Worker because none of the following criteria appear to apply: ~ History of anxiety/depression during this pregnancy, or of post-partum depression following prior delivery. ~ Diagnosis of anxiety and/or depression within last 3 years OR * MOB's symptoms currently being treated with medication and/or therapy. MOB has established care with [facility] [employer] The[facility]ist Asher Muir). Please contact the Clinical Social Worker if needs arise, by [facility] request, or if MOB scores greater than 9/yes to question 10 on Edinburgh Postpartum Depression Screen.   Blaine Hamper, MSW, LCSW Clinical Social Work [phone]

## 2021-08-14 NOTE — Progress Notes (Signed)
Post Partum Day 1 Subjective: up ad lib, tolerating PO, and cramps and backache, pain medicine does help  Objective: Blood pressure 129/84, pulse 67, temperature 98.1 F (36.7 C), temperature source Oral, resp. rate 16, height 5\' 8"  (1.727 m), weight 114.3 kg, last menstrual period 12/05/2020, SpO2 95 %, unknown if currently breastfeeding.  Physical Exam:  General: alert, cooperative, and no distress Lochia: appropriate Uterine Fundus: firm DVT Evaluation: No evidence of DVT seen on physical exam.  Recent Labs    08/13/21 1121 08/14/21 0539  HGB 11.5* 8.4*  HCT 35.7* 27.0*    Assessment/Plan: Plan for discharge tomorrow D/C magnesium at 1400. Start Procardia for BP  LOS: 2 days   08/16/21 08/14/2021, 9:27 AM

## 2021-08-14 NOTE — Anesthesia Postprocedure Evaluation (Signed)
Anesthesia Post Note  Patient: [name] Li  Procedure(s) Performed: AN AD HOC LABOR EPIDURAL     Patient location during evaluation: Mother Baby Anesthesia Type: Epidural Level of consciousness: awake Pain management: satisfactory to patient Vital Signs Assessment: post-procedure vital signs reviewed and stable Respiratory status: spontaneous breathing Cardiovascular status: stable Anesthetic complications: no   No notable events documented.  Last Vitals:  Vitals:   08/14/21 0719 08/14/21 0746  BP: 129/84   Pulse: 67   Resp: 18 16  Temp: 36.7 C   SpO2: 95%     Last Pain:  Vitals:   08/14/21 0902  TempSrc:   PainSc: 4    Pain Goal: Patients Stated Pain Goal: 3 (08/13/21 1903)                 Cephus Shelling

## 2021-08-15 LAB — RH IG WORKUP (INCLUDES ABO/RH)
Fetal Screen: NEGATIVE
Gestational Age(Wks): 38
Unit division: 0

## 2021-08-15 MED ORDER — IBUPROFEN 600 MG PO TABS: 600.0000 mg | ORAL_TABLET | Freq: Four times a day (QID) | ORAL | 0 refills | Status: DC

## 2021-08-15 MED ORDER — NIFEDIPINE ER 30 MG PO TB24: 30.0000 mg | ORAL_TABLET | Freq: Every day | ORAL | 1 refills | Status: DC

## 2021-08-15 NOTE — Progress Notes (Signed)
Pt given discharge instructions and all questions answered. Pt verbalized understanding. Spanish interpreter [place] at bedside. Pt's baby to remain a patient.

## 2021-08-15 NOTE — Lactation Note (Signed)
This note was copied from a baby's chart. Lactation Consultation Note  Patient Name: [name] Li [id number] Date: 08/15/2021   Age:39 hours  LC in to visit with P4 Mom of ET infant under double phototherapy.  Baby at 4.8% weight loss and having good output.  Mom is breastfeeding (latch scores of 8) alternating with formula supplementation.  DEBP set up at bedside, but Mom states she hasn't pumped.  Encouraged her to pump when baby is formula fed.  Mom eating her lunch currently.   Mom denies any questions or problems.    Follow-up IP 08/16/21  Johny Blamer E 08/15/2021, 11:59 AM

## 2021-08-16 ENCOUNTER — Ambulatory Visit: Payer: Self-pay

## 2021-08-16 NOTE — Lactation Note (Signed)
This note was copied from a baby's chart. Lactation Consultation Note  Patient Name: [name] Li [id number] Date: 08/16/2021 Reason for consult: Follow-up assessment;Early term 37-38.6wks;Hyperbilirubinemia;Maternal endocrine disorder Age:38 hours  LC in to visit with P4 Mom of ET infant on day of probably discharge.  Baby's bilirubin is stable and Ped to check one more at 1300 with phototherapy DC'd. Baby at 4% weight loss. Baby currently in cradle hold in Mom's lap breastfeeding.  LC placed pillow under baby and Mom stopped hunching over baby.   Encouraged STS with baby as much as possible. Mom had just double pumped and expressed 15 ml.   Mom to feed baby her EBM by paced bottle after breastfeeding.  LC disassembled pump parts, washed, rinsed and placed in separate bin to dry.  Mom does not wish to get a WIC pump as she is breastfeeding baby for a month before returning to her MS medications.  Mom shown the hand pump attachment to use.  Engorgement prevention and treatment reviewed. Mom shown the phone numbers for OP lactation support.  Mom denies any questions and is appreciative of lactation's help.  LATCH Score Latch: Grasps breast easily, tongue down, lips flanged, rhythmical sucking.  Audible Swallowing: Spontaneous and intermittent  Type of Nipple: Everted at rest and after stimulation  Comfort (Breast/Nipple): Soft / non-tender  Hold (Positioning): Assistance needed to correctly position infant at breast and maintain latch.  LATCH Score: 9   Lactation Tools Discussed/Used Tools: Pump;Flanges;Bottle Flange Size: 27 Breast pump type: Double-Electric Breast Pump;Manual Pump Education: Setup, frequency, and cleaning;Milk Storage Reason for Pumping: Supplementing baby Pumping frequency: prn Pumped volume: 15 mL  Interventions Interventions: Breast feeding basics reviewed;Assisted with latch;Skin to skin;Breast massage;Hand express;Breast  compression;Adjust position;Support pillows;Position options;DEBP;Hand pump;Education;Pace feeding  Discharge Discharge Education: Engorgement and breast care  Consult Status Consult Status: Complete Date: 08/16/21 Follow-up type: Call as needed    Judee Clara 08/16/2021, 12:19 PM

## 2021-08-17 ENCOUNTER — Inpatient Hospital Stay (HOSPITAL_COMMUNITY)
Admission: AD | Admit: 2021-08-17 | Payer: No Typology Code available for payment source | Source: Home / Self Care | Admitting: Obstetrics & Gynecology

## 2021-08-17 ENCOUNTER — Inpatient Hospital Stay (HOSPITAL_COMMUNITY): Payer: No Typology Code available for payment source

## 2021-08-18 ENCOUNTER — Telehealth: Payer: Self-pay

## 2021-08-18 NOTE — Telephone Encounter (Signed)
Dr. Epimenio Foot signed order and start form. Tysabri start form faxed to [employer].  Tysabri start form and order given to [facility] for processing.

## 2021-08-18 NOTE — Telephone Encounter (Signed)
I called patient to discuss. No answer, unable to leave a message.  Patient returned my call. An interpreter was needed and obtained via [employer].  Patient reports that she has delivered her baby. Baby girl has jaundice and patient was encouraged to breastfeed her for one month. She is agreeable to restarting tysabri and will coordinate her infusion date with the [facility] when she has finished breastfeeding.

## 2021-08-18 NOTE — Telephone Encounter (Signed)
It appears that patient delivered her baby.  I have given the Tysabri start form to Dr. Epimenio Foot for review and signature.  Patient was receiving Free Drug with [facility].

## 2021-08-23 ENCOUNTER — Other Ambulatory Visit: Payer: Self-pay

## 2021-08-23 ENCOUNTER — Ambulatory Visit (INDEPENDENT_AMBULATORY_CARE_PROVIDER_SITE_OTHER): Payer: Self-pay | Admitting: *Deleted

## 2021-08-23 ENCOUNTER — Ambulatory Visit: Payer: Self-pay

## 2021-08-23 VITALS — BP 136/94 | HR 88 | Ht 66.14 in | Wt 231.8 lb

## 2021-08-23 DIAGNOSIS — O165 Unspecified maternal hypertension, complicating the puerperium: Secondary | ICD-10-CM

## 2021-08-23 MED ORDER — LISINOPRIL 5 MG PO TABS: 5.0000 mg | ORAL_TABLET | Freq: Every day | ORAL | 0 refills | Status: DC

## 2021-08-23 NOTE — Progress Notes (Signed)
Here for bp check. IOL for GHTN and delivered 08/14/19 . Had Preeclampsia. D/c 11/21 on procardia. States took procardia last 08/20/21 because it gave her bad headaches=10. States took tylenol without any relief. States no headaches since she stopped the procardia. Still has 1+ edema in feet/ ankles.  Disussed bp readings, patient assessment, headaches with Dr. Alysia Penna. Ordered to stop Procardia. Start Lisinopril 5 mg daily, bp check one week. Patent voices understanding. Lazar Tierce,RN

## 2021-08-23 NOTE — Patient Instructions (Signed)
May use colace ( docusate sodium) stool softner to help with constipation.  Puedes usar colace (docusate de sodio) May use preparation H for hemorrhoid pain.  Tambien pomada Preparation H para las hemorroides.

## 2021-08-24 ENCOUNTER — Telehealth (HOSPITAL_COMMUNITY): Payer: Self-pay | Admitting: *Deleted

## 2021-08-24 NOTE — Progress Notes (Signed)
Agree with nurses's documentation of this patient's clinic encounter.  Ranessa Kosta L, MD  

## 2021-08-24 NOTE — Telephone Encounter (Signed)
Attempted hospital discharge follow-up call with [address] [place] Maidens [phone]. No voicemail set up. Deforest Hoyles, RN, 08/24/21, [phone].

## 2021-08-30 ENCOUNTER — Ambulatory Visit (INDEPENDENT_AMBULATORY_CARE_PROVIDER_SITE_OTHER): Payer: Self-pay

## 2021-08-30 ENCOUNTER — Other Ambulatory Visit: Payer: Self-pay

## 2021-08-30 VITALS — BP 142/90 | Wt 229.4 lb

## 2021-08-30 DIAGNOSIS — Z0130 Encounter for examination of blood pressure without abnormal findings: Secondary | ICD-10-CM

## 2021-08-30 NOTE — Patient Instructions (Signed)
[facility]- [phone]

## 2021-08-30 NOTE — Progress Notes (Signed)
Pt here today for BP check s/p change of BP medication to Lisinopril 5 mg daily.  With [employer], pt reports last dose was this morning.  Pt reports slight headache that went away with Tylenol and denies visual disturbances.  BP LA 144/92.  Rpt BP check  RA manually 142/90.   Reviewed blood pressures with Dr. Alysia Penna who recommends that pt increase dosage from Lisinopril 5 mg to 10 mg.  I informed pt providers recommendation and to come back in one week for rpt BP check.  I advised pt to watch out for low/high blood pressure sx's and to call the office with concerns.  Pt reports that she would like an IUD for birth control.  I informed pt to contact [facility] to schedule as she does not have insurance and she could get the device for free.  Pt provided with [facility] number to call in patient instructions.  Pt verbalized understanding to all that was discussed and had no further questions.   Pt has been scheduled rpt BP check on 09/09/21.    Griselle Rufer,RN  08/30/21

## 2021-08-30 NOTE — Progress Notes (Signed)
Agree with nurses's documentation of this patient's clinic encounter.  Dianey Suchy L, MD  

## 2021-09-02 ENCOUNTER — Ambulatory Visit: Payer: Self-pay | Admitting: Clinical

## 2021-09-02 DIAGNOSIS — Z91199 Patient's noncompliance with other medical treatment and regimen due to unspecified reason: Secondary | ICD-10-CM

## 2021-09-02 NOTE — BH Specialist Note (Signed)
Pt. Missed appointment. Behavioral health intern attempted to call the patient for appointment using interrupter Beulah Gandy [phone], and was unable to leave a voicemail.

## 2021-09-09 ENCOUNTER — Ambulatory Visit (INDEPENDENT_AMBULATORY_CARE_PROVIDER_SITE_OTHER): Payer: Self-pay

## 2021-09-09 ENCOUNTER — Other Ambulatory Visit: Payer: Self-pay

## 2021-09-09 VITALS — BP 121/74 | HR 99 | Wt 229.3 lb

## 2021-09-09 DIAGNOSIS — Z0130 Encounter for examination of blood pressure without abnormal findings: Secondary | ICD-10-CM

## 2021-09-09 NOTE — Progress Notes (Signed)
Blood Pressure Check Visit  [name] Li is here for blood pressure check following vaginal delivery on 08/13/21; dx of pre-eclampsia. Patient seen in office 08/30/21 for BP check and Lisinopril increased to 10 mg daily. Denies any headache, blurry vision, dizziness since that time. BP today is 121/74. PP appt needing to be rescheduled due to travel plans. Reviewed with [place] Bing, MD who states patient may continue current medication and follow up with PP appt around 09/24/21.  Encounter completed with interpreter Eda.   Marjo Bicker, RN 09/09/2021  10:32 AM

## 2021-09-11 NOTE — Progress Notes (Signed)
Patient was assessed and managed by nursing staff during this encounter. I have reviewed the chart and agree with the documentation and plan. I have also made any necessary editorial changes.  [place] Bing, MD 09/11/2021 2:58 PM

## 2021-09-13 ENCOUNTER — Telehealth: Payer: Self-pay | Admitting: General Practice

## 2021-09-13 DIAGNOSIS — O165 Unspecified maternal hypertension, complicating the puerperium: Secondary | ICD-10-CM

## 2021-09-13 MED ORDER — LISINOPRIL 10 MG PO TABS: 10.0000 mg | ORAL_TABLET | Freq: Every day | ORAL | 0 refills | Status: DC

## 2021-09-13 NOTE — Telephone Encounter (Signed)
Patient called into front office stating someone threw away her blood pressure medicine because she cannot find it anywhere. Patient states it is lisinopril 10mg . Also reports pharmacy keeps refilling another medicine she doesn't take anymore. Received refill approval from Dr & informed patient. Nifedipine Rx cancelled in epic as well. Patient asked if protonix was safe in breastfeeding. Told patient yes. Patient verbalized understanding & had no other questions.

## 2021-09-16 ENCOUNTER — Ambulatory Visit: Payer: Self-pay | Admitting: Obstetrics and Gynecology

## 2021-09-16 ENCOUNTER — Other Ambulatory Visit: Payer: Self-pay

## 2021-09-17 ENCOUNTER — Ambulatory Visit: Payer: Self-pay | Admitting: Obstetrics and Gynecology

## 2021-10-05 ENCOUNTER — Other Ambulatory Visit: Payer: Self-pay | Admitting: Family Medicine

## 2021-10-05 ENCOUNTER — Other Ambulatory Visit: Payer: Self-pay

## 2021-10-05 DIAGNOSIS — O165 Unspecified maternal hypertension, complicating the puerperium: Secondary | ICD-10-CM

## 2021-10-05 DIAGNOSIS — O0990 Supervision of high risk pregnancy, unspecified, unspecified trimester: Secondary | ICD-10-CM

## 2021-10-07 ENCOUNTER — Other Ambulatory Visit: Payer: Self-pay

## 2021-10-07 ENCOUNTER — Ambulatory Visit (INDEPENDENT_AMBULATORY_CARE_PROVIDER_SITE_OTHER): Payer: Self-pay | Admitting: Obstetrics and Gynecology

## 2021-10-07 DIAGNOSIS — O0990 Supervision of high risk pregnancy, unspecified, unspecified trimester: Secondary | ICD-10-CM

## 2021-10-07 DIAGNOSIS — Z789 Other specified health status: Secondary | ICD-10-CM

## 2021-10-07 DIAGNOSIS — G35 Multiple sclerosis: Secondary | ICD-10-CM

## 2021-10-07 DIAGNOSIS — O1413 Severe pre-eclampsia, third trimester: Secondary | ICD-10-CM

## 2021-10-07 DIAGNOSIS — O24419 Gestational diabetes mellitus in pregnancy, unspecified control: Secondary | ICD-10-CM

## 2021-10-07 DIAGNOSIS — Z124 Encounter for screening for malignant neoplasm of cervix: Secondary | ICD-10-CM

## 2021-10-07 NOTE — Progress Notes (Signed)
° °  [place] Partum Visit Note  [name] Li is a 41 y.o. [id number] s/p SVD/intact perineum on  11/18 at [redacted]w[redacted]d gestational weeks.  Anesthesia: epidural. Postpartum course has been going well per patient. Baby is doing well. Baby is feeding by bottle - gerber sensitive stomach . Bleeding no bleeding. Bowel function is normal. Bladder function is normal. Patient is sexually active. Contraception method is none. Postpartum depression screening: negative.   Review of Systems Pertinent items noted in HPI and remainder of comprehensive ROS otherwise negative.  Objective:  BP 132/88    Pulse 82    Wt 228 lb 9.6 oz (103.7 kg)    LMP 12/05/2020    Breastfeeding No    BMI 36.74 kg/m    NAD Assessment:   Norma postpartum visit.   Plan:  1. Postpartum care and examination No issues. Pt desires Paragard IUD. She is adopt a mom so I told her she'll have to go through the HD. I told her if she ever wants progestin only pills to let me know and we can send this in for her in the interim. Patient states neuro said it was okay to breastfeed with her MS medication but her pediatrician didn't recommend it and she is wondering what I thought and I told her I would defer to peds on that question.   2. GDM, class A2 PP GTT today  3. Preeclampsia, severe, third trimester Pt confirms on lisinopril 10 qday. BPs good and she has 7 left. I told her to finish it out and then will have her come back for an RN BP check  4. Multiple sclerosis ([place]) Followed by neurology  5. Cervical cancer screening No pap smear during pregnancy. I d/w her that will refer to [facility] program  6. Language barrier Interpreter used  RTC: Return for 2-3wk RN BP check.  Future Appointments  Date Time Provider [place]  10/07/2021  9:30 AM [facility]  01/05/2022 11:00 AM [name], [name], NP [facility] None    Aletha Halim, MD [facility] [employer], [place]

## 2021-10-08 LAB — GLUCOSE TOLERANCE, 2 HOURS W/ 1HR
Glucose, 1 hour: 135 mg/dL (ref 70–179)
Glucose, 2 hour: 103 mg/dL (ref 70–152)
Glucose, Fasting: 98 mg/dL — ABNORMAL HIGH (ref 70–91)

## 2021-10-18 NOTE — Telephone Encounter (Signed)
Patient had her tysabri infusion on 09/14/2021.

## 2021-10-21 ENCOUNTER — Telehealth: Payer: Self-pay | Admitting: *Deleted

## 2021-10-21 ENCOUNTER — Encounter: Payer: Self-pay | Admitting: Obstetrics and Gynecology

## 2021-10-21 DIAGNOSIS — R7303 Prediabetes: Secondary | ICD-10-CM | POA: Insufficient documentation

## 2021-10-21 NOTE — Telephone Encounter (Signed)
-----   Message from [place] Bing, MD sent at 10/21/2021 11:59 AM EST ----- Can you let her know that she may have pre diabetes so I just recommend watching her diet and exercise and to get a hemoglobin a1c every year to screen for diabetes? thanks

## 2021-10-21 NOTE — Telephone Encounter (Signed)
I called Dois Davenport with Interpreter Eda Royal and informed her results/ recommendations per Dr. Vergie Living. She voices understanding and states she already knew she was prediabetic. Evolette Pendell,RN

## 2021-10-28 ENCOUNTER — Other Ambulatory Visit: Payer: Self-pay

## 2021-10-28 ENCOUNTER — Ambulatory Visit (INDEPENDENT_AMBULATORY_CARE_PROVIDER_SITE_OTHER): Payer: Self-pay

## 2021-10-28 VITALS — BP 118/68 | HR 82 | Wt 228.1 lb

## 2021-10-28 DIAGNOSIS — Z0130 Encounter for examination of blood pressure without abnormal findings: Secondary | ICD-10-CM

## 2021-10-28 NOTE — Progress Notes (Signed)
Pt here today for BP check per Dr Ilda Basset. Pt had PP visit on 10/07/21 and suppose to finish taking Rx Lisinopril. Pt states took last Rx Lisinopril x 2 weeks ago. Pt denies any headaches, swelling or visual changes since delivery.   Pt BP today was 118/68.   Pt advised this was normal BP and was advised to follow up with PCP if has any s/s of HTN or elevated BP. Pt agreeable to plan of care.   Shelda Pal

## 2021-11-24 ENCOUNTER — Other Ambulatory Visit: Payer: Self-pay

## 2021-11-24 ENCOUNTER — Other Ambulatory Visit: Payer: Self-pay | Admitting: *Deleted

## 2021-11-24 DIAGNOSIS — Z124 Encounter for screening for malignant neoplasm of cervix: Secondary | ICD-10-CM

## 2021-11-24 NOTE — Progress Notes (Signed)
Patient: [name] Li           ?Date of Birth: 10/05/1980           ?MRN: [id number] ?Visit Date: 11/24/2021 ?PCP: Shea Stakes, MD ? ?Cervical Cancer Screening ?Do you smoke?: No ?Have you ever had or been told you have an allergy to latex products?: No ?Marital status: Married ?Date of last pap smear: More than 5 yrs ago (01/13/2014-Negative ([facility])) ?Date of last menstrual period: 11/13/21 ?Number of pregnancies: 4 ?Number of births: 4 ?Have you ever had any of the following? ?Hysterectomy: No ?Tubal ligation (tubes tied): No ?Abnormal bleeding: No ?Abnormal pap smear: No ?Venereal warts: No ?A sex partner with venereal warts: No ?A high risk* sex partner: No ? ?Cervical Exam ? ?Abnormal Observations: Normal Exam ?Recommendations: Last Pap smear 01/13/2014 at the [facility] and normal. Per patient has no history of an abnormal Pap smear. Last Pap smear result is available in Epic. Let patient know if today's Pap smear is normal and HPV negative that her next Pap smear will be due in 5 years. Informed patient that will follow up with her within the next couple of weeks with results of her Pap smear by phone. Patient verbalized understanding. ? ? ?Spanish interpreter Natale Lay from [facility] provided. ? ?Patient's History ?Patient Active Problem List  ? Diagnosis Date Noted  ? Pre-diabetes 10/21/2021  ? Preeclampsia, severe, third trimester 08/12/2021  ? Anxiety and depression 06/21/2021  ? BMI 40.0-44.9, adult (HCC) 04/26/2021  ? Language barrier 04/26/2021  ? High risk medication use 01/22/2020  ? Multiple sclerosis (HCC) 07/04/2019  ? Numbness 07/04/2019  ? 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 ?Past Medical History:  ?Diagnosis Date  ? Depression   ? Gestational diabetes   ? glyburide  ? Multiple sclerosis (HCC)   ? Nausea and vomiting during pregnancy 04/12/2021  ?  ?Family History  ?Problem Relation Age of Onset  ? Mental illness Mother   ?     depression and OCD - sees  psychiatrist  ? Multiple births Mother   ?     pregnant with twins but lost them  ? Diabetes Father   ? Heart disease Father   ? Diabetes Brother   ? Thyroid disease Brother   ?  ?Social History  ? ?Occupational History  ? Not on file  ?Tobacco Use  ? Smoking status: Never  ? Smokeless tobacco: Never  ?Vaping Use  ? Vaping Use: Never used  ?Substance and Sexual Activity  ? Alcohol use: No  ? Drug use: No  ? Sexual activity: Yes  ?  Birth control/protection: None  ? ?

## 2021-11-25 LAB — CYTOLOGY - PAP
Comment: NEGATIVE
Diagnosis: NEGATIVE
High risk HPV: NEGATIVE

## 2021-11-29 ENCOUNTER — Telehealth: Payer: Self-pay

## 2021-11-29 NOTE — Telephone Encounter (Signed)
Called patient via [employer] #[phone] to give pap smear results. Informed patient that pap smear was normal and HPV was negative. Based on this result her next pap smear will be due in 5 years. Patient voiced understanding.  ?

## 2022-01-05 ENCOUNTER — Ambulatory Visit (INDEPENDENT_AMBULATORY_CARE_PROVIDER_SITE_OTHER): Payer: Self-pay | Admitting: Family Medicine

## 2022-01-05 ENCOUNTER — Encounter: Payer: Self-pay | Admitting: Family Medicine

## 2022-01-05 ENCOUNTER — Telehealth: Payer: Self-pay | Admitting: Family Medicine

## 2022-01-05 ENCOUNTER — Telehealth: Payer: Self-pay

## 2022-01-05 VITALS — BP 127/78 | HR 90 | Ht 66.0 in | Wt 225.0 lb

## 2022-01-05 DIAGNOSIS — Z79899 Other long term (current) drug therapy: Secondary | ICD-10-CM

## 2022-01-05 DIAGNOSIS — R42 Dizziness and giddiness: Secondary | ICD-10-CM

## 2022-01-05 DIAGNOSIS — G35 Multiple sclerosis: Secondary | ICD-10-CM

## 2022-01-05 DIAGNOSIS — Z789 Other specified health status: Secondary | ICD-10-CM

## 2022-01-05 DIAGNOSIS — R200 Anesthesia of skin: Secondary | ICD-10-CM

## 2022-01-05 NOTE — Patient Instructions (Addendum)
Below is our plan: ? ?We will continue current treatment plan. Continue close follow up with care team for anemia. I will update labs, today.  ? ?Please make sure you are staying well hydrated. I recommend 50-60 ounces daily. Well balanced diet and regular exercise encouraged. Consistent sleep schedule with 6-8 hours recommended.  ? ?Please continue follow up with care team as directed.  ? ?Follow up with Dr Felecia Shelling in 6 months ? ?You may receive a survey regarding today's visit. I encourage you to leave honest feed back as I do use this information to improve patient care. Thank you for seeing me today!  ? ? ?

## 2022-01-05 NOTE — Telephone Encounter (Signed)
[facility] lab has been [facility]  ?

## 2022-01-05 NOTE — Telephone Encounter (Signed)
self pay order sent to [facility], they will reach out to the patient to schedule.  

## 2022-01-05 NOTE — Progress Notes (Signed)
? ? ?Chief Complaint  ?Patient presents with  ? Follow-up  ?  Rm 12  with interpreter [name]- here for 6 month f/u. Reports she has been doing well since her last visit. She reports at times she does have episodes of dizziness.  On Tysabri next infusion is 01/13/22.   ? ? ?HISTORY OF PRESENT ILLNESS: ? ?01/05/22 ALL:  ?[name] Li is a 41 y.o. female here today for follow up for RRMS. She has resumed Tysabri after healthy deliver of her 4th child in 07/2021. She is tolerating infusions. Last MRI brian 07/2020 was stable.  ? ?She feels that she is doing fairly well. No obvious exacerbating symptoms. She has intermittent numbness of bottom of left foot, no pain. Walking barefoot will trigger numbness. No trouble walking. No assistive device. No new weakness.  ? ?She has had some back pain following epidural injection during delivery.  ? ?She has had intermittent dizziness for about 2 months. She feels it is steadily improving. Last event was about a month ago. She feels that it was related to not sleeping. She is being treated for IDA with the [facility]. Last labs stable per patient report in 11/2021. She drinks about 6 glasses of water now but admits that she was not drinking much water.  ? ?She does not sleep for long periods. She is up and down at night with her newborn. She is not breastfeeding. She feels mood is good. She is able to stay at home and care for her children.  ? ?No bowel, bladder or vision changes.  ? ?HISTORY (copied from Sater's previous note) ? ?[name] Li is a 41 yo woman with relapsing remitting multiple sclerosis. ?  ?Update 06/30/2021: ?She is 32 weeks pregnanat ad is due November 29.    ?  ?She was on Tysabri and tolerated it well.   She remains JCV Ab negative.   The pregnancy was unplanned and she stopped the Tysabri around week 10.  She continues off on Tysabri and notes no exacerbations or new neurologic symptoms.  She does have some back pain and occasionally  will have a little bit of numbness in the hands. ?  ?Gait and balance are doing well.  She gets a little bit of numbness in the hands at times but no significant numbness.  Strength is fine.  Vision is fine.  She denies urinary dysfunction. ?  ?She has only mild fatigue.   She is active and does all activities that she wants to do.  Some sleep disturbance since later in the pregnancy.    She denies any problems with mood or cognition.    ?  ?MS history: ?She was diagnosed with MS March 2020 after presenting with neurologic symptoms and abnormal imaging studies.   In January 2020, she felt numbness in her right scalp and the entire left side with a heavy sensation in her left leg.   She also had mild issues with balance and gait.  Her right leg felt weak but the numbness and pain was more noted than weakness.  She had MRI studies initially of the lumbar spine which did not show significant degenerative changes but did show some abnormal signal in the thoracic spinal cord concerning for demyelination.  She then had an MRI of the cervical and thoracic spine 11/30/2018 showing multiple lesions including some that enhance, consistent with MS.  She had an MRI of the brain on 12/03/2018 showing multiple lesions consistent with MS including one that  enhanced.  She received 5 days of IV Solumedrol in a clinic.  She saw a neurologist in [place] and then had the infusions in [place]. She felt better after the steroids for the next few months. In retrospect she had numbness in her hands and legs at various times in 2019 and noted dysesthesias.     However, there were no imaging studies at that time. ?  ?No FH of MS.     ?  ?Imaging: ?MRI of the brain 08/12/2020 showed T2/FLAIR hyperintense foci in the periventricular, juxtacortical and deep white matter of the cerebral hemispheres in a pattern and configuration consistent with chronic demyelinating plaque associated with multiple sclerosis.  None of the foci enhances or  appears to be acute.  However, 2 of the chronic foci on the current MRI were not apparent on the 2020 MRI and likely occurred during the interim.  The abnormal enhancement associated with several of the lesions in 2020 has resolved and some of these foci are reduced in size compared to the previous MRI.  Normal enhancement pattern.  No acute findings. ?' ?MRI brain 12/03/2018:   Multiple T2/flair hyperintense foci predominantly in the periventricular white matter with some in the in the juxtacortical white matter.  A large focus in the right medial temporal lobe enhances after contrast. ?  ?MRI cervical spine 12/01/2018: Extensive T2 hyperintense signal within the spinal cord.  Posteriorly to the left at C2, medially at C2-C3, posteriorly to the right at C3 and towards the right at C3 (this focus enhances), medially and posteriorly at C4-C5, left greater than right, anteriorly at C5-C6 (mild enhancement), medially at C6-C7. ?  ?MRI thoracic spine 12/01/2018: This study is degraded by motion.  Extensive foci throughout this thoracic spine with probable enhancement at T2-T3, T6-T7 and T8-T9 (difficult to be certain due to motion and seen enhancement only on axial not sagittal views) ?  ?MRI lumbar spine 10/27/2018: Degenerative changes towards the right at L4-L5 but no nerve root compression.  Abnormal signal within the visible thoracic spinal cord concerning for demyelination. ? ? ?REVIEW OF SYSTEMS: Out of a complete 14 system review of symptoms, the patient complains only of the following symptoms, intermittent numbness of left foot, intermittent dizziness and all other reviewed systems are negative. ? ? ?ALLERGIES: ?No Known Allergies ? ? ?HOME MEDICATIONS: ?Outpatient Medications Prior to Visit  ?Medication Sig Dispense Refill  ? natalizumab (TYSABRI) 300 MG/15ML injection Inject into the vein.    ? Prenatal Vit-Fe Fumarate-FA (PRENATAL MULTIVITAMIN) TABS tablet Take 1 tablet by mouth daily at 12 noon.    ? ibuprofen  (ADVIL) 600 MG tablet Take 1 tablet (600 mg total) by mouth every 6 (six) hours. (Patient not taking: Reported on 01/05/2022) 30 tablet 0  ? ?No facility-administered medications prior to visit.  ? ? ? ?PAST MEDICAL HISTORY: ?Past Medical History:  ?Diagnosis Date  ? Depression   ? Gestational diabetes   ? glyburide  ? Multiple sclerosis ([place])   ? Nausea and vomiting during pregnancy 04/12/2021  ? ? ? ?PAST SURGICAL HISTORY: ?Past Surgical History:  ?Procedure Laterality Date  ? NO PAST SURGERIES    ? ? ? ?FAMILY HISTORY: ?Family History  ?Problem Relation Age of Onset  ? Mental illness Mother   ?     depression and OCD - sees psychiatrist  ? Multiple births Mother   ?     pregnant with twins but lost them  ? Diabetes Father   ? Heart disease  Father   ? Diabetes Brother   ? Thyroid disease Brother   ? ? ? ?SOCIAL HISTORY: ?Social History  ? ?Socioeconomic History  ? Marital status: Married  ?  Spouse name: Not on file  ? Number of children: Not on file  ? Years of education: Not on file  ? Highest education level: Not on file  ?Occupational History  ? Not on file  ?Tobacco Use  ? Smoking status: Never  ? Smokeless tobacco: Never  ?Vaping Use  ? Vaping Use: Never used  ?Substance and Sexual Activity  ? Alcohol use: No  ? Drug use: No  ? Sexual activity: Yes  ?  Birth control/protection: None  ?Other Topics Concern  ? Not on file  ?Social History Narrative  ? Not on file  ? ?Social Determinants of Health  ? ?Financial Resource Strain: Not on file  ?Food Insecurity: No Food Insecurity  ? Worried About Charity fundraiser in the Last Year: Never true  ? Ran Out of Food in the Last Year: Never true  ?Transportation Needs: No Transportation Needs  ? Lack of Transportation (Medical): No  ? Lack of Transportation (Non-Medical): No  ?Physical Activity: Not on file  ?Stress: Not on file  ?Social Connections: Not on file  ?Intimate Partner Violence: Not on file  ? ? ? ?PHYSICAL EXAM ? ?Vitals:  ? 01/05/22 1100  ?BP: 127/78   ?Pulse: 90  ?Weight: 225 lb (102.1 kg)  ?Height: 5\' 6"  (1.676 m)  ? ?Body mass index is 36.32 kg/m?. ? ?Generalized: Well developed, in no acute distress ? ?Cardiology: normal rate and rhythm, no murmur auscultated

## 2022-01-06 LAB — CBC WITH DIFFERENTIAL/PLATELET
Basophils Absolute: 0 10*3/uL (ref 0.0–0.2)
Basos: 0 %
EOS (ABSOLUTE): 0.1 10*3/uL (ref 0.0–0.4)
Eos: 2 %
Hematocrit: 37.3 % (ref 34.0–46.6)
Hemoglobin: 12.3 g/dL (ref 11.1–15.9)
Immature Grans (Abs): 0 10*3/uL (ref 0.0–0.1)
Immature Granulocytes: 0 %
Lymphocytes Absolute: 3.1 10*3/uL (ref 0.7–3.1)
Lymphs: 40 %
MCH: 25.5 pg — ABNORMAL LOW (ref 26.6–33.0)
MCHC: 33 g/dL (ref 31.5–35.7)
MCV: 77 fL — ABNORMAL LOW (ref 79–97)
Monocytes Absolute: 0.4 10*3/uL (ref 0.1–0.9)
Monocytes: 5 %
Neutrophils Absolute: 4.1 10*3/uL (ref 1.4–7.0)
Neutrophils: 53 %
Platelets: 194 10*3/uL (ref 150–450)
RBC: 4.82 x10E6/uL (ref 3.77–5.28)
RDW: 16.4 % — ABNORMAL HIGH (ref 11.7–15.4)
WBC: 7.8 10*3/uL (ref 3.4–10.8)

## 2022-01-13 ENCOUNTER — Other Ambulatory Visit (INDEPENDENT_AMBULATORY_CARE_PROVIDER_SITE_OTHER): Payer: Self-pay

## 2022-01-13 DIAGNOSIS — Z0289 Encounter for other administrative examinations: Secondary | ICD-10-CM

## 2022-01-13 NOTE — Telephone Encounter (Signed)
JCV ab drawn on 01/05/22 negative, index: 0.19.  ?

## 2022-05-23 ENCOUNTER — Emergency Department (HOSPITAL_BASED_OUTPATIENT_CLINIC_OR_DEPARTMENT_OTHER): Payer: Self-pay

## 2022-05-23 ENCOUNTER — Other Ambulatory Visit: Payer: Self-pay

## 2022-05-23 ENCOUNTER — Emergency Department (HOSPITAL_COMMUNITY): Admission: EM | Admit: 2022-05-23 | Discharge: 2022-05-23 | Discharge status: Against Medical Advice | Payer: Self-pay

## 2022-05-23 ENCOUNTER — Observation Stay (HOSPITAL_BASED_OUTPATIENT_CLINIC_OR_DEPARTMENT_OTHER)
Admission: EM | Admit: 2022-05-23 | Discharge: 2022-05-26 | Disposition: A | Discharge status: Home / Self Care | Payer: Self-pay | Attending: Surgery | Admitting: Surgery

## 2022-05-23 ENCOUNTER — Encounter (HOSPITAL_BASED_OUTPATIENT_CLINIC_OR_DEPARTMENT_OTHER): Payer: Self-pay | Admitting: Emergency Medicine

## 2022-05-23 DIAGNOSIS — Z79899 Other long term (current) drug therapy: Secondary | ICD-10-CM | POA: Insufficient documentation

## 2022-05-23 DIAGNOSIS — K810 Acute cholecystitis: Secondary | ICD-10-CM | POA: Diagnosis present

## 2022-05-23 DIAGNOSIS — K8012 Calculus of gallbladder with acute and chronic cholecystitis without obstruction: Principal | ICD-10-CM | POA: Insufficient documentation

## 2022-05-23 LAB — COMPREHENSIVE METABOLIC PANEL
ALT: 377 U/L — ABNORMAL HIGH (ref 0–44)
AST: 487 U/L — ABNORMAL HIGH (ref 15–41)
Albumin: 4.7 g/dL (ref 3.5–5.0)
Alkaline Phosphatase: 89 U/L (ref 38–126)
Anion gap: 10 (ref 5–15)
BUN: 8 mg/dL (ref 6–20)
CO2: 24 mmol/L (ref 22–32)
Calcium: 10.1 mg/dL (ref 8.9–10.3)
Chloride: 105 mmol/L (ref 98–111)
Creatinine, Ser: 0.55 mg/dL (ref 0.44–1.00)
GFR, Estimated: >60 mL/min (ref >60)
Glucose, Bld: 105 mg/dL — ABNORMAL HIGH (ref 70–99)
Potassium: 3.9 mmol/L (ref 3.5–5.1)
Sodium: 139 mmol/L (ref 135–145)
Total Bilirubin: 1.8 mg/dL — ABNORMAL HIGH (ref 0.3–1.2)
Total Protein: 7.6 g/dL (ref 6.5–8.1)

## 2022-05-23 LAB — CBC WITH DIFFERENTIAL/PLATELET
Abs Immature Granulocytes: 0.02 10*3/uL (ref 0.00–0.07)
Basophils Absolute: 0 10*3/uL (ref 0.0–0.1)
Basophils Relative: 0 %
Eosinophils Absolute: 0.1 10*3/uL (ref 0.0–0.5)
Eosinophils Relative: 1 %
HCT: 40.6 % (ref 36.0–46.0)
Hemoglobin: 13.2 g/dL (ref 12.0–15.0)
Immature Granulocytes: 0 %
Lymphocytes Relative: 30 %
Lymphs Abs: 2.2 10*3/uL (ref 0.7–4.0)
MCH: 27.2 pg (ref 26.0–34.0)
MCHC: 32.5 g/dL (ref 30.0–36.0)
MCV: 83.7 fL (ref 80.0–100.0)
Monocytes Absolute: 0.4 10*3/uL (ref 0.1–1.0)
Monocytes Relative: 6 %
Neutro Abs: 4.5 10*3/uL (ref 1.7–7.7)
Neutrophils Relative %: 63 %
Platelets: 168 10*3/uL (ref 150–400)
RBC: 4.85 MIL/uL (ref 3.87–5.11)
RDW: 14.2 % (ref 11.5–15.5)
WBC: 7.1 10*3/uL (ref 4.0–10.5)
nRBC: 0 % (ref 0.0–0.2)

## 2022-05-23 LAB — PREGNANCY, URINE: Preg Test, Ur: NEGATIVE

## 2022-05-23 LAB — URINALYSIS, ROUTINE W REFLEX MICROSCOPIC
Bilirubin Urine: NEGATIVE
Glucose, UA: NEGATIVE mg/dL
Hgb urine dipstick: NEGATIVE
Ketones, ur: NEGATIVE mg/dL
Nitrite: NEGATIVE
Protein, ur: NEGATIVE mg/dL
Specific Gravity, Urine: 1.009 (ref 1.005–1.030)
pH: 6.5 (ref 5.0–8.0)

## 2022-05-23 LAB — WET PREP, GENITAL
Clue Cells Wet Prep HPF POC: NONE SEEN
Sperm: NONE SEEN
Trich, Wet Prep: NONE SEEN
WBC, Wet Prep HPF POC: >=10 — AB (ref <10)
Yeast Wet Prep HPF POC: NONE SEEN

## 2022-05-23 LAB — LIPASE, BLOOD: Lipase: 20 U/L (ref 11–51)

## 2022-05-23 MED ORDER — MORPHINE SULFATE (PF) 4 MG/ML IV SOLN
4.0000 mg | Freq: Once | INTRAVENOUS | Status: AC
  Administered 2022-05-23: 4 mg via INTRAVENOUS
  Filled 2022-05-23: qty 1

## 2022-05-23 MED ORDER — SODIUM CHLORIDE 0.9 % IV SOLN
2.0000 g | Freq: Once | INTRAVENOUS | Status: AC
  Administered 2022-05-23: 2 g via INTRAVENOUS
  Filled 2022-05-23: qty 20

## 2022-05-23 MED ORDER — SODIUM CHLORIDE 0.9 % IV SOLN: INTRAVENOUS | Status: DC | PRN

## 2022-05-23 MED ORDER — SODIUM CHLORIDE 0.9 % IV BOLUS
1000.0000 mL | Freq: Once | INTRAVENOUS | Status: AC
  Administered 2022-05-23: 1000 mL via INTRAVENOUS

## 2022-05-23 MED ORDER — ONDANSETRON HCL 4 MG/2ML IJ SOLN
4.0000 mg | Freq: Once | INTRAMUSCULAR | Status: AC
  Administered 2022-05-23: 4 mg via INTRAVENOUS
  Filled 2022-05-23: qty 2

## 2022-05-23 NOTE — ED Provider Notes (Signed)
[facility] EMERGENCY DEPT Provider Note   CSN: [id number] Arrival date & time: 05/23/22  1149     History  Chief Complaint  Patient presents with   Abdominal Pain    [name] Li is a 41 y.o. female.  [name] Li is a 41 y.o. female with a history of MS, otherwise healthy, who presents to the emergency department for evaluation of right upper quadrant abdominal pain radiating to her back.  She reports this pain has occurred intermittently over the past few months but has become more persistent over the past few days.  She reports initially pain was only present after eating and then would improve but she reports that recently pain has not been improving.  She reports some nausea but no vomiting.  Normal bowel movements.  She also reports some urinary frequency and discomfort with urination.  No fevers or chills.  The history is provided by the patient. The history is limited by a language barrier. A language interpreter was used.  Abdominal Pain Associated symptoms: dysuria, nausea and vaginal discharge   Associated symptoms: no chest pain, no chills, no constipation, no cough, no diarrhea, no fever, no shortness of breath, no vaginal bleeding and no vomiting        Home Medications Prior to Admission medications   Medication Sig Start Date End Date Taking? Authorizing Provider  natalizumab (TYSABRI) 300 MG/15ML injection Inject into the vein.    [provider]  Prenatal Vit-Fe Fumarate-FA (PRENATAL MULTIVITAMIN) TABS tablet Take 1 tablet by mouth daily at 12 noon.    [provider]      Allergies    Patient has no known allergies.    Review of Systems   Review of Systems  Constitutional:  Negative for chills and fever.  HENT: Negative.    Respiratory:  Negative for cough and shortness of breath.   Cardiovascular:  Negative for chest pain.  Gastrointestinal:  Positive for abdominal pain and nausea. Negative for  constipation, diarrhea and vomiting.  Genitourinary:  Positive for dysuria, frequency and vaginal discharge. Negative for vaginal bleeding.  Musculoskeletal:  Negative for arthralgias and myalgias.  Skin:  Negative for color change and rash.  All other systems reviewed and are negative.   Physical Exam Updated Vital Signs BP 119/77 (BP Location: Right Arm)   Pulse 64   Temp 97.8 F (36.6 C) (Oral)   Resp 16   Ht 5\' 6"  (1.676 m)   Wt 104.3 kg   LMP 05/08/2022 (Approximate)   SpO2 100%   BMI 37.12 kg/m  Physical Exam Vitals and nursing note reviewed.  Constitutional:      General: She is not in acute distress.    Appearance: Normal appearance. She is well-developed. She is not ill-appearing or diaphoretic.  HENT:     Head: Normocephalic and atraumatic.     Mouth/Throat:     Mouth: Mucous membranes are moist.     Pharynx: Oropharynx is clear.  Eyes:     General:        Right eye: No discharge.        Left eye: No discharge.     Pupils: Pupils are equal, round, and reactive to light.  Cardiovascular:     Rate and Rhythm: Normal rate and regular rhythm.     Pulses: Normal pulses.     Heart sounds: Normal heart sounds.  Pulmonary:     Effort: Pulmonary effort is normal. No respiratory distress.     Breath  sounds: Normal breath sounds. No wheezing or rales.     Comments: Respirations equal and unlabored, patient able to speak in full sentences, lungs clear to auscultation bilaterally  Abdominal:     General: Bowel sounds are normal. There is no distension.     Palpations: Abdomen is soft. There is no mass.     Tenderness: There is abdominal tenderness in the right upper quadrant and epigastric area. Positive signs include Murphy's sign.     Comments: Abdomen soft, nondistended, focal tenderness in the right upper quadrant with some mild epigastric tenderness, all other quadrants nontender to palpation, positive Murphy sign.  Musculoskeletal:        General: No deformity.      Cervical back: Neck supple.  Skin:    General: Skin is warm and dry.     Capillary Refill: Capillary refill takes less than 2 seconds.  Neurological:     Mental Status: She is alert and oriented to person, place, and time.     Coordination: Coordination normal.     Comments: Speech is clear, able to follow commands Moves extremities without ataxia, coordination intact  Psychiatric:        Mood and Affect: Mood normal.        Behavior: Behavior normal.     ED Results / Procedures / Treatments   Labs (all labs ordered are listed, but only abnormal results are displayed) Labs Reviewed  WET PREP, GENITAL - Abnormal; Notable for the following components:      Result Value   WBC, Wet Prep HPF POC >=10 (*)    All other components within normal limits  URINALYSIS, ROUTINE W REFLEX MICROSCOPIC - Abnormal; Notable for the following components:   Leukocytes,Ua SMALL (*)    All other components within normal limits  COMPREHENSIVE METABOLIC PANEL - Abnormal; Notable for the following components:   Glucose, Bld 105 (*)    AST 487 (*)    ALT 377 (*)    Total Bilirubin 1.8 (*)    All other components within normal limits  PREGNANCY, URINE  CBC WITH DIFFERENTIAL/PLATELET  LIPASE, BLOOD  GC/CHLAMYDIA PROBE AMP ([place]) NOT AT [facility]    EKG None  Radiology US Abdomen Limited RUQ (LIVER/GB)  Result Date: 05/23/2022 CLINICAL DATA:  Right upper quadrant pain EXAM: ULTRASOUND ABDOMEN LIMITED RIGHT UPPER QUADRANT COMPARISON:  Abdominal ultrasound 05/07/2021 FINDINGS: Gallbladder: Gallstones are present measuring up to 7 mm. There is gallbladder wall thickening measuring 3.8 mm. The gallbladder is dilated. There is a small amount of pericholecystic fluid. Sonographic Eulah Pont sign is positive per sonographer. Common bile duct: Diameter: 3 mm Liver: No focal lesion identified. Increase in parenchymal echogenicity. Portal vein is patent on color Doppler imaging with normal direction of blood flow  towards the liver. Other: None. IMPRESSION: 1. Cholelithiasis, dilated gallbladder, wall thickening and pericholecystic fluid with positive sonographic Murphy sign highly concerning for acute cholecystitis. 2. Echogenic liver likely related to diffuse fatty infiltration. Electronically Signed   By: Darliss Cheney M.D.   On: 05/23/2022 16:58    Procedures Procedures    Medications Ordered in ED Medications  cefTRIAXone (ROCEPHIN) 2 g in sodium chloride 0.9 % 100 mL IVPB (has no administration in time range)  0.9 %  sodium chloride infusion (has no administration in time range)  sodium chloride 0.9 % bolus 1,000 mL (0 mLs Intravenous Stopped 05/23/22 1836)  ondansetron (ZOFRAN) injection 4 mg (4 mg Intravenous Given 05/23/22 1724)  morphine (PF) 4 MG/ML injection 4  mg (4 mg Intravenous Given 05/23/22 1725)    ED Course/ Medical Decision Making/ A&P                           Medical Decision Making Amount and/or Complexity of Data Reviewed Labs: ordered. Radiology: ordered.  Risk Prescription drug management. Decision regarding hospitalization.   41 y.o. female presents to the ED with complaints of abdominal pain, this involves an extensive number of treatment options, and is a complaint that carries with it a high risk of complications and morbidity.  The differential diagnosis includes cholecystitis, cholelithiasis, choledocholithiasis, pancreatitis, GERD, PUD, appendicitis, diverticulitis, UTI, pyelonephritis, nephrolithiasis, PID, ectopic pregnancy, ovarian torsion, STI, yeast infection, BV  On arrival pt is nontoxic, vitals WNL. Exam significant for focal right upper quadrant tenderness.  On pelvic exam patient had possible cystocele or bladder prolapse, small amount of urine discharge present, no erythema, no cervical motion tenderness, no adnexal tenderness or masses  Additional history obtained from sister at bedside. Previous records obtained and reviewed   I ordered medication IV  fluids, Zofran and morphine for symptomatic management and IV antibiotics  Lab Tests:  I Ordered, reviewed, and interpreted labs, which included: No leukocytosis and normal hemoglobin, no significant electrolyte derangements, AST and ALT are elevated in the 3-4 100s, T. bili of 1.8, normal lipase, wet prep with some WBCs but no yeast or clue cells, GC/chlamydia pending.  Patient in monogamous relationship, low risk for STI.  Imaging Studies ordered:  I ordered imaging studies which included right upper quadrant ultrasound recommended, I independently visualized and interpreted imaging which showed cholelithiasis, wall thickening, pericholecystic fluid and positive sonographic Murphy sign consistent with acute cholecystitis  ED Course:   I discussed ultrasound results with patient at bedside using Spanish interpreter, discussed plan for admission for surgery, pain currently well controlled, given IV antibiotics.  I consulted general surgery and discussed lab and imaging findings, Dr. Derrell Lolling accepts patient for transfer and admission to general surgery service for cholecystectomy.    Portions of this note were generated with Scientist, clinical (histocompatibility and immunogenetics). Dictation errors may occur despite best attempts at proofreading.         Final Clinical Impression(s) / ED Diagnoses Final diagnoses:  Acute cholecystitis    Rx / DC Orders ED Discharge Orders     None         Legrand Rams 05/23/22 Norberto Sorenson, MD 05/24/22 [phone]

## 2022-05-23 NOTE — ED Triage Notes (Signed)
Called x 1 no answeer

## 2022-05-23 NOTE — ED Notes (Signed)
Pt decided she wanted to leave didn't want to wait to be seen.

## 2022-05-23 NOTE — ED Triage Notes (Signed)
Pt from c/o of abdominal pain that started yesterday that radiates from her abdomen to her right back. Pt also c/o of urinary frequency with painful urination and bloating.

## 2022-05-23 NOTE — ED Notes (Signed)
Patient picked up by [facility] for transport to [facility].

## 2022-05-24 ENCOUNTER — Encounter (HOSPITAL_COMMUNITY): Payer: Self-pay

## 2022-05-24 ENCOUNTER — Encounter (HOSPITAL_COMMUNITY)
Admission: EM | Disposition: A | Discharge status: Home / Self Care | Payer: Self-pay | Source: Home / Self Care | Attending: Emergency Medicine

## 2022-05-24 ENCOUNTER — Other Ambulatory Visit: Payer: Self-pay

## 2022-05-24 ENCOUNTER — Other Ambulatory Visit (HOSPITAL_COMMUNITY): Payer: Self-pay

## 2022-05-24 ENCOUNTER — Observation Stay (HOSPITAL_COMMUNITY): Payer: Self-pay | Admitting: Anesthesiology

## 2022-05-24 ENCOUNTER — Observation Stay (HOSPITAL_BASED_OUTPATIENT_CLINIC_OR_DEPARTMENT_OTHER): Payer: Self-pay | Admitting: Anesthesiology

## 2022-05-24 DIAGNOSIS — K810 Acute cholecystitis: Secondary | ICD-10-CM

## 2022-05-24 HISTORY — PX: CHOLECYSTECTOMY: SHX55

## 2022-05-24 LAB — COMPREHENSIVE METABOLIC PANEL
ALT: 372 U/L — ABNORMAL HIGH (ref 0–44)
AST: 254 U/L — ABNORMAL HIGH (ref 15–41)
Albumin: 3.4 g/dL — ABNORMAL LOW (ref 3.5–5.0)
Alkaline Phosphatase: 103 U/L (ref 38–126)
Anion gap: 5 (ref 5–15)
BUN: 6 mg/dL (ref 6–20)
CO2: 25 mmol/L (ref 22–32)
Calcium: 8.5 mg/dL — ABNORMAL LOW (ref 8.9–10.3)
Chloride: 109 mmol/L (ref 98–111)
Creatinine, Ser: 0.67 mg/dL (ref 0.44–1.00)
GFR, Estimated: >60 mL/min (ref >60)
Glucose, Bld: 108 mg/dL — ABNORMAL HIGH (ref 70–99)
Potassium: 3.9 mmol/L (ref 3.5–5.1)
Sodium: 139 mmol/L (ref 135–145)
Total Bilirubin: 1.3 mg/dL — ABNORMAL HIGH (ref 0.3–1.2)
Total Protein: 6.1 g/dL — ABNORMAL LOW (ref 6.5–8.1)

## 2022-05-24 LAB — CBC
HCT: 36.4 % (ref 36.0–46.0)
Hemoglobin: 12.2 g/dL (ref 12.0–15.0)
MCH: 27.9 pg (ref 26.0–34.0)
MCHC: 33.5 g/dL (ref 30.0–36.0)
MCV: 83.3 fL (ref 80.0–100.0)
Platelets: 145 10*3/uL — ABNORMAL LOW (ref 150–400)
RBC: 4.37 MIL/uL (ref 3.87–5.11)
RDW: 14 % (ref 11.5–15.5)
WBC: 5.6 10*3/uL (ref 4.0–10.5)
nRBC: 0.4 % — ABNORMAL HIGH (ref 0.0–0.2)

## 2022-05-24 LAB — GC/CHLAMYDIA PROBE AMP (~~LOC~~) NOT AT ARMC
Chlamydia: NEGATIVE
Comment: NEGATIVE
Comment: NORMAL
Neisseria Gonorrhea: NEGATIVE

## 2022-05-24 LAB — SURGICAL PCR SCREEN
MRSA, PCR: NEGATIVE
Staphylococcus aureus: NEGATIVE

## 2022-05-24 SURGERY — LAPAROSCOPIC CHOLECYSTECTOMY
Anesthesia: General | Site: Abdomen

## 2022-05-24 MED ORDER — TRAMADOL HCL 50 MG PO TABS: 50.0000 mg | ORAL_TABLET | Freq: Four times a day (QID) | ORAL | Status: DC | PRN

## 2022-05-24 MED ORDER — ONDANSETRON HCL 4 MG/2ML IJ SOLN
INTRAMUSCULAR | Status: DC | PRN
  Administered 2022-05-24: 4 mg via INTRAVENOUS

## 2022-05-24 MED ORDER — ACETAMINOPHEN 10 MG/ML IV SOLN
INTRAVENOUS | Status: AC
  Filled 2022-05-24: qty 100

## 2022-05-24 MED ORDER — METHOCARBAMOL 750 MG PO TABS
750.0000 mg | ORAL_TABLET | Freq: Four times a day (QID) | ORAL | 1 refills | Status: DC
  Filled 2022-05-24: qty 120, 30d supply, fill #0

## 2022-05-24 MED ORDER — DOCUSATE SODIUM 100 MG PO CAPS
100.0000 mg | ORAL_CAPSULE | Freq: Two times a day (BID) | ORAL | 2 refills | Status: AC
  Filled 2022-05-24: qty 60, 30d supply, fill #0

## 2022-05-24 MED ORDER — ACETAMINOPHEN 160 MG/5ML PO SOLN: 325.0000 mg | ORAL | Status: DC | PRN

## 2022-05-24 MED ORDER — CHLORHEXIDINE GLUCONATE 0.12 % MT SOLN: 15.0000 mL | Freq: Once | OROMUCOSAL | Status: AC

## 2022-05-24 MED ORDER — 0.9 % SODIUM CHLORIDE (POUR BTL) OPTIME
TOPICAL | Status: DC | PRN
  Administered 2022-05-24: 1000 mL

## 2022-05-24 MED ORDER — EPHEDRINE SULFATE-NACL 50-0.9 MG/10ML-% IV SOSY
PREFILLED_SYRINGE | INTRAVENOUS | Status: DC | PRN
  Administered 2022-05-24 (×2): 2.5 mg via INTRAVENOUS
  Administered 2022-05-24: 5 mg via INTRAVENOUS

## 2022-05-24 MED ORDER — DIPHENHYDRAMINE HCL 25 MG PO CAPS: 25.0000 mg | ORAL_CAPSULE | Freq: Four times a day (QID) | ORAL | Status: DC | PRN

## 2022-05-24 MED ORDER — OXYCODONE HCL 5 MG PO TABS: 5.0000 mg | ORAL_TABLET | Freq: Once | ORAL | Status: DC | PRN

## 2022-05-24 MED ORDER — DEXAMETHASONE SODIUM PHOSPHATE 10 MG/ML IJ SOLN
INTRAMUSCULAR | Status: AC
Start: 2022-05-24 — End: ?
  Filled 2022-05-24: qty 1

## 2022-05-24 MED ORDER — FENTANYL CITRATE (PF) 250 MCG/5ML IJ SOLN
INTRAMUSCULAR | Status: DC | PRN
Start: 2022-05-24 — End: 2022-05-24
  Administered 2022-05-24: 100 ug via INTRAVENOUS
  Administered 2022-05-24: 50 ug via INTRAVENOUS

## 2022-05-24 MED ORDER — ROCURONIUM BROMIDE 10 MG/ML (PF) SYRINGE
PREFILLED_SYRINGE | INTRAVENOUS | Status: DC | PRN
  Administered 2022-05-24: 80 mg via INTRAVENOUS

## 2022-05-24 MED ORDER — ACETAMINOPHEN 500 MG PO TABS
1000.0000 mg | ORAL_TABLET | Freq: Four times a day (QID) | ORAL | Status: DC
  Administered 2022-05-24 – 2022-05-26 (×6): 1000 mg via ORAL
  Filled 2022-05-24 (×7): qty 2

## 2022-05-24 MED ORDER — FENTANYL CITRATE (PF) 100 MCG/2ML IJ SOLN
25.0000 ug | INTRAMUSCULAR | Status: DC | PRN
  Administered 2022-05-24 (×2): 50 ug via INTRAVENOUS

## 2022-05-24 MED ORDER — MEPERIDINE HCL 25 MG/ML IJ SOLN: 6.2500 mg | INTRAMUSCULAR | Status: DC | PRN

## 2022-05-24 MED ORDER — MIDAZOLAM HCL 2 MG/2ML IJ SOLN
INTRAMUSCULAR | Status: AC
  Filled 2022-05-24: qty 2

## 2022-05-24 MED ORDER — IBUPROFEN 600 MG PO TABS
600.0000 mg | ORAL_TABLET | Freq: Four times a day (QID) | ORAL | 1 refills | Status: DC
  Filled 2022-05-24: qty 120, 30d supply, fill #0

## 2022-05-24 MED ORDER — LACTATED RINGERS IV SOLN
INTRAVENOUS | Status: DC
Start: 2022-05-24 — End: 2022-05-24

## 2022-05-24 MED ORDER — ONDANSETRON HCL 4 MG/2ML IJ SOLN: 4.0000 mg | Freq: Once | INTRAMUSCULAR | Status: DC | PRN

## 2022-05-24 MED ORDER — POLYETHYLENE GLYCOL 3350 17 G PO PACK: 17.0000 g | PACK | Freq: Every day | ORAL | Status: DC | PRN

## 2022-05-24 MED ORDER — FENTANYL CITRATE (PF) 100 MCG/2ML IJ SOLN
INTRAMUSCULAR | Status: AC
  Filled 2022-05-24: qty 2

## 2022-05-24 MED ORDER — MORPHINE SULFATE (PF) 2 MG/ML IV SOLN
1.0000 mg | INTRAVENOUS | Status: DC | PRN
  Administered 2022-05-24: 4 mg via INTRAVENOUS
  Administered 2022-05-24 (×2): 2 mg via INTRAVENOUS
  Administered 2022-05-25: 4 mg via INTRAVENOUS
  Administered 2022-05-25: 2 mg via INTRAVENOUS
  Filled 2022-05-24: qty 1
  Filled 2022-05-24 (×2): qty 2
  Filled 2022-05-24: qty 1
  Filled 2022-05-24: qty 2

## 2022-05-24 MED ORDER — LIDOCAINE 2% (20 MG/ML) 5 ML SYRINGE
INTRAMUSCULAR | Status: DC | PRN
  Administered 2022-05-24: 60 mg via INTRAVENOUS

## 2022-05-24 MED ORDER — FENTANYL CITRATE (PF) 250 MCG/5ML IJ SOLN
INTRAMUSCULAR | Status: AC
  Filled 2022-05-24: qty 5

## 2022-05-24 MED ORDER — MORPHINE SULFATE (PF) 2 MG/ML IV SOLN: 1.0000 mg | INTRAVENOUS | Status: DC | PRN

## 2022-05-24 MED ORDER — SIMETHICONE 80 MG PO CHEW: 40.0000 mg | CHEWABLE_TABLET | Freq: Four times a day (QID) | ORAL | Status: DC | PRN

## 2022-05-24 MED ORDER — ROCURONIUM BROMIDE 10 MG/ML (PF) SYRINGE
PREFILLED_SYRINGE | INTRAVENOUS | Status: AC
  Filled 2022-05-24: qty 10

## 2022-05-24 MED ORDER — OXYCODONE HCL 5 MG PO TABS
5.0000 mg | ORAL_TABLET | ORAL | Status: DC | PRN
  Administered 2022-05-24: 10 mg via ORAL
  Administered 2022-05-25 – 2022-05-26 (×4): 5 mg via ORAL
  Filled 2022-05-24: qty 2
  Filled 2022-05-24 (×5): qty 1

## 2022-05-24 MED ORDER — MIDAZOLAM HCL 2 MG/2ML IJ SOLN
INTRAMUSCULAR | Status: DC | PRN
  Administered 2022-05-24: 2 mg via INTRAVENOUS

## 2022-05-24 MED ORDER — ACETAMINOPHEN 500 MG PO TABS
1000.0000 mg | ORAL_TABLET | Freq: Four times a day (QID) | ORAL | 3 refills | Status: AC
  Filled 2022-05-24: qty 120, 15d supply, fill #0

## 2022-05-24 MED ORDER — POTASSIUM CHLORIDE 2 MEQ/ML IV SOLN: INTRAVENOUS | Status: DC

## 2022-05-24 MED ORDER — OXYCODONE HCL 5 MG/5ML PO SOLN: 5.0000 mg | Freq: Once | ORAL | Status: DC | PRN

## 2022-05-24 MED ORDER — KETAMINE HCL 100 MG/ML IJ SOLN
INTRAMUSCULAR | Status: AC
  Filled 2022-05-24: qty 1

## 2022-05-24 MED ORDER — BUPIVACAINE HCL 0.25 % IJ SOLN
INTRAMUSCULAR | Status: DC | PRN
  Administered 2022-05-24: 30 mL

## 2022-05-24 MED ORDER — MELATONIN 3 MG PO TABS: 3.0000 mg | ORAL_TABLET | Freq: Every evening | ORAL | Status: DC | PRN

## 2022-05-24 MED ORDER — PROPOFOL 10 MG/ML IV BOLUS
INTRAVENOUS | Status: DC | PRN
  Administered 2022-05-24: 200 mg via INTRAVENOUS

## 2022-05-24 MED ORDER — KCL IN DEXTROSE-NACL 20-5-0.45 MEQ/L-%-% IV SOLN
INTRAVENOUS | Status: DC
  Filled 2022-05-24 (×2): qty 1000

## 2022-05-24 MED ORDER — SODIUM CHLORIDE 0.9 % IR SOLN
Status: DC | PRN
  Administered 2022-05-24: 1000 mL

## 2022-05-24 MED ORDER — DIPHENHYDRAMINE HCL 50 MG/ML IJ SOLN: 25.0000 mg | Freq: Four times a day (QID) | INTRAMUSCULAR | Status: DC | PRN

## 2022-05-24 MED ORDER — KETOROLAC TROMETHAMINE 30 MG/ML IJ SOLN
INTRAMUSCULAR | Status: AC
Start: 2022-05-24 — End: ?
  Filled 2022-05-24: qty 1

## 2022-05-24 MED ORDER — CEFTRIAXONE SODIUM 2 G IJ SOLR
2.0000 g | INTRAMUSCULAR | Status: AC
  Administered 2022-05-24: 2 g via INTRAVENOUS
  Filled 2022-05-24: qty 20

## 2022-05-24 MED ORDER — SUGAMMADEX SODIUM 200 MG/2ML IV SOLN
INTRAVENOUS | Status: DC | PRN
  Administered 2022-05-24: 300 mg via INTRAVENOUS

## 2022-05-24 MED ORDER — OXYCODONE HCL 5 MG PO TABS
5.0000 mg | ORAL_TABLET | ORAL | 0 refills | Status: DC | PRN
  Filled 2022-05-24: qty 30, 5d supply, fill #0

## 2022-05-24 MED ORDER — DEXAMETHASONE SODIUM PHOSPHATE 10 MG/ML IJ SOLN
INTRAMUSCULAR | Status: DC | PRN
  Administered 2022-05-24: 8 mg via INTRAVENOUS

## 2022-05-24 MED ORDER — ORAL CARE MOUTH RINSE: 15.0000 mL | Freq: Once | OROMUCOSAL | Status: AC

## 2022-05-24 MED ORDER — KETAMINE HCL 10 MG/ML IJ SOLN
INTRAMUSCULAR | Status: DC | PRN
  Administered 2022-05-24: 50 mg via INTRAVENOUS

## 2022-05-24 MED ORDER — ACETAMINOPHEN 10 MG/ML IV SOLN
INTRAVENOUS | Status: DC | PRN
  Administered 2022-05-24: 1000 mg via INTRAVENOUS

## 2022-05-24 MED ORDER — PHENYLEPHRINE 80 MCG/ML (10ML) SYRINGE FOR IV PUSH (FOR BLOOD PRESSURE SUPPORT)
PREFILLED_SYRINGE | INTRAVENOUS | Status: DC | PRN
  Administered 2022-05-24 (×2): 80 ug via INTRAVENOUS

## 2022-05-24 MED ORDER — ACETAMINOPHEN 325 MG PO TABS: 325.0000 mg | ORAL_TABLET | ORAL | Status: DC | PRN

## 2022-05-24 MED ORDER — CHLORHEXIDINE GLUCONATE 0.12 % MT SOLN
OROMUCOSAL | Status: AC
  Administered 2022-05-24: 15 mL via OROMUCOSAL
  Filled 2022-05-24: qty 15

## 2022-05-24 MED ORDER — ENOXAPARIN SODIUM 40 MG/0.4ML IJ SOSY
40.0000 mg | PREFILLED_SYRINGE | INTRAMUSCULAR | Status: DC
  Administered 2022-05-25 – 2022-05-26 (×2): 40 mg via SUBCUTANEOUS
  Filled 2022-05-24 (×2): qty 0.4

## 2022-05-24 SURGICAL SUPPLY — 42 items
APPLIER CLIP 5 13 M/L LIGAMAX5 (MISCELLANEOUS) ×1
BLADE CLIPPER SURG (BLADE) IMPLANT
CANISTER SUCT 3000ML PPV (MISCELLANEOUS) ×1 IMPLANT
CHLORAPREP W/TINT 26 (MISCELLANEOUS) ×1 IMPLANT
CLIP APPLIE 5 13 M/L LIGAMAX5 (MISCELLANEOUS) ×1 IMPLANT
COVER SURGICAL LIGHT HANDLE (MISCELLANEOUS) ×1 IMPLANT
DERMABOND ADVANCED (GAUZE/BANDAGES/DRESSINGS) ×1
DERMABOND ADVANCED .7 DNX12 (GAUZE/BANDAGES/DRESSINGS) ×1 IMPLANT
DISSECTOR BLUNT TIP ENDO 5MM (MISCELLANEOUS) IMPLANT
ELECT CAUTERY BLADE 6.4 (BLADE) ×1 IMPLANT
ELECT REM PT RETURN 9FT ADLT (ELECTROSURGICAL) ×1
ELECTRODE REM PT RTRN 9FT ADLT (ELECTROSURGICAL) ×1 IMPLANT
ENDOLOOP SUT PDS II  0 18 (SUTURE) ×1
ENDOLOOP SUT PDS II 0 18 (SUTURE) IMPLANT
GLOVE BIO SURGEON STRL SZ 6.5 (GLOVE) ×1 IMPLANT
GLOVE BIOGEL PI IND STRL 6 (GLOVE) ×1 IMPLANT
GLOVE BIOGEL PI INDICATOR 6 (GLOVE) ×1
GOWN STRL REUS W/ TWL LRG LVL3 (GOWN DISPOSABLE) ×3 IMPLANT
GOWN STRL REUS W/TWL LRG LVL3 (GOWN DISPOSABLE) ×3
HEMOSTAT SNOW SURGICEL 2X4 (HEMOSTASIS) IMPLANT
KIT BASIN OR (CUSTOM PROCEDURE TRAY) ×1 IMPLANT
KIT TURNOVER KIT B (KITS) ×1 IMPLANT
NS IRRIG 1000ML POUR BTL (IV SOLUTION) ×1 IMPLANT
PAD ARMBOARD 7.5X6 YLW CONV (MISCELLANEOUS) ×1 IMPLANT
PENCIL BUTTON HOLSTER BLD 10FT (ELECTRODE) ×1 IMPLANT
POUCH RETRIEVAL ECOSAC 10 (ENDOMECHANICALS) ×1 IMPLANT
POUCH RETRIEVAL ECOSAC 10MM (ENDOMECHANICALS) ×1
SCISSORS LAP 5X35 DISP (ENDOMECHANICALS) ×1 IMPLANT
SET IRRIG TUBING LAPAROSCOPIC (IRRIGATION / IRRIGATOR) IMPLANT
SET TUBE SMOKE EVAC HIGH FLOW (TUBING) ×1 IMPLANT
SLEEVE ENDOPATH XCEL 5M (ENDOMECHANICALS) ×2 IMPLANT
SLEEVE Z-THREAD 5X100MM (TROCAR) IMPLANT
SUT MNCRL AB 4-0 PS2 18 (SUTURE) ×1 IMPLANT
SUT VIC AB 0 UR5 27 (SUTURE) IMPLANT
SUT VICRYL 0 AB UR-6 (SUTURE) IMPLANT
TOWEL GREEN STERILE FF (TOWEL DISPOSABLE) ×1 IMPLANT
TRAY LAPAROSCOPIC MC (CUSTOM PROCEDURE TRAY) ×1 IMPLANT
TROCAR BALLN 12MMX100 BLUNT (TROCAR) IMPLANT
TROCAR XCEL BLUNT TIP 100MML (ENDOMECHANICALS) ×1 IMPLANT
TROCAR Z-THREAD OPTICAL 5X100M (TROCAR) ×1 IMPLANT
WARMER LAPAROSCOPE (MISCELLANEOUS) ×1 IMPLANT
WATER STERILE IRR 1000ML POUR (IV SOLUTION) ×1 IMPLANT

## 2022-05-24 NOTE — Discharge Instructions (Addendum)
CIRUGIA LAPAROSCOPICA: INSTRUCCIONES DE POST OPERATORIO.  Revise siempre los documentos que le entreguen en el lugar donde se ha hecho la cirugia.  SI USTED NECESITA DOCUMENTOS DE INCAPACIDAD (DISABLE) O DE PERMISO FAMILAR (FAMILY LEAVE) NECESITA TRAERLOS A LA OFICINA PARA QUE SEAN PROCESADOS. NO  SE LOS DE A SU DOCTOR. A su alta del hospital se le dara una receta para controlar el dolor. Tomela como ha sido recetada, si la necesita. Si no la necesita puede tomar, Acetaminofen (Tylenol) o Ibuprofen (Advil) para aliviar dolor moderado. Continue tomando el resto de sus medicinas. Si necesita rellenar la receta, llame a la farmacia. ellos contactan a nuestra oficina pidiendo autorizacion. Este tipo de receta no pueden ser rellenadas despues de las  5pm o durante los fines de semana. Con relacion a la dieta: debe ser ligera los primeros dias despues que llege a la casa. Ejemplo: sopas y galleticas. Tome bastante liquido esos dias. La mayoria de los pacientes padecen de inflamacion y cambio de coloracion de la piel alrededor de las incisiones. esto toma dias en resolver.  pnerse una bolsa de hielo en el area affectada ayuda..  Es comun tambien tener un poco de estrenimiento si esta tomado medicinas para el dolor. incremente la cantidad de liquidos a tomar y puede tomar (Colace) esto previene el problema. Si ya tiene estrenimiento, es decir no ha defecado en 48 horas, puede tomar un laxativo (Milk of Magnesia or Miralax) uselo como el paquete le explica.  A menos que se le diga algo diferente. Remueva el bendaje a las 24-48 horas despues dela cirugia. y puede banarse en la ducha sin ningun problema. usted puede tener steri-strips (pequenas curitas transparentes en la piel puesta encima de la incision)  Estas banditas strips should be left on the skin for 7-10 days.   Si su cirujano puso pegamento encima de la incision usted puede banarse bajo la ducha en 24 horas. Este pegamento empezara a caerse en las  proximas 2-3 semanas. Si le pusieron suturas o presillas (grapos) estos seran quitados en su proxima cita en la oficina. . ACTIVIDADES:  Puede hacer actividad ligera.  Como caminar , subir escaleras y poco a poco irlas incrementando tanto como las tolere. Puede tener relaciones sexuales cuando sea comfortable. No carge objetos pesados o haga esfuerzos que no sean aprovados por su doctor. Puede manejar en cuanto no esta tomando medicamentos fuertes (narcoticos) para el dolor, pueda abrochar confortablemente el cinturon de seguridad, y pueda maniobrar y usar los pedales de su vehiculo con seguridad. PUEDE REGRESAR A TRABAJAR  Debe ver a su doctor para una cita de seguimiento en 2-3 semanas despues de la cirugia.  OTRAS ISNSTRUCCIONES:___________________________________________________________________________________ CUANDO LLAMAR A SU MEDICO: FIEBRE mayor de  101.0 No produccion de orina. Sangramiento continue de la herida Incremento de dolor, enrojecimientio o drenaje de la herida (incision) Incremento de dolor abdominal.  The clinic staff is available to answer your questions during regular business hours.  Please don't hesitate to call and ask to speak to one of the nurses for clinical concerns.  If you have a medical emergency, go to the nearest emergency room or call 911.  A surgeon from [facility] is always on call at the hospital. [address], [address], [place], [place]  [address] ? [address] [address], [place], [place]   [address] [phone] ? [phone] ? FAX [phone] Web site: [email or web address]    Managing Your Pain After Surgery Without Opioids    Thank you for participating in   our program to help patients manage their pain after surgery without opioids. This is part of our effort to provide you with the best care possible, without exposing you or your family to the risk that opioids pose.  What pain can I expect after surgery? You can expect  to have some pain after surgery. This is normal. The pain is typically worse the day after surgery, and quickly begins to get better. Many studies have found that many patients are able to manage their pain after surgery with Over-the-Counter (OTC) medications such as Tylenol and Motrin. If you have a condition that does not allow you to take Tylenol or Motrin, notify your surgical team.  How will I manage my pain? The best strategy for controlling your pain after surgery is around the clock pain control with Tylenol (acetaminophen) and Motrin (ibuprofen or Advil). Alternating these medications with each other allows you to maximize your pain control. In addition to Tylenol and Motrin, you can use heating pads or ice packs on your incisions to help reduce your pain.  How will I alternate your regular strength over-the-counter pain medication? You will take a dose of pain medication every three hours. Start by taking 650 mg of Tylenol (2 pills of 325 mg) 3 hours later take 600 mg of Motrin (3 pills of 200 mg) 3 hours after taking the Motrin take 650 mg of Tylenol 3 hours after that take 600 mg of Motrin.   - 1 -  See example - if your first dose of Tylenol is at 12:00 PM   12:00 PM Tylenol 650 mg (2 pills of 325 mg)  3:00 PM Motrin 600 mg (3 pills of 200 mg)  6:00 PM Tylenol 650 mg (2 pills of 325 mg)  9:00 PM Motrin 600 mg (3 pills of 200 mg)  Continue alternating every 3 hours   We recommend that you follow this schedule around-the-clock for at least 3 days after surgery, or until you feel that it is no longer needed. Use the table on the last page of this handout to keep track of the medications you are taking. Important: Do not take more than 3000mg of Tylenol or 3200mg of Motrin in a 24-hour period. Do not take ibuprofen/Motrin if you have a history of bleeding stomach ulcers, severe kidney disease, &/or actively taking a blood thinner  What if I still have pain? If you have pain  that is not controlled with the over-the-counter pain medications (Tylenol and Motrin or Advil) you might have what we call "breakthrough" pain. You will receive a prescription for a small amount of an opioid pain medication such as Oxycodone, Tramadol, or Tylenol with Codeine. Use these opioid pills in the first 24 hours after surgery if you have breakthrough pain. Do not take more than 1 pill every 4-6 hours.  If you still have uncontrolled pain after using all opioid pills, don't hesitate to call our staff using the number provided. We will help make sure you are managing your pain in the best way possible, and if necessary, we can provide a prescription for additional pain medication.   Day 1    Time  Name of Medication Number of pills taken  Amount of Acetaminophen  Pain Level   Comments  AM PM       AM PM       AM PM       AM PM       AM PM         AM PM       AM PM       AM PM       Total Daily amount of Acetaminophen Do not take more than  3,000 mg per day      Day 2    Time  Name of Medication Number of pills taken  Amount of Acetaminophen  Pain Level   Comments  AM PM       AM PM       AM PM       AM PM       AM PM       AM PM       AM PM       AM PM       Total Daily amount of Acetaminophen Do not take more than  3,000 mg per day      Day 3    Time  Name of Medication Number of pills taken  Amount of Acetaminophen  Pain Level   Comments  AM PM       AM PM       AM PM       AM PM          AM PM       AM PM       AM PM       AM PM       Total Daily amount of Acetaminophen Do not take more than  3,000 mg per day      Day 4    Time  Name of Medication Number of pills taken  Amount of Acetaminophen  Pain Level   Comments  AM PM       AM PM       AM PM       AM PM       AM PM       AM PM       AM PM       AM PM       Total Daily amount of Acetaminophen Do not take more than  3,000 mg per day      Day 5    Time  Name of  Medication Number of pills taken  Amount of Acetaminophen  Pain Level   Comments  AM PM       AM PM       AM PM       AM PM       AM PM       AM PM       AM PM       AM PM       Total Daily amount of Acetaminophen Do not take more than  3,000 mg per day       Day 6    Time  Name of Medication Number of pills taken  Amount of Acetaminophen  Pain Level  Comments  AM PM       AM PM       AM PM       AM PM       AM PM       AM PM       AM PM       AM PM       Total Daily amount of Acetaminophen Do not take more than  3,000 mg per day      Day 7    Time    Name of Medication Number of pills taken  Amount of Acetaminophen  Pain Level   Comments  AM PM       AM PM       AM PM       AM PM       AM PM       AM PM       AM PM       AM PM       Total Daily amount of Acetaminophen Do not take more than  3,000 mg per day        For additional information about how and where to safely dispose of unused opioid medications - https://www.morepowerfulnc.org  Disclaimer: This document contains information and/or instructional materials adapted from [facility] for the typical patient with your condition. It does not replace medical advice from your health care provider because your experience may differ from that of the typical patient. Talk to your health care provider if you have any questions about this document, your condition or your treatment plan. Adapted from [facility]  

## 2022-05-24 NOTE — Op Note (Signed)
   Operative Note  Date: 05/24/2022  Procedure: laparoscopic cholecystectomy  Pre-op diagnosis: acute cholecystitis Post-op diagnosis: same  Indication and clinical history: The patient is a 41 y.o. year old female with acute cholecystitis  Surgeon: Diamantina Monks, MD  Anesthesiologist: Tacy Dura, MD Anesthesia: General  Findings:  Specimen: gallbladder EBL: <5cc Drains/Implants: surgicel snow  Disposition: PACU - hemodynamically stable.  Description of procedure: The patient was positioned supine on the operating room table. Time-out was performed verifying correct patient, procedure, signature of informed consent, and administration of pre-operative antibiotics. General anesthetic induction and intubation were uneventful. The abdomen was prepped and draped in the usual sterile fashion. An infra-umbilical incision was made using an open technique using zero vicryl stay sutures on either side of the fascia and a 73mm Hassan port inserted. After establishing pneumoperitoneum, which the patient tolerated well, the abdominal cavity was inspected and no injury of any intra-abdominal structures was identified. Additional ports were placed under direct visualization and using local anesthetic: two 55mm ports in the right subcostal region and a 45mm port in the epigastric region. The patient was re-positioned to reverse Trendelenburg and right side up. Adhesiolysis was performed to expose the gallbladder, which was then retracted cephalad. The infundibulum was identified and retracted toward the right lower quadrant. The peritoneum was incised over the infundibulum and the triangle of Calot dissected to expose the critical view of safety. With clear identification and isolation of the cystic duct and cystic artery, the cystic artery was doubly clipped and divided. After this, the cystic duct was identified as a single structure entering the gallbladder, and was also doubly clipped and divided. An  endoloop was used to secure the cystic duct stump as well. The gallbladder was dissected off the liver bed using electrocautery and hemostasis of the liver bed was confirmed prior to separation of the final peritoneal attachments of the gallbladder to the liver bed. The gallbladder fossa was irrigated and fluid returned clear. After transection of the final peritoneal attachments, the gallbladder was placed in an endoscopic specimen retrieval bag, removed via the umbilical port site, and sent to pathology as a permanent specimen. The gallbladder fossa was inspected confirming hemostasis, the absence of bile leakage from the cystic duct stump, and correct placement of clips on the cystic artery and cystic duct stumps. A pice of surgicel snow was placed in the gallbladder fossa. The abdomen was desufflated and the fascia of the umbilical port site was closed using the previously placed stay sutures. Additional local anesthetic was administered at the umbilical port site.  The skin of all incisions was closed with 4-0 monocryl. Sterile dressings were applied. All sponge and instrument counts were correct at the conclusion of the procedure. The patient was awakened from anesthesia, extubated uneventfully, and transported to the PACU - hemodynamically stable.. There were no complications.    Upon entering the abdomen (organ space), I encountered infection of the gallbladder .  CASE DATA:  Type of patient?: DOW CASE (Surgical Hospitalist [facility])  Status of Case? URGENT Add On  Infection Present At Time Of Surgery (PATOS)?  INFECTION of the gallbladder    Diamantina Monks, MD General and Trauma Surgery [facility]

## 2022-05-24 NOTE — H&P (Signed)
[name] Li 1981-01-27  [id number].    Chief Complaint/Reason for Consult: cholecystitis  HPI:  This is a 41 yo Hispanic female with a history of MS who has had 4 previous episodes of abdominal pain in her RUQ that have resolved on their own.  Yesterday am at 0300, she was awoke from sleep with RUQ abdominal pain.  She denies any N/V/D/fevers, CP, SOB, dysuria, etc.  Her pain persisted throughout the day and she came to the ED.  Her pain has improved this morning, but she was found to have cholecystitis on abdominal US.  She has a normal WBC but elevated LFTs, which are stable to slightly downtrending today.  She will be admitted for lap chole.  ROS: ROS: see HPI  Family History  Problem Relation Age of Onset   Mental illness Mother        depression and OCD - sees psychiatrist   Multiple births Mother        pregnant with twins but lost them   Diabetes Father    Heart disease Father    Diabetes Brother    Thyroid disease Brother     Past Medical History:  Diagnosis Date   Depression    Gestational diabetes    glyburide   Multiple sclerosis (HCC)    Nausea and vomiting during pregnancy 04/12/2021    Past Surgical History:  Procedure Laterality Date   NO PAST SURGERIES      Social History:  reports that she has never smoked. She has never used smokeless tobacco. She reports that she does not drink alcohol and does not use drugs.  Allergies: No Known Allergies  Medications Prior to Admission  Medication Sig Dispense Refill   natalizumab (TYSABRI) 300 MG/15ML injection Inject into the vein.     Prenatal Vit-Fe Fumarate-FA (PRENATAL MULTIVITAMIN) TABS tablet Take 1 tablet by mouth daily at 12 noon.       Physical Exam: Blood pressure 113/69, pulse (!) 54, temperature 97.8 F (36.6 C), temperature source Oral, resp. rate 17, height 5\' 6"  (1.676 m), weight 104.3 kg, last menstrual period 05/08/2022, SpO2 100 %, not currently breastfeeding. General:  pleasant, WD, WN Hispanic female who is laying in bed in NAD HEENT: head is normocephalic, atraumatic.  Sclera are noninjected.  PERRL.  Ears and nose without any masses or lesions.  Mouth is pink and moist Heart: regular, rate, and rhythm.  Normal s1,s2. No obvious murmurs, gallops, or rubs noted.  Palpable radial and pedal pulses bilaterally Lungs: CTAB, no wheezes, rhonchi, or rales noted.  Respiratory effort nonlabored Abd: soft, mildly tender in RUQ and epigastrium, ND, +BS, no masses, hernias, or organomegaly MS: all 4 extremities are symmetrical with no cyanosis, clubbing, or edema. Skin: warm and dry with no masses, lesions, or rashes Neuro: Cranial nerves 2-12 grossly intact, sensation is normal throughout Psych: A&Ox3 with an appropriate affect.   Results for orders placed or performed during the hospital encounter of 05/23/22 (from the past 48 hour(s))  Urinalysis, Routine w reflex microscopic Urine, Clean Catch     Status: Abnormal   Collection Time: 05/23/22  1:05 PM  Result Value Ref Range   Color, Urine YELLOW YELLOW   APPearance CLEAR CLEAR   Specific Gravity, Urine 1.009 1.005 - 1.030   pH 6.5 5.0 - 8.0   Glucose, UA NEGATIVE NEGATIVE mg/dL   Hgb urine dipstick NEGATIVE NEGATIVE   Bilirubin Urine NEGATIVE NEGATIVE   Ketones, ur NEGATIVE NEGATIVE mg/dL  Protein, ur NEGATIVE NEGATIVE mg/dL   Nitrite NEGATIVE NEGATIVE   Leukocytes,Ua SMALL (A) NEGATIVE   RBC / HPF 0-5 0 - 5 RBC/hpf   WBC, UA 0-5 0 - 5 WBC/hpf   Squamous Epithelial / LPF 0-5 0 - 5   Mucus PRESENT     Comment: Performed at [facility], [address] [place], [place] [address]  Pregnancy, urine     Status: None   Collection Time: 05/23/22  1:05 PM  Result Value Ref Range   Preg Test, Ur NEGATIVE NEGATIVE    Comment:        THE SENSITIVITY OF THIS METHODOLOGY IS >20 mIU/mL. Performed at [facility], [address] [place], [place] [address]   CBC with  Differential     Status: None   Collection Time: 05/23/22  3:05 PM  Result Value Ref Range   WBC 7.1 4.0 - 10.5 K/uL   RBC 4.85 3.87 - 5.11 MIL/uL   Hemoglobin 13.2 12.0 - 15.0 g/dL   HCT 44.0 34.7 - 42.5 %   MCV 83.7 80.0 - 100.0 fL   MCH 27.2 26.0 - 34.0 pg   MCHC 32.5 30.0 - 36.0 g/dL   RDW 95.6 38.7 - 56.4 %   Platelets 168 150 - 400 K/uL   nRBC 0.0 0.0 - 0.2 %   Neutrophils Relative % 63 %   Neutro Abs 4.5 1.7 - 7.7 K/uL   Lymphocytes Relative 30 %   Lymphs Abs 2.2 0.7 - 4.0 K/uL   Monocytes Relative 6 %   Monocytes Absolute 0.4 0.1 - 1.0 K/uL   Eosinophils Relative 1 %   Eosinophils Absolute 0.1 0.0 - 0.5 K/uL   Basophils Relative 0 %   Basophils Absolute 0.0 0.0 - 0.1 K/uL   Immature Granulocytes 0 %   Abs Immature Granulocytes 0.02 0.00 - 0.07 K/uL    Comment: Performed at [facility], [address] [place], [place] [address]  Comprehensive metabolic panel     Status: Abnormal   Collection Time: 05/23/22  3:05 PM  Result Value Ref Range   Sodium 139 135 - 145 mmol/L   Potassium 3.9 3.5 - 5.1 mmol/L   Chloride 105 98 - 111 mmol/L   CO2 24 22 - 32 mmol/L   Glucose, Bld 105 (H) 70 - 99 mg/dL    Comment: Glucose reference range applies only to samples taken after fasting for at least 8 hours.   BUN 8 6 - 20 mg/dL   Creatinine, Ser 1.88 0.44 - 1.00 mg/dL   Calcium 41.6 8.9 - 60.6 mg/dL   Total Protein 7.6 6.5 - 8.1 g/dL   Albumin 4.7 3.5 - 5.0 g/dL   AST 301 (H) 15 - 41 U/L   ALT 377 (H) 0 - 44 U/L   Alkaline Phosphatase 89 38 - 126 U/L   Total Bilirubin 1.8 (H) 0.3 - 1.2 mg/dL   GFR, Estimated >60 >10 mL/min    Comment: (NOTE) Calculated using the CKD-EPI Creatinine Equation (2021)    Anion gap 10 5 - 15    Comment: Performed at [facility], [address] [place], [place] [address]  Lipase, blood     Status: None   Collection Time: 05/23/22  3:05 PM  Result Value Ref Range   Lipase 20 11 - 51 U/L    Comment:  Performed at [facility], [address] [place], [place] [address]  Wet prep, genital  Status: Abnormal   Collection Time: 05/23/22  5:07 PM   Specimen: PATH Cytology Cervicovaginal Ancillary Only  Result Value Ref Range   Yeast Wet Prep HPF POC NONE SEEN NONE SEEN   Trich, Wet Prep NONE SEEN NONE SEEN   Clue Cells Wet Prep HPF POC NONE SEEN NONE SEEN   WBC, Wet Prep HPF POC >=10 (A) <10   Sperm NONE SEEN     Comment: Performed at [facility], [address] [place], [place] [address]  Surgical pcr screen     Status: None   Collection Time: 05/24/22  1:44 AM   Specimen: Nasal Mucosa; Nasal Swab  Result Value Ref Range   MRSA, PCR NEGATIVE NEGATIVE   Staphylococcus aureus NEGATIVE NEGATIVE    Comment: (NOTE) The Xpert SA Assay (FDA approved for NASAL specimens in patients 8 years of age and older), is one component of a comprehensive surveillance program. It is not intended to diagnose infection nor to guide or monitor treatment. Performed at [facility], [address], [place], [place] [address]   CBC     Status: Abnormal   Collection Time: 05/24/22  6:06 AM  Result Value Ref Range   WBC 5.6 4.0 - 10.5 K/uL   RBC 4.37 3.87 - 5.11 MIL/uL   Hemoglobin 12.2 12.0 - 15.0 g/dL   HCT 57.8 46.9 - 62.9 %   MCV 83.3 80.0 - 100.0 fL   MCH 27.9 26.0 - 34.0 pg   MCHC 33.5 30.0 - 36.0 g/dL   RDW 52.8 41.3 - 24.4 %   Platelets 145 (L) 150 - 400 K/uL   nRBC 0.4 (H) 0.0 - 0.2 %    Comment: Performed at [facility], [address] [place], [place] [address]  Comprehensive metabolic panel     Status: Abnormal   Collection Time: 05/24/22  6:06 AM  Result Value Ref Range   Sodium 139 135 - 145 mmol/L   Potassium 3.9 3.5 - 5.1 mmol/L   Chloride 109 98 - 111 mmol/L   CO2 25 22 - 32 mmol/L   Glucose, Bld 108 (H) 70 - 99 mg/dL    Comment: Glucose reference range applies only to samples taken after fasting for at least 8 hours.    BUN 6 6 - 20 mg/dL   Creatinine, Ser 2.53 0.44 - 1.00 mg/dL   Calcium 8.5 (L) 8.9 - 10.3 mg/dL   Total Protein 6.1 (L) 6.5 - 8.1 g/dL   Albumin 3.4 (L) 3.5 - 5.0 g/dL   AST 664 (H) 15 - 41 U/L   ALT 372 (H) 0 - 44 U/L   Alkaline Phosphatase 103 38 - 126 U/L   Total Bilirubin 1.3 (H) 0.3 - 1.2 mg/dL   GFR, Estimated >40 >34 mL/min    Comment: (NOTE) Calculated using the CKD-EPI Creatinine Equation (2021)    Anion gap 5 5 - 15    Comment: Performed at [facility], [address], [place], [place] [address]   US Abdomen Limited RUQ (LIVER/GB)  Result Date: 05/23/2022 CLINICAL DATA:  Right upper quadrant pain EXAM: ULTRASOUND ABDOMEN LIMITED RIGHT UPPER QUADRANT COMPARISON:  Abdominal ultrasound 05/07/2021 FINDINGS: Gallbladder: Gallstones are present measuring up to 7 mm. There is gallbladder wall thickening measuring 3.8 mm. The gallbladder is dilated. There is a small amount of pericholecystic fluid. Sonographic Eulah Pont sign is positive per sonographer. Common bile duct: Diameter: 3 mm Liver: No focal lesion identified. Increase in parenchymal echogenicity.  Portal vein is patent on color Doppler imaging with normal direction of blood flow towards the liver. Other: None. IMPRESSION: 1. Cholelithiasis, dilated gallbladder, wall thickening and pericholecystic fluid with positive sonographic Murphy sign highly concerning for acute cholecystitis. 2. Echogenic liver likely related to diffuse fatty infiltration. Electronically Signed   By: Darliss Cheney M.D.   On: 05/23/2022 16:58      Assessment/Plan Cholecystitis The patient has been seen, examined, labs, chart, vitals, and imaging personally reviewed.  She appears to have cholecystitis with some elevation in her LFTs.  TB was 1.8 on admit and down to 1.3 today.  We will plan to perform an IOC during surgery to better delineate if she has any evidence of choledocholithiasis.  She will remain NPO, Rocephin, IVFs, etc.  I have explained the  procedure, risks, and aftercare of cholecystectomy.  Risks include but are not limited to bleeding, infection, wound problems, anesthesia, diarrhea, bile leak, injury to common bile duct/liver/intestine.  She seems to understand and agrees to proceed.  FEN - NPO/IVFs VTE - lovenox to follow OR ID - Rocephin Admit - obs  MS  I reviewed nursing notes, ED provider notes, last 24 h vitals and pain scores, last 48 h intake and output, last 24 h labs and trends, and last 24 h imaging results.  Letha Cape, [facility] 05/24/2022, 9:11 AM Please see Amion for pager number during day hours 7:00am-4:30pm or 7:00am -11:30am on weekends

## 2022-05-24 NOTE — Progress Notes (Addendum)
Dr Derrell Lolling paged to inform him as per order about pt admission on the unit. Will allow patient ice chip for now but Npo after midnight for possible surgery tomorrow. No further changes. Patient informed understood same.

## 2022-05-24 NOTE — Transfer of Care (Signed)
Immediate Anesthesia Transfer of Care Note  Patient: [name] Li  Procedure(s) Performed: LAPAROSCOPIC CHOLECYSTECTOMY (Abdomen)  Patient Location: PACU  Anesthesia Type:General  Level of Consciousness: awake and drowsy  Airway & Oxygen Therapy: Patient Spontanous Breathing  Post-op Assessment: Report given to RN and Post -op Vital signs reviewed and stable  Post vital signs: Reviewed and stable  Last Vitals:  Vitals Value Taken Time  BP 134/72 05/24/22 1426  Temp    Pulse 96 05/24/22 1428  Resp 16 05/24/22 1428  SpO2 95 % 05/24/22 1428  Vitals shown include unvalidated device data.  Last Pain:  Vitals:   05/24/22 1217  TempSrc: Oral  PainSc:          Complications: No notable events documented.

## 2022-05-24 NOTE — Anesthesia Postprocedure Evaluation (Signed)
Anesthesia Post Note  Patient: [name] Li  Procedure(s) Performed: LAPAROSCOPIC CHOLECYSTECTOMY (Abdomen)     Patient location during evaluation: PACU Anesthesia Type: General Level of consciousness: awake and alert, oriented and patient cooperative Pain management: pain level controlled Vital Signs Assessment: post-procedure vital signs reviewed and stable Respiratory status: spontaneous breathing, nonlabored ventilation and respiratory function stable Cardiovascular status: blood pressure returned to baseline and stable Postop Assessment: no apparent nausea or vomiting Anesthetic complications: no   No notable events documented.  Last Vitals:  Vitals:   05/24/22 1455 05/24/22 1510  BP: 122/76 118/79  Pulse: 78 69  Resp: 12 12  Temp:    SpO2: 100% 100%    Last Pain:  Vitals:   05/24/22 1510  TempSrc:   PainSc: Asleep                 Lannie Fields

## 2022-05-24 NOTE — Anesthesia Procedure Notes (Signed)
Procedure Name: Intubation Date/Time: 05/24/2022 1:04 PM  Performed by: Erick Colace, CRNAPre-anesthesia Checklist: Patient identified, Emergency Drugs available, Suction available and Patient being monitored Patient Re-evaluated:Patient Re-evaluated prior to induction Oxygen Delivery Method: Circle system utilized Preoxygenation: Pre-oxygenation with 100% oxygen Induction Type: IV induction Ventilation: Mask ventilation without difficulty Laryngoscope Size: Mac and 4 Grade View: Grade I Tube type: Oral Number of attempts: 1 Airway Equipment and Method: Stylet and Oral airway Placement Confirmation: ETT inserted through vocal cords under direct vision, positive ETCO2 and breath sounds checked- equal and bilateral Secured at: 21 cm Tube secured with: Tape Dental Injury: Teeth and Oropharynx as per pre-operative assessment

## 2022-05-24 NOTE — Anesthesia Preprocedure Evaluation (Addendum)
Anesthesia Evaluation  Patient identified by MRN, date of birth, ID band Patient awake    Reviewed: Allergy & Precautions, H&P , NPO status , Patient's Chart, lab work & pertinent test results  Airway Mallampati: I  TM Distance: >3 FB Neck ROM: Full    Dental no notable dental hx. (+) Teeth Intact, Dental Advisory Given   Pulmonary neg pulmonary ROS,    Pulmonary exam normal breath sounds clear to auscultation       Cardiovascular Exercise Tolerance: Good hypertension, Pt. on medications negative cardio ROS Normal cardiovascular exam Rhythm:Regular Rate:Normal     Neuro/Psych PSYCHIATRIC DISORDERS Anxiety Depression Multiple sclerosis   Neuromuscular disease negative neurological ROS  negative psych ROS   GI/Hepatic negative GI ROS, Neg liver ROS,   Endo/Other  negative endocrine ROSdiabetesMorbid obesity  Renal/GU negative Renal ROS  negative genitourinary   Musculoskeletal negative musculoskeletal ROS (+)   Abdominal   Peds negative pediatric ROS (+)  Hematology negative hematology ROS (+)   Anesthesia Other Findings   Reproductive/Obstetrics negative OB ROS                            Anesthesia Physical Anesthesia Plan  ASA: 2 and emergent  Anesthesia Plan: General   Post-op Pain Management: Toradol IV (intra-op)* and Ofirmev IV (intra-op)*   Induction: Intravenous and Cricoid pressure planned  PONV Risk Score and Plan: 3 and Ondansetron, Dexamethasone and Treatment may vary due to age or medical condition  Airway Management Planned: Oral ETT  Additional Equipment: None  Intra-op Plan:   Post-operative Plan: Extubation in OR  Informed Consent: I have reviewed the patients History and Physical, chart, labs and discussed the procedure including the risks, benefits and alternatives for the proposed anesthesia with the patient or authorized representative who has indicated  his/her understanding and acceptance.       Plan Discussed with: Anesthesiologist and CRNA  Anesthesia Plan Comments: (  )       Anesthesia Quick Evaluation

## 2022-05-25 ENCOUNTER — Encounter (HOSPITAL_COMMUNITY): Payer: Self-pay | Admitting: Surgery

## 2022-05-25 LAB — COMPREHENSIVE METABOLIC PANEL
ALT: 234 U/L — ABNORMAL HIGH (ref 0–44)
AST: 78 U/L — ABNORMAL HIGH (ref 15–41)
Albumin: 3.3 g/dL — ABNORMAL LOW (ref 3.5–5.0)
Alkaline Phosphatase: 88 U/L (ref 38–126)
Anion gap: 5 (ref 5–15)
BUN: <5 mg/dL — ABNORMAL LOW (ref 6–20)
CO2: 26 mmol/L (ref 22–32)
Calcium: 8.6 mg/dL — ABNORMAL LOW (ref 8.9–10.3)
Chloride: 107 mmol/L (ref 98–111)
Creatinine, Ser: 0.72 mg/dL (ref 0.44–1.00)
GFR, Estimated: >60 mL/min (ref >60)
Glucose, Bld: 129 mg/dL — ABNORMAL HIGH (ref 70–99)
Potassium: 4 mmol/L (ref 3.5–5.1)
Sodium: 138 mmol/L (ref 135–145)
Total Bilirubin: 0.9 mg/dL (ref 0.3–1.2)
Total Protein: 6.1 g/dL — ABNORMAL LOW (ref 6.5–8.1)

## 2022-05-25 LAB — CBC
HCT: 37 % (ref 36.0–46.0)
Hemoglobin: 12.2 g/dL (ref 12.0–15.0)
MCH: 27.4 pg (ref 26.0–34.0)
MCHC: 33 g/dL (ref 30.0–36.0)
MCV: 83.1 fL (ref 80.0–100.0)
Platelets: 175 10*3/uL (ref 150–400)
RBC: 4.45 MIL/uL (ref 3.87–5.11)
RDW: 14.3 % (ref 11.5–15.5)
WBC: 11.5 10*3/uL — ABNORMAL HIGH (ref 4.0–10.5)
nRBC: 0.2 % (ref 0.0–0.2)

## 2022-05-25 LAB — SURGICAL PATHOLOGY

## 2022-05-25 MED ORDER — IBUPROFEN 200 MG PO TABS
600.0000 mg | ORAL_TABLET | Freq: Three times a day (TID) | ORAL | Status: DC
  Administered 2022-05-25 – 2022-05-26 (×4): 600 mg via ORAL
  Filled 2022-05-25 (×4): qty 3

## 2022-05-25 MED ORDER — METHOCARBAMOL 500 MG PO TABS
500.0000 mg | ORAL_TABLET | Freq: Once | ORAL | Status: AC
  Administered 2022-05-25: 500 mg via ORAL
  Filled 2022-05-25: qty 1

## 2022-05-25 MED ORDER — METHOCARBAMOL 500 MG PO TABS
1000.0000 mg | ORAL_TABLET | Freq: Three times a day (TID) | ORAL | Status: DC
  Administered 2022-05-25 – 2022-05-26 (×3): 1000 mg via ORAL
  Filled 2022-05-25 (×3): qty 2

## 2022-05-25 MED ORDER — METHOCARBAMOL 500 MG PO TABS
500.0000 mg | ORAL_TABLET | Freq: Three times a day (TID) | ORAL | Status: DC
  Administered 2022-05-25: 500 mg via ORAL
  Filled 2022-05-25: qty 1

## 2022-05-25 MED ORDER — MORPHINE SULFATE (PF) 2 MG/ML IV SOLN: 1.0000 mg | INTRAVENOUS | Status: DC | PRN

## 2022-05-25 NOTE — TOC Progression Note (Signed)
Transition of Care [facility]) - Progression Note    Patient Details  Name: [name] Li MRN: [id number] Date of Birth: [birth year]  Transition of Care [facility]) CM/SW [place], RN Phone Number: 05/25/2022, 10:25 AM  Clinical Narrative:    CM met with the patient and sister at bedside to discuss transitions of care to home.  The patient with no insurance - medication assistance provided through [facility] for medications.  Clinic follow up with surgery along with remind for follow up for PCP at [facility] - pt to schedule appointment.  Likely discharge to home with family today.  Sister at bedside to provide transportation by private vehicle.   Expected Discharge Plan: Home/Self Care Barriers to Discharge: No Barriers Identified  Expected Discharge Plan and Services Expected Discharge Plan: Home/Self Care   Discharge Planning Services: Medication Assistance, Follow-up appt scheduled                                           Social Determinants of Health (SDOH) Interventions    Readmission Risk Interventions     No data to display

## 2022-05-25 NOTE — Progress Notes (Signed)
1 Day Post-Op  Subjective: Had some dizziness with ambulation earlier this am.  Tolerating CLD with no nausea or vomiting.  Hasn't taken any oral pain meds yet.  C/o pain with movement and with inspiration  ROS: See above, otherwise other systems negative  Objective: Vital signs in last 24 hours: Temp:  [97.2 F (36.2 C)-99.1 F (37.3 C)] 98.3 F (36.8 C) (08/30 0740) Pulse Rate:  [55-100] 55 (08/30 0740) Resp:  [12-19] 19 (08/30 0740) BP: (104-134)/(67-79) 104/68 (08/30 0740) SpO2:  [94 %-100 %] 96 % (08/30 0740) Last BM Date : 05/23/22  Intake/Output from previous day: 08/29 0701 - 08/30 0700 In: 1982.1 [P.O.:200; I.V.:1582.1; IV Piggyback:200] Out: 630 [Urine:600; Blood:30] Intake/Output this shift: No intake/output data recorded.  PE: Heart: regular Lungs: CTAB Abd: soft, appropriately tender, +BS, ND, incisions c/d/i  Lab Results:  Recent Labs    05/23/22 1505 05/24/22 0606  WBC 7.1 5.6  HGB 13.2 12.2  HCT 40.6 36.4  PLT 168 145*   BMET Recent Labs    05/23/22 1505 05/24/22 0606  NA 139 139  K 3.9 3.9  CL 105 109  CO2 24 25  GLUCOSE 105* 108*  BUN 8 6  CREATININE 0.55 0.67  CALCIUM 10.1 8.5*   PT/INR No results for input(s): "LABPROT", "INR" in the last 72 hours. CMP     Component Value Date/Time   NA 139 05/24/2022 0606   NA 135 03/03/2021 0854   K 3.9 05/24/2022 0606   CL 109 05/24/2022 0606   CO2 25 05/24/2022 0606   GLUCOSE 108 (H) 05/24/2022 0606   BUN 6 05/24/2022 0606   BUN 3 (L) 03/03/2021 0854   CREATININE 0.67 05/24/2022 0606   CREATININE 0.49 (L) 02/29/2016 0001   CALCIUM 8.5 (L) 05/24/2022 0606   PROT 6.1 (L) 05/24/2022 0606   PROT 6.6 03/03/2021 0854   ALBUMIN 3.4 (L) 05/24/2022 0606   ALBUMIN 4.2 03/03/2021 0854   AST 254 (H) 05/24/2022 0606   ALT 372 (H) 05/24/2022 0606   ALKPHOS 103 05/24/2022 0606   BILITOT 1.3 (H) 05/24/2022 0606   BILITOT 0.4 03/03/2021 0854   GFRNONAA >60 05/24/2022 0606   GFRAA >60  01/03/2018 1830   Lipase     Component Value Date/Time   LIPASE 20 05/23/2022 1505       Studies/Results: US Abdomen Limited RUQ (LIVER/GB)  Result Date: 05/23/2022 CLINICAL DATA:  Right upper quadrant pain EXAM: ULTRASOUND ABDOMEN LIMITED RIGHT UPPER QUADRANT COMPARISON:  Abdominal ultrasound 05/07/2021 FINDINGS: Gallbladder: Gallstones are present measuring up to 7 mm. There is gallbladder wall thickening measuring 3.8 mm. The gallbladder is dilated. There is a small amount of pericholecystic fluid. Sonographic Eulah Pont sign is positive per sonographer. Common bile duct: Diameter: 3 mm Liver: No focal lesion identified. Increase in parenchymal echogenicity. Portal vein is patent on color Doppler imaging with normal direction of blood flow towards the liver. Other: None. IMPRESSION: 1. Cholelithiasis, dilated gallbladder, wall thickening and pericholecystic fluid with positive sonographic Murphy sign highly concerning for acute cholecystitis. 2. Echogenic liver likely related to diffuse fatty infiltration. Electronically Signed   By: Darliss Cheney M.D.   On: 05/23/2022 16:58    Anti-infectives: Anti-infectives (From admission, onward)    Start     Dose/Rate Route Frequency Ordered Stop   05/24/22 1000  cefTRIAXone (ROCEPHIN) 2 g in sodium chloride 0.9 % 100 mL IVPB        2 g 200 mL/hr over 30 Minutes Intravenous On call  to O.R. 05/24/22 0908 05/24/22 1307   05/23/22 1745  cefTRIAXone (ROCEPHIN) 2 g in sodium chloride 0.9 % 100 mL IVPB        2 g 200 mL/hr over 30 Minutes Intravenous  Once 05/23/22 1738 05/23/22 2043        Assessment/Plan POD 1, s/p lap chole by Dr. Bedelia Person, 8/29 -awaiting labs this am.  If LFTs downtrending and patient otherwise improved with oral meds, can go home later today -if hgb stable, can add toradol -DC purewick and mobilize patient  -regular diet -multi-modal pain control   FEN - regular diet, SLIV VTE - lovenox ID - no further abx needed    LOS:  0 days    Letha Cape , [facility] 05/25/2022, 9:40 AM Please see Amion for pager number during day hours 7:00am-4:30pm or 7:00am -11:30am on weekends

## 2022-05-25 NOTE — Progress Notes (Signed)
Patient assisted to ambulate to bathroom complained of dizziness assited back to bed vital signs done see flow chart noted to be stable. Observation continues. Relatives at bedside.

## 2022-05-25 NOTE — Progress Notes (Signed)
Nurse encouraged pt to ambulate throughout shift today. With ambulation pt continues to report dizziness. On call MD contacted for concerns prior to discharge. MD stated to keep patient overnight and monitor. Pt and pt's spouse at bedside updated and agreeable to POC. No new concerns at this time

## 2022-05-25 NOTE — Discharge Summary (Signed)
Patient ID: [name] Li [id number] 09-18-1981 41 y.o.  Admit date: 05/23/2022 Discharge date: 05/26/2022  Admitting Diagnosis: Acute cholecystitis  Discharge Diagnosis Patient Active Problem List   Diagnosis Date Noted   Acute cholecystitis 05/23/2022   Pre-diabetes 10/21/2021   Preeclampsia, severe, third trimester 08/12/2021   Anxiety and depression 06/21/2021   BMI 40.0-44.9, adult (HCC) 04/26/2021   Language barrier 04/26/2021   High risk medication use 01/22/2020   Multiple sclerosis (HCC) 07/04/2019   Numbness 07/04/2019   Ataxic gait 07/04/2019  S/p lap chole  Consultants none  Reason for Admission: This is a 41 yo Hispanic female with a history of MS who has had 4 previous episodes of abdominal pain in her RUQ that have resolved on their own.  Yesterday am at 0300, she was awoke from sleep with RUQ abdominal pain.  She denies any N/V/D/fevers, CP, SOB, dysuria, etc.  Her pain persisted throughout the day and she came to the ED.  Her pain has improved this morning, but she was found to have cholecystitis on abdominal US.  She has a normal WBC but elevated LFTs, which are stable to slightly downtrending today.  She will be admitted for lap chole.  Procedures Lap chole, unable to do IOC, Dr. Bedelia Person 8/29  Hospital Course:  The patient was admitted and underwent a laparoscopic cholecystectomy.  The patient tolerated the procedure well.  Given she was unable to obtain an IOC and LFTs were elevated, they were rechecked on POD 1.  They were downtrending and a TB that had normalized.  She had some issues with dizziness and pain on POD 1 and remained until POD 2.  On POD 2, the patient was tolerating a regular diet, voiding well, mobilizing, and pain was controlled with oral pain medications and dizziness resolved.  The patient was stable for DC home at this time with appropriate follow up made.  PE: Abd: soft, appropriately tender, incisions c/d/I, ND  Allergies  as of 05/25/2022   No Known Allergies      Medication List     STOP taking these medications    naproxen sodium 220 MG tablet Commonly known as: ALEVE       TAKE these medications    Acetaminophen Extra Strength 500 MG Tabs Commonly known as: TYLENOL Take 2 tablets (1,000 mg total) by mouth 4 (four) times daily. What changed:  when to take this reasons to take this   docusate sodium 100 MG capsule Commonly known as: Colace Take 1 capsule (100 mg total) by mouth 2 (two) times daily.   ibuprofen 600 MG tablet Commonly known as: ADVIL Take 1 tablet (600 mg total) by mouth 4 (four) times daily. What changed:  when to take this reasons to take this   methocarbamol 750 MG tablet Commonly known as: Robaxin-750 Take 1 tablet (750 mg total) by mouth 4 (four) times daily.   oxyCODONE 5 MG immediate release tablet Commonly known as: Roxicodone Take 1 tablet (5 mg total) by mouth every 4 (four) hours as needed for severe pain.   prenatal multivitamin Tabs tablet Take 1 tablet by mouth daily at 12 noon.   TUMS PO Take 1 tablet by mouth every 4 (four) hours as needed (heartburn).   Tysabri 300 MG/15ML injection Generic drug: natalizumab Inject 300 mg into the vein every 28 (twenty-eight) days.          Follow-up Information     Maczis, Hedda Slade, PA-C Follow up on 06/14/2022.  Specialty: General Surgery Why: 11 am, Arrive 30 minutes prior to your appointment time, Please bring your insurance card and photo ID Contact information: [address] [facility] [place] [address] [phone]         [facility] Follow up.   Why: Please call the clinic and schedule an appointment for primary care provider. Contact information: [address] [employer] [address] [place] [address] [phone]                Signed: Barnetta Chapel, [facility] [address] [facility] 05/25/2022,  11:23 AM Please see Amion for pager number during day hours 7:00am-4:30pm, 7-11:30am on Weekends

## 2022-05-26 ENCOUNTER — Other Ambulatory Visit (HOSPITAL_COMMUNITY): Payer: Self-pay

## 2022-05-26 NOTE — Plan of Care (Signed)

## 2022-05-26 NOTE — Progress Notes (Signed)
Pt was given discharge paperwork and [facility] pharmacy home medications. Discharge instructions explained to patient and daughter at bedside using virtual interpreter. Pt discharged at 1140AM via wheelchair to discharge lounge. Unable to discharge pt out of system due to IT issue. AC and patient placement notified.

## 2022-06-29 ENCOUNTER — Other Ambulatory Visit: Payer: Self-pay | Admitting: *Deleted

## 2022-06-29 ENCOUNTER — Telehealth: Payer: Self-pay | Admitting: *Deleted

## 2022-06-29 DIAGNOSIS — G35 Multiple sclerosis: Secondary | ICD-10-CM

## 2022-06-29 DIAGNOSIS — Z79899 Other long term (current) drug therapy: Secondary | ICD-10-CM

## 2022-06-29 NOTE — Telephone Encounter (Signed)
Placed JCV lab in quest lock box for routine lab pick up. Results pending. 

## 2022-07-11 NOTE — Telephone Encounter (Signed)
JCV ab drawn on 06/29/22 indeterminate, index: 0.22. Inhibition assay: negative.

## 2022-07-26 ENCOUNTER — Ambulatory Visit (INDEPENDENT_AMBULATORY_CARE_PROVIDER_SITE_OTHER): Payer: Self-pay | Admitting: Neurology

## 2022-07-26 ENCOUNTER — Telehealth: Payer: Self-pay | Admitting: Neurology

## 2022-07-26 ENCOUNTER — Encounter: Payer: Self-pay | Admitting: Neurology

## 2022-07-26 VITALS — BP 112/73 | HR 74 | Ht 66.0 in | Wt 234.0 lb

## 2022-07-26 DIAGNOSIS — R42 Dizziness and giddiness: Secondary | ICD-10-CM

## 2022-07-26 DIAGNOSIS — R200 Anesthesia of skin: Secondary | ICD-10-CM

## 2022-07-26 DIAGNOSIS — G35 Multiple sclerosis: Secondary | ICD-10-CM

## 2022-07-26 DIAGNOSIS — R260 Ataxic gait: Secondary | ICD-10-CM

## 2022-07-26 NOTE — Progress Notes (Signed)
[facility]  PATIENT: [name] Li DOB: 1981/05/10  REFERRING DOCTOR OR PCP:  Allyne Gee SOURCE: Patient, sister, interpreter, notes from Dr. Mordecai Maes, imaging reports, multiple MRI images personally reviewed.  _________________________________   HISTORICAL  CHIEF COMPLAINT:  Chief Complaint  Patient presents with   Follow-up    Pt in room #1 and alone. Pt here today for f/u on Tysabil for her MS.    HISTORY OF PRESENT ILLNESS:  [name] Li is a 41 yo woman with relapsing remitting multiple sclerosis.  Update 07/26/2022: She delivered Novemebr 2022 and went back on Tysabri shortly afterwards.    She was on Tysabri and tolerated it well.   She remains JCV Ab negative (06/29/2022 was 0/22 negative).   The pregnancy was unplanned and she stopped the Tysabri around week 10.  She continues off on Tysabri and notes no exacerbations or new neurologic symptoms.  She does have some back pain and occasionally will have a little bit of numbness in the hands.  Gait and balance are doing well.  She gets a little bit of numbness in the hands at times but no significant numbness.  Strength is fine.  Vision is fine.  She denies urinary dysfunction.  She had some tingling in her legs that is intermittent.   She notes it more since her cholecystectomy.  She had had a lot of neck pain ad upper back pain after the surgery.      MRI cervical in 2020 showed some DDD at C4C% and C6C7.    She has only mild fatigue.     She is active and does all activities that she wants to do.  Some sleep disturbance since later in the pregnancy.    She denies any problems with mood or cognition.     MS history: She was diagnosed with MS March 2020 after presenting with neurologic symptoms and abnormal imaging studies.   In January 2020, she felt numbness in her right scalp and the entire left side with a heavy sensation in her left leg.   She also had mild issues with balance  and gait.  Her right leg felt weak but the numbness and pain was more noted than weakness.  She had MRI studies initially of the lumbar spine which did not show significant degenerative changes but did show some abnormal signal in the thoracic spinal cord concerning for demyelination.  She then had an MRI of the cervical and thoracic spine 11/30/2018 showing multiple lesions including some that enhance, consistent with MS.  She had an MRI of the brain on 12/03/2018 showing multiple lesions consistent with MS including one that enhanced.  She received 5 days of IV Solumedrol in a clinic.  She saw a neurologist in [place] and then had the infusions in [place]. She felt better after the steroids for the next few months. In retrospect she had numbness in her hands and legs at various times in 2019 and noted dysesthesias.     However, there were no imaging studies at that time.  No FH of MS.      Imaging: MRI of the brain 08/12/2020 showed T2/FLAIR hyperintense foci in the periventricular, juxtacortical and deep white matter of the cerebral hemispheres in a pattern and configuration consistent with chronic demyelinating plaque associated with multiple sclerosis.  None of the foci enhances or appears to be acute.  However, 2 of the chronic foci on the current MRI were not apparent on the 2020 MRI and likely  occurred during the interim.  The abnormal enhancement associated with several of the lesions in 2020 has resolved and some of these foci are reduced in size compared to the previous MRI.  Normal enhancement pattern.  No acute findings. ' MRI brain 12/03/2018:   Multiple T2/flair hyperintense foci predominantly in the periventricular white matter with some in the in the juxtacortical white matter.  A large focus in the right medial temporal lobe enhances after contrast.  MRI cervical spine 12/01/2018: Extensive T2 hyperintense signal within the spinal cord.  Posteriorly to the left at C2, medially at C2-C3,  posteriorly to the right at C3 and towards the right at C3 (this focus enhances), medially and posteriorly at C4-C5, left greater than right, anteriorly at C5-C6 (mild enhancement), medially at C6-C7.  MRI thoracic spine 12/01/2018: This study is degraded by motion.  Extensive foci throughout this thoracic spine with probable enhancement at T2-T3, T6-T7 and T8-T9 (difficult to be certain due to motion and seen enhancement only on axial not sagittal views)  MRI lumbar spine 10/27/2018: Degenerative changes towards the right at L4-L5 but no nerve root compression.  Abnormal signal within the visible thoracic spinal cord concerning for demyelination.   REVIEW OF SYSTEMS: Constitutional: No fevers, chills, sweats, or change in appetite Eyes: No visual changes, double vision, eye pain Ear, nose and throat: No hearing loss, ear pain, nasal congestion, sore throat Cardiovascular: No chest pain, palpitations Respiratory:  No shortness of breath at rest or with exertion.   No wheezes GastrointestinaI: No nausea, vomiting, diarrhea, abdominal pain, fecal incontinence Genitourinary:  No dysuria, urinary retention or frequency.  No nocturia. Musculoskeletal:  No neck pain, back pain Integumentary: No rash, pruritus, skin lesions Neurological: as above Psychiatric: No depression at this time.  No anxiety Endocrine: No palpitations, diaphoresis, change in appetite, change in weigh or increased thirst Hematologic/Lymphatic:  No anemia, purpura, petechiae. Allergic/Immunologic: No itchy/runny eyes, nasal congestion, recent allergic reactions, rashes  ALLERGIES: No Known Allergies  HOME MEDICATIONS:  Current Outpatient Medications:    acetaminophen (TYLENOL) 500 MG tablet, Take 2 tablets (1,000 mg total) by mouth 4 (four) times daily., Disp: 120 tablet, Rfl: 3   Calcium Carbonate Antacid (TUMS PO), Take 1 tablet by mouth every 4 (four) hours as needed (heartburn)., Disp: , Rfl:    docusate sodium (COLACE)  100 MG capsule, Take 1 capsule (100 mg total) by mouth 2 (two) times daily., Disp: 60 capsule, Rfl: 2   ibuprofen (ADVIL) 600 MG tablet, Take 1 tablet (600 mg total) by mouth 4 (four) times daily., Disp: 120 tablet, Rfl: 1   methocarbamol (ROBAXIN-750) 750 MG tablet, Take 1 tablet (750 mg total) by mouth 4 (four) times daily., Disp: 120 tablet, Rfl: 1   natalizumab (TYSABRI) 300 MG/15ML injection, Inject 300 mg into the vein every 28 (twenty-eight) days., Disp: , Rfl:    oxyCODONE (ROXICODONE) 5 MG immediate release tablet, Take 1 tablet (5 mg total) by mouth every 4 (four) hours as needed for severe pain., Disp: 30 tablet, Rfl: 0   Prenatal Vit-Fe Fumarate-FA (PRENATAL MULTIVITAMIN) TABS tablet, Take 1 tablet by mouth daily at 12 noon., Disp: , Rfl:   PAST MEDICAL HISTORY: Past Medical History:  Diagnosis Date   Depression    Gestational diabetes    glyburide   Multiple sclerosis (HCC)    Nausea and vomiting during pregnancy 04/12/2021    PAST SURGICAL HISTORY: Past Surgical History:  Procedure Laterality Date   CHOLECYSTECTOMY N/A 05/24/2022   Procedure: LAPAROSCOPIC  CHOLECYSTECTOMY;  Surgeon: Diamantina Monks, MD;  Location: [facility] OR;  Service: General;  Laterality: N/A;   NO PAST SURGERIES      FAMILY HISTORY: Family History  Problem Relation Age of Onset   Mental illness Mother        depression and OCD - sees psychiatrist   Multiple births Mother        pregnant with twins but lost them   Diabetes Father    Heart disease Father    Diabetes Brother    Thyroid disease Brother     SOCIAL HISTORY:  Social History   Socioeconomic History   Marital status: Married    Spouse name: Not on file   Number of children: Not on file   Years of education: Not on file   Highest education level: Not on file  Occupational History   Not on file  Tobacco Use   Smoking status: Never   Smokeless tobacco: Never  Vaping Use   Vaping Use: Never used  Substance and Sexual Activity    Alcohol use: No    Comment: socially   Drug use: No   Sexual activity: Yes    Birth control/protection: None  Other Topics Concern   Not on file  Social History Narrative   Not on file   Social Determinants of Health   Financial Resource Strain: Not on file  Food Insecurity: No Food Insecurity (03/08/2021)   Hunger Vital Sign    Worried About Running Out of Food in the Last Year: Never true    Ran Out of Food in the Last Year: Never true  Transportation Needs: No Transportation Needs (03/08/2021)   PRAPARE - Administrator, Civil Service (Medical): No    Lack of Transportation (Non-Medical): No  Physical Activity: Not on file  Stress: Not on file  Social Connections: Not on file  Intimate Partner Violence: Not on file     PHYSICAL EXAM  Vitals:   07/26/22 1052  BP: 112/73  Pulse: 74  Weight: 234 lb (106.1 kg)  Height: 5\' 6"  (1.676 m)    Body mass index is 37.77 kg/m.   General: The patient is well-developed and well-nourished and in no acute distress.  She is [redacted] weeks pregnant today.  HEENT:  Head is [place]/AT.  Ear canals and TM's intact.      Skin: Extremities are without rash or  edema.  Neurologic Exam  Mental status: The patient is alert and oriented x 3 at the time of the examination. The patient has apparent normal recent and remote memory, with an apparently normal attention span and concentration ability.   Speech is normal.  Cranial nerves: Extraocular movements are full.  There is good facial sensation to soft touch bilaterally.Facial strength is normal.  Trapezius and sternocleidomastoid strength is normal. No dysarthria is noted.     Hearing mildly reduced on left.  The Weber did not lateralize.  Motor:  Muscle bulk is normal.   Tone is normal. Strength is  5 / 5 in all 4 extremities.   Sensory: She reports reduced sensation to touch in the hands.  Normal sensation reported elsewhere.  Coordination: Cerebellar testing reveals good  finger-nose-finger and heel-to-shin bilaterally.  Gait and station: Station is normal.  Gait was normal.  Tandem gait is mildly wide.  Romberg is negative.    Reflexes: Deep tendon reflexes are symmetric and normal bilaterally.     DIAGNOSTIC DATA (LABS, IMAGING, TESTING) - I reviewed  patient records, labs, notes, testing and imaging myself where available.  Lab Results  Component Value Date   WBC 11.5 (H) 05/25/2022   HGB 12.2 05/25/2022   HCT 37.0 05/25/2022   MCV 83.1 05/25/2022   PLT 175 05/25/2022      Component Value Date/Time   NA 138 05/25/2022 0934   NA 135 03/03/2021 0854   K 4.0 05/25/2022 0934   CL 107 05/25/2022 0934   CO2 26 05/25/2022 0934   GLUCOSE 129 (H) 05/25/2022 0934   BUN <5 (L) 05/25/2022 0934   BUN 3 (L) 03/03/2021 0854   CREATININE 0.72 05/25/2022 0934   CREATININE 0.49 (L) 02/29/2016 0001   CALCIUM 8.6 (L) 05/25/2022 0934   PROT 6.1 (L) 05/25/2022 0934   PROT 6.6 03/03/2021 0854   ALBUMIN 3.3 (L) 05/25/2022 0934   ALBUMIN 4.2 03/03/2021 0854   AST 78 (H) 05/25/2022 0934   ALT 234 (H) 05/25/2022 0934   ALKPHOS 88 05/25/2022 0934   BILITOT 0.9 05/25/2022 0934   BILITOT 0.4 03/03/2021 0854   GFRNONAA >60 05/25/2022 0934   GFRAA >60 01/03/2018 1830   No results found for: "CHOL", "HDL", "LDLCALC", "LDLDIRECT", "TRIG", "CHOLHDL" Lab Results  Component Value Date   HGBA1C 6.1 (H) 02/24/2021   No results found for: "VITAMINB12" Lab Results  Component Value Date   TSH 1.960 03/03/2021       ASSESSMENT AND PLAN  Multiple sclerosis (HCC) - Plan: MR BRAIN WO CONTRAST, MR CERVICAL SPINE WO CONTRAST  Numbness  Dizziness  Ataxic gait   1.   She will continue Tysabri.  Few weeks ago she was JCV antibody negative and will be retested in about 6 months.  We will check MRI of the brain and cervical spine without contrast to be baseline after she was off therapy for the pregnancy and to determine if significant breakthrough activity.. 2.     stay active and exercise as tolerated. 3.   She will return to see [place]  in 6 months or sooner if there are new or worsening neurologic symptoms.     Mayer Vondrak A. Epimenio Foot, MD, [facility] 07/26/2022, 11:19 AM Certified in Neurology, Clinical Neurophysiology, Sleep Medicine and Neuroimaging  [facility] [address] [place], [place] [address] [phone]

## 2022-07-26 NOTE — Telephone Encounter (Signed)
self-pay sent to GI [phone] 

## 2023-01-16 ENCOUNTER — Other Ambulatory Visit: Payer: Self-pay | Admitting: *Deleted

## 2023-01-16 ENCOUNTER — Other Ambulatory Visit: Payer: Self-pay

## 2023-01-16 ENCOUNTER — Telehealth: Payer: Self-pay

## 2023-01-16 DIAGNOSIS — G35 Multiple sclerosis: Secondary | ICD-10-CM

## 2023-01-16 DIAGNOSIS — Z79899 Other long term (current) drug therapy: Secondary | ICD-10-CM

## 2023-01-16 NOTE — Telephone Encounter (Signed)
Placed JCV AB Lab in [facility] for Pick-up. Results pending.   

## 2023-01-17 LAB — CBC WITH DIFFERENTIAL/PLATELET
Basophils Absolute: 0 10*3/uL (ref 0.0–0.2)
Basos: 0 %
EOS (ABSOLUTE): 0.1 10*3/uL (ref 0.0–0.4)
Eos: 1 %
Hematocrit: 37.2 % (ref 34.0–46.6)
Hemoglobin: 12.7 g/dL (ref 11.1–15.9)
Immature Grans (Abs): 0 10*3/uL (ref 0.0–0.1)
Immature Granulocytes: 1 %
Lymphocytes Absolute: 1.7 10*3/uL (ref 0.7–3.1)
Lymphs: 30 %
MCH: 28.3 pg (ref 26.6–33.0)
MCHC: 34.1 g/dL (ref 31.5–35.7)
MCV: 83 fL (ref 79–97)
Monocytes Absolute: 0.3 10*3/uL (ref 0.1–0.9)
Monocytes: 5 %
Neutrophils Absolute: 3.7 10*3/uL (ref 1.4–7.0)
Neutrophils: 63 %
Platelets: 173 10*3/uL (ref 150–450)
RBC: 4.49 x10E6/uL (ref 3.77–5.28)
RDW: 13.4 % (ref 11.7–15.4)
WBC: 5.9 10*3/uL (ref 3.4–10.8)

## 2023-02-01 NOTE — Progress Notes (Deleted)
No chief complaint on file.   HISTORY OF PRESENT ILLNESS:  02/01/23 ALL:  [name] Li is a 42 y.o. female here today for follow up for RRMS. She continues Tysabri.. She is tolerating infusions. Labs have been stable. Last JCV 0.22, negative in 06/2022. MRI brian 07/2020 was stable.   She feels that she is doing fairly well. No obvious exacerbating symptoms. She has intermittent numbness of bottom of left foot, no pain. Walking barefoot will trigger numbness. No trouble walking. No assistive device. No new weakness.   She has had some back pain following epidural injection during delivery.   She has had intermittent dizziness for about 2 months. She feels it is steadily improving. Last event was about a month ago. She feels that it was related to not sleeping. She is being treated for IDA with the [facility]. Last labs stable per patient report in 11/2021. She drinks about 6 glasses of water now but admits that she was not drinking much water.   She does not sleep for long periods. She is up and down at night with her newborn. She is not breastfeeding. She feels mood is good. She is able to stay at home and care for her children.   No bowel, bladder or vision changes.   HISTORY (copied from Sater's previous note)  [name] is a 42 yo woman with relapsing remitting multiple sclerosis.   Update 07/26/2022: She delivered Novemebr 2022 and went back on Tysabri shortly afterwards.     She was on Tysabri and tolerated it well.   She remains JCV Ab negative (06/29/2022 was 0/22 negative).   The pregnancy was unplanned and she stopped the Tysabri around week 10.  She continues off on Tysabri and notes no exacerbations or new neurologic symptoms.  She does have some back pain and occasionally will have a little bit of numbness in the hands.   Gait and balance are doing well.  She gets a little bit of numbness in the hands at times but no significant numbness.   Strength is fine.  Vision is fine.  She denies urinary dysfunction.   She had some tingling in her legs that is intermittent.   She notes it more since her cholecystectomy.  She had had a lot of neck pain ad upper back pain after the surgery.      MRI cervical in 2020 showed some DDD at C4C% and C6C7.     She has only mild fatigue.     She is active and does all activities that she wants to do.  Some sleep disturbance since later in the pregnancy.    She denies any problems with mood or cognition.      MS history: She was diagnosed with MS March 2020 after presenting with neurologic symptoms and abnormal imaging studies.   In January 2020, she felt numbness in her right scalp and the entire left side with a heavy sensation in her left leg.   She also had mild issues with balance and gait.  Her right leg felt weak but the numbness and pain was more noted than weakness.  She had MRI studies initially of the lumbar spine which did not show significant degenerative changes but did show some abnormal signal in the thoracic spinal cord concerning for demyelination.  She then had an MRI of the cervical and thoracic spine 11/30/2018 showing multiple lesions including some that enhance, consistent with MS.  She had an MRI of  the brain on 12/03/2018 showing multiple lesions consistent with MS including one that enhanced.  She received 5 days of IV Solumedrol in a clinic.  She saw a neurologist in [place] and then had the infusions in [place]. She felt better after the steroids for the next few months. In retrospect she had numbness in her hands and legs at various times in 2019 and noted dysesthesias.     However, there were no imaging studies at that time.   No FH of MS.       Imaging: MRI of the brain 08/12/2020 showed T2/FLAIR hyperintense foci in the periventricular, juxtacortical and deep white matter of the cerebral hemispheres in a pattern and configuration consistent with chronic demyelinating plaque  associated with multiple sclerosis.  None of the foci enhances or appears to be acute.  However, 2 of the chronic foci on the current MRI were not apparent on the 2020 MRI and likely occurred during the interim.  The abnormal enhancement associated with several of the lesions in 2020 has resolved and some of these foci are reduced in size compared to the previous MRI.  Normal enhancement pattern.  No acute findings. ' MRI brain 12/03/2018:   Multiple T2/flair hyperintense foci predominantly in the periventricular white matter with some in the in the juxtacortical white matter.  A large focus in the right medial temporal lobe enhances after contrast.   MRI cervical spine 12/01/2018: Extensive T2 hyperintense signal within the spinal cord.  Posteriorly to the left at C2, medially at C2-C3, posteriorly to the right at C3 and towards the right at C3 (this focus enhances), medially and posteriorly at C4-C5, left greater than right, anteriorly at C5-C6 (mild enhancement), medially at C6-C7.   MRI thoracic spine 12/01/2018: This study is degraded by motion.  Extensive foci throughout this thoracic spine with probable enhancement at T2-T3, T6-T7 and T8-T9 (difficult to be certain due to motion and seen enhancement only on axial not sagittal views)   MRI lumbar spine 10/27/2018: Degenerative changes towards the right at L4-L5 but no nerve root compression.  Abnormal signal within the visible thoracic spinal cord concerning for demyelination.   REVIEW OF SYSTEMS: Out of a complete 14 system review of symptoms, the patient complains only of the following symptoms, intermittent numbness of left foot, intermittent dizziness and all other reviewed systems are negative.   ALLERGIES: No Known Allergies   HOME MEDICATIONS: Outpatient Medications Prior to Visit  Medication Sig Dispense Refill   acetaminophen (TYLENOL) 500 MG tablet Take 2 tablets (1,000 mg total) by mouth 4 (four) times daily. 120 tablet 3   Calcium  Carbonate Antacid (TUMS PO) Take 1 tablet by mouth every 4 (four) hours as needed (heartburn).     docusate sodium (COLACE) 100 MG capsule Take 1 capsule (100 mg total) by mouth 2 (two) times daily. 60 capsule 2   ibuprofen (ADVIL) 600 MG tablet Take 1 tablet (600 mg total) by mouth 4 (four) times daily. 120 tablet 1   methocarbamol (ROBAXIN-750) 750 MG tablet Take 1 tablet (750 mg total) by mouth 4 (four) times daily. 120 tablet 1   natalizumab (TYSABRI) 300 MG/15ML injection Inject 300 mg into the vein every 28 (twenty-eight) days.     oxyCODONE (ROXICODONE) 5 MG immediate release tablet Take 1 tablet (5 mg total) by mouth every 4 (four) hours as needed for severe pain. 30 tablet 0   Prenatal Vit-Fe Fumarate-FA (PRENATAL MULTIVITAMIN) TABS tablet Take 1 tablet by mouth daily at 12  noon.     No facility-administered medications prior to visit.     PAST MEDICAL HISTORY: Past Medical History:  Diagnosis Date   Depression    Gestational diabetes    glyburide   Multiple sclerosis (HCC)    Nausea and vomiting during pregnancy 04/12/2021     PAST SURGICAL HISTORY: Past Surgical History:  Procedure Laterality Date   CHOLECYSTECTOMY N/A 05/24/2022   Procedure: LAPAROSCOPIC CHOLECYSTECTOMY;  Surgeon: Diamantina Monks, MD;  Location: [facility] OR;  Service: General;  Laterality: N/A;   NO PAST SURGERIES       FAMILY HISTORY: Family History  Problem Relation Age of Onset   Mental illness Mother        depression and OCD - sees psychiatrist   Multiple births Mother        pregnant with twins but lost them   Diabetes Father    Heart disease Father    Diabetes Brother    Thyroid disease Brother      SOCIAL HISTORY: Social History   Socioeconomic History   Marital status: Married    Spouse name: Not on file   Number of children: Not on file   Years of education: Not on file   Highest education level: Not on file  Occupational History   Not on file  Tobacco Use   Smoking status:  Never   Smokeless tobacco: Never  Vaping Use   Vaping Use: Never used  Substance and Sexual Activity   Alcohol use: No    Comment: socially   Drug use: No   Sexual activity: Yes    Birth control/protection: None  Other Topics Concern   Not on file  Social History Narrative   Not on file   Social Determinants of Health   Financial Resource Strain: Not on file  Food Insecurity: No Food Insecurity (03/08/2021)   Hunger Vital Sign    Worried About Running Out of Food in the Last Year: Never true    Ran Out of Food in the Last Year: Never true  Transportation Needs: No Transportation Needs (03/08/2021)   PRAPARE - Administrator, Civil Service (Medical): No    Lack of Transportation (Non-Medical): No  Physical Activity: Not on file  Stress: Not on file  Social Connections: Not on file  Intimate Partner Violence: Not on file     PHYSICAL EXAM  There were no vitals filed for this visit.  There is no height or weight on file to calculate BMI.  Generalized: Well developed, in no acute distress  Cardiology: normal rate and rhythm, no murmur auscultated  Respiratory: clear to auscultation bilaterally    Neurological examination  Mentation: Alert oriented to time, place, history taking. Follows all commands speech and language fluent Cranial nerve II-XII: Pupils were equal round reactive to light. Extraocular movements were full, visual field were full on confrontational test. Facial sensation and strength were normal. Uvula tongue midline. Head turning and shoulder shrug  were normal and symmetric. Motor: The motor testing reveals 5 over 5 strength of all 4 extremities. Good symmetric motor tone is noted throughout.  Sensory: Sensory testing is intact to soft touch on all 4 extremities. No evidence of extinction is noted.  Coordination: Cerebellar testing reveals good finger-nose-finger and heel-to-shin bilaterally.  Gait and station: Gait is normal. Tandem gait is  normal. Romberg is negative. No drift is seen.  Reflexes: Deep tendon reflexes are symmetric and normal bilaterally.    DIAGNOSTIC DATA (LABS, IMAGING,  TESTING) - I reviewed patient records, labs, notes, testing and imaging myself where available.  Lab Results  Component Value Date   WBC 5.9 01/16/2023   HGB 12.7 01/16/2023   HCT 37.2 01/16/2023   MCV 83 01/16/2023   PLT 173 01/16/2023      Component Value Date/Time   NA 138 05/25/2022 0934   NA 135 03/03/2021 0854   K 4.0 05/25/2022 0934   CL 107 05/25/2022 0934   CO2 26 05/25/2022 0934   GLUCOSE 129 (H) 05/25/2022 0934   BUN <5 (L) 05/25/2022 0934   BUN 3 (L) 03/03/2021 0854   CREATININE 0.72 05/25/2022 0934   CREATININE 0.49 (L) 02/29/2016 0001   CALCIUM 8.6 (L) 05/25/2022 0934   PROT 6.1 (L) 05/25/2022 0934   PROT 6.6 03/03/2021 0854   ALBUMIN 3.3 (L) 05/25/2022 0934   ALBUMIN 4.2 03/03/2021 0854   AST 78 (H) 05/25/2022 0934   ALT 234 (H) 05/25/2022 0934   ALKPHOS 88 05/25/2022 0934   BILITOT 0.9 05/25/2022 0934   BILITOT 0.4 03/03/2021 0854   GFRNONAA >60 05/25/2022 0934   GFRAA >60 01/03/2018 1830   No results found for: "CHOL", "HDL", "LDLCALC", "LDLDIRECT", "TRIG", "CHOLHDL" Lab Results  Component Value Date   HGBA1C 6.1 (H) 02/24/2021   No results found for: "VITAMINB12" Lab Results  Component Value Date   TSH 1.960 03/03/2021        No data to display               No data to display           ASSESSMENT AND PLAN  42 y.o. year old female  has a past medical history of Depression, Gestational diabetes, Multiple sclerosis (HCC), and Nausea and vomiting during pregnancy (04/12/2021). here with    No diagnosis found.  Kason Carn is doing well, today. MS symptoms appear stable. I will update labs. Continue Tysabri. She is not breastfeeding. We have discussed need to repeat MRI for monitoring. Imaging stable 07/2020. I have advised we can wait, however, she wishes to repeat for  monitoring. Order placed, today.  She will continue to monitor dizziness. Most likely related to changes in sleep following birth of her 4th child. Healthy lifestyle habits encouraged. She will follow up with PCP as directed. She will return to see Dr Epimenio Foot in 6 months, sooner if needed. She verbalizes understanding and agreement with this plan.    No orders of the defined types were placed in this encounter.    No orders of the defined types were placed in this encounter.    Shawnie Dapper, MSN, FNP-C 02/01/2023, 12:12 PM  [facility] [address] [place], [place] [address] [phone]

## 2023-02-02 ENCOUNTER — Encounter: Payer: Self-pay | Admitting: Family Medicine

## 2023-02-02 ENCOUNTER — Ambulatory Visit: Payer: Self-pay | Admitting: Family Medicine

## 2023-02-02 DIAGNOSIS — Z79899 Other long term (current) drug therapy: Secondary | ICD-10-CM

## 2023-02-02 DIAGNOSIS — G35 Multiple sclerosis: Secondary | ICD-10-CM

## 2023-02-16 NOTE — Patient Instructions (Addendum)
Below is our plan:  We will continue current treatment plan. Consider a wrist brace for numbness in right hand. Let us know if it worsens. We will ask the MRI team to reach out to schedule imaging. Let me know if you do not hear back to schedule. You can reach out to [employer]. They may be able to assist with cost of MRI. Please complete patient assistance paperwork.   Please make sure you are staying well hydrated. I recommend 50-60 ounces daily. Well balanced diet and regular exercise encouraged. Consistent sleep schedule with 6-8 hours recommended.   Please continue follow up with care team as directed.   Follow up with Dr Epimenio Foot in 6 months   You may receive a survey regarding today's visit. I encourage you to leave honest feed back as I do use this information to improve patient care. Thank you for seeing me today!

## 2023-02-16 NOTE — Progress Notes (Signed)
Chief Complaint  Patient presents with   Follow-up    RM 1 with daughter. . Last seen 07/26/22. MS DMT: Tysabri.  Tolerating well.  No vision changes.  Has noticed swelling in feet that started two weeks ago. Fingers fall asleep intermittently. When she gets upset, will get tingling on right side of head and start bleeding out of right nostril (has occurred 3x in last month).  Pt signed waiver for interpreter (daughter will interpret).    HISTORY OF PRESENT ILLNESS:  02/21/23 ALL:  Alda Gautreaux is a 42 y.o. female here today for follow up for RRMS. She continues Tysabri. She is tolerating infusions. Labs have been stable. Last JCV 0.18 12/2022. Lymph 1.7. MRI brian 07/2020 was stable. MRI brain and cervical imaging ordered but she reports she could not schedule due to language barrier.   She feels that she is doing fairly well. No obvious exacerbating symptoms. She has intermittent numbness of bottom of left foot, no pain. Intermittent numbness of both hands. Worse with holding a book or ironing. No trouble walking. She did have one fall but reports tripping over her child who was playing near steps. No assistive device. No new weakness. Back pain is stable.   She does not sleep for long periods. She is up and down at night with her children. She feels mood is good. She does feel tingling on right side of head when she gets anxious. She also report a coule of nose bleeds over the past month. She is followed by PCP at a clinic in [place], [place] but unsure of name.   No bowel, bladder or vision changes. No recent yeast infections.   William Dalton (husband) [phone]  Kandis Cocking (son) [phone]  HISTORY (copied from Sater's previous note)  Nydia Iorio is a 42 yo woman with relapsing remitting multiple sclerosis.   Update 07/26/2022: She delivered Novemebr 2022 and went back on Tysabri shortly afterwards.     She was on Tysabri and tolerated it  well.   She remains JCV Ab negative (06/29/2022 was 0/22 negative).   The pregnancy was unplanned and she stopped the Tysabri around week 10.  She continues off on Tysabri and notes no exacerbations or new neurologic symptoms.  She does have some back pain and occasionally will have a little bit of numbness in the hands.   Gait and balance are doing well.  She gets a little bit of numbness in the hands at times but no significant numbness.  Strength is fine.  Vision is fine.  She denies urinary dysfunction.   She had some tingling in her legs that is intermittent.   She notes it more since her cholecystectomy.  She had had a lot of neck pain ad upper back pain after the surgery.      MRI cervical in 2020 showed some DDD at C4C% and C6C7.     She has only mild fatigue.     She is active and does all activities that she wants to do.  Some sleep disturbance since later in the pregnancy.    She denies any problems with mood or cognition.      MS history: She was diagnosed with MS March 2020 after presenting with neurologic symptoms and abnormal imaging studies.   In January 2020, she felt numbness in her right scalp and the entire left side with a heavy sensation in her left leg.   She also had mild issues with balance and  gait.  Her right leg felt weak but the numbness and pain was more noted than weakness.  She had MRI studies initially of the lumbar spine which did not show significant degenerative changes but did show some abnormal signal in the thoracic spinal cord concerning for demyelination.  She then had an MRI of the cervical and thoracic spine 11/30/2018 showing multiple lesions including some that enhance, consistent with MS.  She had an MRI of the brain on 12/03/2018 showing multiple lesions consistent with MS including one that enhanced.  She received 5 days of IV Solumedrol in a clinic.  She saw a neurologist in [place] and then had the infusions in [place]. She felt better after the steroids for  the next few months. In retrospect she had numbness in her hands and legs at various times in 2019 and noted dysesthesias.     However, there were no imaging studies at that time.   No FH of MS.       Imaging: MRI of the brain 08/12/2020 showed T2/FLAIR hyperintense foci in the periventricular, juxtacortical and deep white matter of the cerebral hemispheres in a pattern and configuration consistent with chronic demyelinating plaque associated with multiple sclerosis.  None of the foci enhances or appears to be acute.  However, 2 of the chronic foci on the current MRI were not apparent on the 2020 MRI and likely occurred during the interim.  The abnormal enhancement associated with several of the lesions in 2020 has resolved and some of these foci are reduced in size compared to the previous MRI.  Normal enhancement pattern.  No acute findings. ' MRI brain 12/03/2018:   Multiple T2/flair hyperintense foci predominantly in the periventricular white matter with some in the in the juxtacortical white matter.  A large focus in the right medial temporal lobe enhances after contrast.   MRI cervical spine 12/01/2018: Extensive T2 hyperintense signal within the spinal cord.  Posteriorly to the left at C2, medially at C2-C3, posteriorly to the right at C3 and towards the right at C3 (this focus enhances), medially and posteriorly at C4-C5, left greater than right, anteriorly at C5-C6 (mild enhancement), medially at C6-C7.   MRI thoracic spine 12/01/2018: This study is degraded by motion.  Extensive foci throughout this thoracic spine with probable enhancement at T2-T3, T6-T7 and T8-T9 (difficult to be certain due to motion and seen enhancement only on axial not sagittal views)   MRI lumbar spine 10/27/2018: Degenerative changes towards the right at L4-L5 but no nerve root compression.  Abnormal signal within the visible thoracic spinal cord concerning for demyelination.   REVIEW OF SYSTEMS: Out of a complete 14 system  review of symptoms, the patient complains only of the following symptoms, intermittent numbness of left foot and bilateral hands, intermittent dizziness, anxiety and all other reviewed systems are negative.   ALLERGIES: No Known Allergies   HOME MEDICATIONS: Outpatient Medications Prior to Visit  Medication Sig Dispense Refill   acetaminophen (TYLENOL) 500 MG tablet Take 2 tablets (1,000 mg total) by mouth 4 (four) times daily. 120 tablet 3   natalizumab (TYSABRI) 300 MG/15ML injection Inject 300 mg into the vein every 28 (twenty-eight) days.     Calcium Carbonate Antacid (TUMS PO) Take 1 tablet by mouth every 4 (four) hours as needed (heartburn). (Patient not taking: Reported on 02/21/2023)     docusate sodium (COLACE) 100 MG capsule Take 1 capsule (100 mg total) by mouth 2 (two) times daily. (Patient not taking: Reported on 02/21/2023)  60 capsule 2   ibuprofen (ADVIL) 600 MG tablet Take 1 tablet (600 mg total) by mouth 4 (four) times daily. (Patient not taking: Reported on 02/21/2023) 120 tablet 1   methocarbamol (ROBAXIN-750) 750 MG tablet Take 1 tablet (750 mg total) by mouth 4 (four) times daily. (Patient not taking: Reported on 02/21/2023) 120 tablet 1   oxyCODONE (ROXICODONE) 5 MG immediate release tablet Take 1 tablet (5 mg total) by mouth every 4 (four) hours as needed for severe pain. (Patient not taking: Reported on 02/21/2023) 30 tablet 0   Prenatal Vit-Fe Fumarate-FA (PRENATAL MULTIVITAMIN) TABS tablet Take 1 tablet by mouth daily at 12 noon. (Patient not taking: Reported on 02/21/2023)     No facility-administered medications prior to visit.     PAST MEDICAL HISTORY: Past Medical History:  Diagnosis Date   Depression    Gestational diabetes    glyburide   Multiple sclerosis (HCC)    Nausea and vomiting during pregnancy 04/12/2021     PAST SURGICAL HISTORY: Past Surgical History:  Procedure Laterality Date   CHOLECYSTECTOMY N/A 05/24/2022   Procedure: LAPAROSCOPIC  CHOLECYSTECTOMY;  Surgeon: Diamantina Monks, MD;  Location: [facility] OR;  Service: General;  Laterality: N/A;   NO PAST SURGERIES       FAMILY HISTORY: Family History  Problem Relation Age of Onset   Mental illness Mother        depression and OCD - sees psychiatrist   Multiple births Mother        pregnant with twins but lost them   Diabetes Father    Heart disease Father    Diabetes Brother    Thyroid disease Brother      SOCIAL HISTORY: Social History   Socioeconomic History   Marital status: Married    Spouse name: Not on file   Number of children: Not on file   Years of education: Not on file   Highest education level: Not on file  Occupational History   Not on file  Tobacco Use   Smoking status: Never   Smokeless tobacco: Never  Vaping Use   Vaping Use: Never used  Substance and Sexual Activity   Alcohol use: No    Comment: socially   Drug use: No   Sexual activity: Yes    Birth control/protection: None  Other Topics Concern   Not on file  Social History Narrative   Not on file   Social Determinants of Health   Financial Resource Strain: Not on file  Food Insecurity: No Food Insecurity (03/08/2021)   Hunger Vital Sign    Worried About Running Out of Food in the Last Year: Never true    Ran Out of Food in the Last Year: Never true  Transportation Needs: No Transportation Needs (03/08/2021)   PRAPARE - Administrator, Civil Service (Medical): No    Lack of Transportation (Non-Medical): No  Physical Activity: Not on file  Stress: Not on file  Social Connections: Not on file  Intimate Partner Violence: Not on file     PHYSICAL EXAM  Vitals:   02/21/23 1251  BP: 127/76  Pulse: 68  Weight: 243 lb 3.2 oz (110.3 kg)  Height: 5\' 6"  (1.676 m)    Body mass index is 39.25 kg/m.  Generalized: Well developed, in no acute distress  Cardiology: normal rate and rhythm, no murmur auscultated  Respiratory: clear to auscultation bilaterally     Neurological examination  Mentation: Alert oriented to time, place, history taking.  Follows all commands speech and language fluent Cranial nerve II-XII: Pupils were equal round reactive to light. Extraocular movements were full, visual field were full on confrontational test. Facial sensation and strength were normal. Uvula tongue midline. Head turning and shoulder shrug  were normal and symmetric. Motor: The motor testing reveals 5 over 5 strength of all 4 extremities. Good symmetric motor tone is noted throughout.  Sensory: Sensory testing is intact to soft touch on all 4 extremities. No evidence of extinction is noted. Positive Phalen bilaterally, positive Tinel in right hand  Coordination: Cerebellar testing reveals good finger-nose-finger and heel-to-shin bilaterally.  Gait and station: Gait is normal.  Reflexes: Deep tendon reflexes are symmetric and normal bilaterally.    DIAGNOSTIC DATA (LABS, IMAGING, TESTING) - I reviewed patient records, labs, notes, testing and imaging myself where available.  Lab Results  Component Value Date   WBC 5.9 01/16/2023   HGB 12.7 01/16/2023   HCT 37.2 01/16/2023   MCV 83 01/16/2023   PLT 173 01/16/2023      Component Value Date/Time   NA 138 05/25/2022 0934   NA 135 03/03/2021 0854   K 4.0 05/25/2022 0934   CL 107 05/25/2022 0934   CO2 26 05/25/2022 0934   GLUCOSE 129 (H) 05/25/2022 0934   BUN <5 (L) 05/25/2022 0934   BUN 3 (L) 03/03/2021 0854   CREATININE 0.72 05/25/2022 0934   CREATININE 0.49 (L) 02/29/2016 0001   CALCIUM 8.6 (L) 05/25/2022 0934   PROT 6.1 (L) 05/25/2022 0934   PROT 6.6 03/03/2021 0854   ALBUMIN 3.3 (L) 05/25/2022 0934   ALBUMIN 4.2 03/03/2021 0854   AST 78 (H) 05/25/2022 0934   ALT 234 (H) 05/25/2022 0934   ALKPHOS 88 05/25/2022 0934   BILITOT 0.9 05/25/2022 0934   BILITOT 0.4 03/03/2021 0854   GFRNONAA >60 05/25/2022 0934   GFRAA >60 01/03/2018 1830   No results found for: "CHOL", "HDL", "LDLCALC",  "LDLDIRECT", "TRIG", "CHOLHDL" Lab Results  Component Value Date   HGBA1C 6.1 (H) 02/24/2021   No results found for: "VITAMINB12" Lab Results  Component Value Date   TSH 1.960 03/03/2021        No data to display               No data to display           ASSESSMENT AND PLAN  42 y.o. year old female  has a past medical history of Depression, Gestational diabetes, Multiple sclerosis (HCC), and Nausea and vomiting during pregnancy (04/12/2021). here with    Relapsing remitting multiple sclerosis (HCC)  High risk medication use  Uninsured  Language barrier  Numbness  Ghaida Erbes is doing well, today. MS symptoms appear stable. Labs stable 12/2022. We will continue Tysabri. Imaging stable 07/2020. I will have our referral team help her get scheduled for imaging ordered by Dr Epimenio Foot 10/023. She was given paperwork to complete for consideration of [facility] as well as [employer] for possible MRI assistance. Wrist brace advised. May consider NCS/EMG if symptoms worsen. Healthy lifestyle habits encouraged. She will follow up with PCP as directed. She will return to see Dr Epimenio Foot in 6 months, sooner if needed. She verbalizes understanding and agreement with this plan.    No orders of the defined types were placed in this encounter.    No orders of the defined types were placed in this encounter.    Shawnie Dapper, MSN, FNP-C 02/21/2023, 3:11 PM  [facility]  [address], [address] [place], [place] [address] [phone]

## 2023-02-21 ENCOUNTER — Encounter: Payer: Self-pay | Admitting: Family Medicine

## 2023-02-21 ENCOUNTER — Ambulatory Visit: Payer: Self-pay | Admitting: Family Medicine

## 2023-02-21 ENCOUNTER — Telehealth: Payer: Self-pay | Admitting: Family Medicine

## 2023-02-21 VITALS — BP 127/76 | HR 68 | Ht 66.0 in | Wt 243.2 lb

## 2023-02-21 DIAGNOSIS — R260 Ataxic gait: Secondary | ICD-10-CM

## 2023-02-21 DIAGNOSIS — Z5989 Other problems related to housing and economic circumstances: Secondary | ICD-10-CM

## 2023-02-21 DIAGNOSIS — R200 Anesthesia of skin: Secondary | ICD-10-CM

## 2023-02-21 DIAGNOSIS — G35 Multiple sclerosis: Secondary | ICD-10-CM

## 2023-02-21 DIAGNOSIS — Z79899 Other long term (current) drug therapy: Secondary | ICD-10-CM

## 2023-02-21 DIAGNOSIS — Z603 Acculturation difficulty: Secondary | ICD-10-CM

## 2023-02-21 DIAGNOSIS — Z758 Other problems related to medical facilities and other health care: Secondary | ICD-10-CM

## 2023-02-21 NOTE — Telephone Encounter (Signed)
Mrs Lorie Apley reports that she was unable to schedule MRI due to language barrier. Could we use an interpreter to get her scheduled? She game numbers for her son and husband that speak english and can help schedule. She is uninsured and will probably need to discuss payment arrangement as well. TY!  William Dalton (husband) [phone]  Kandis Cocking (son) [phone]

## 2023-02-21 NOTE — Telephone Encounter (Signed)
I sent this information to GI and asked them to reach out again to get her scheduled. [phone]

## 2023-03-08 ENCOUNTER — Encounter: Payer: Self-pay | Admitting: Neurology

## 2023-03-27 ENCOUNTER — Ambulatory Visit
Admission: RE | Admit: 2023-03-27 | Discharge: 2023-03-27 | Disposition: A | Discharge status: Home / Self Care | Payer: No Typology Code available for payment source | Source: Ambulatory Visit | Attending: Neurology | Admitting: Neurology

## 2023-03-27 DIAGNOSIS — G35 Multiple sclerosis: Secondary | ICD-10-CM

## 2023-03-28 ENCOUNTER — Other Ambulatory Visit: Payer: Self-pay | Admitting: Neurology

## 2023-03-28 DIAGNOSIS — G379 Demyelinating disease of central nervous system, unspecified: Secondary | ICD-10-CM

## 2023-04-10 ENCOUNTER — Other Ambulatory Visit: Payer: Self-pay

## 2023-04-10 DIAGNOSIS — G379 Demyelinating disease of central nervous system, unspecified: Secondary | ICD-10-CM

## 2023-04-14 LAB — ANCA PROFILE
Anti-MPO Antibodies: <0.2 units (ref 0.0–0.9)
Anti-PR3 Antibodies: <0.2 units (ref 0.0–0.9)
Atypical pANCA: <1:20 {titer}
C-ANCA: <1:20 {titer}
P-ANCA: <1:20 {titer}

## 2023-04-14 LAB — NEUROMYELITIS OPTICA AUTOAB, IGG: NMO IgG Autoantibodies: <1.5 U/mL (ref 0.0–3.0)

## 2023-04-14 LAB — ANTI-MOG, SERUM: MOG Antibody, Cell-based IFA: NEGATIVE

## 2023-05-02 ENCOUNTER — Ambulatory Visit: Payer: Self-pay | Admitting: Family Medicine

## 2023-07-03 ENCOUNTER — Other Ambulatory Visit: Payer: Self-pay

## 2023-07-03 ENCOUNTER — Other Ambulatory Visit: Payer: Self-pay | Admitting: *Deleted

## 2023-07-03 ENCOUNTER — Telehealth: Payer: Self-pay | Admitting: *Deleted

## 2023-07-03 DIAGNOSIS — Z79899 Other long term (current) drug therapy: Secondary | ICD-10-CM

## 2023-07-03 DIAGNOSIS — G35 Multiple sclerosis: Secondary | ICD-10-CM

## 2023-07-03 NOTE — Telephone Encounter (Signed)
Placed JCV lab in quest lock box for routine lab pick up. Results pending. 

## 2023-07-04 LAB — CBC WITH DIFFERENTIAL/PLATELET
Basophils Absolute: 0 10*3/uL (ref 0.0–0.2)
Basos: 0 %
EOS (ABSOLUTE): 0.2 10*3/uL (ref 0.0–0.4)
Eos: 2 %
Hematocrit: 40.2 % (ref 34.0–46.6)
Hemoglobin: 13.1 g/dL (ref 11.1–15.9)
Immature Grans (Abs): 0.1 10*3/uL (ref 0.0–0.1)
Immature Granulocytes: 1 %
Lymphocytes Absolute: 3.4 10*3/uL — ABNORMAL HIGH (ref 0.7–3.1)
Lymphs: 42 %
MCH: 27.8 pg (ref 26.6–33.0)
MCHC: 32.6 g/dL (ref 31.5–35.7)
MCV: 85 fL (ref 79–97)
Monocytes Absolute: 0.5 10*3/uL (ref 0.1–0.9)
Monocytes: 6 %
Neutrophils Absolute: 4 10*3/uL (ref 1.4–7.0)
Neutrophils: 49 %
Platelets: 190 10*3/uL (ref 150–450)
RBC: 4.72 x10E6/uL (ref 3.77–5.28)
RDW: 13.5 % (ref 11.7–15.4)
WBC: 8.2 10*3/uL (ref 3.4–10.8)

## 2023-09-07 ENCOUNTER — Telehealth: Payer: Self-pay | Admitting: *Deleted

## 2023-09-07 ENCOUNTER — Encounter: Payer: Self-pay | Admitting: Neurology

## 2023-09-07 ENCOUNTER — Ambulatory Visit (INDEPENDENT_AMBULATORY_CARE_PROVIDER_SITE_OTHER): Payer: Self-pay | Admitting: Neurology

## 2023-09-07 VITALS — BP 111/73 | HR 72 | Ht 66.0 in | Wt 254.5 lb

## 2023-09-07 DIAGNOSIS — Z79899 Other long term (current) drug therapy: Secondary | ICD-10-CM

## 2023-09-07 DIAGNOSIS — R260 Ataxic gait: Secondary | ICD-10-CM

## 2023-09-07 DIAGNOSIS — G35 Multiple sclerosis: Secondary | ICD-10-CM

## 2023-09-07 NOTE — Progress Notes (Signed)
[facility]  PATIENT: [name] Li DOB: 16-Sep-1981  REFERRING DOCTOR OR PCP:  Allyne Gee SOURCE: Patient, sister, interpreter, notes from Dr. Mordecai Maes, imaging reports, multiple MRI images personally reviewed.  _________________________________   HISTORICAL  CHIEF COMPLAINT:  Chief Complaint  Patient presents with   Follow-up    Pt in room 10,Interpreter in room [place]. Here for MS follow up. Patient reports some back pain that always hurts not new. Pt has fell twice since last visit, no head injury. Patient states she hears a ringing noise in both ears, no headaches.     HISTORY OF PRESENT ILLNESS:  [name] Li is a 42 yo woman with relapsing remitting multiple sclerosis.  Update 09/07/2023 She is  on Tysabri and tolerates it well.   She remains JCV Ab negative (07/03/2023 was 0.17 negative).  She was off when she was pregnant but back on since 2023.  She notes no exacerbations or new neurologic symptoms.   MRI show extesive change in her spinal cord  so Anti-MOG and anti-NMO and ANCA were checked after the MRiand were normal.    She had a large bill from [facility] as she has no insurance  She is concerned about weight gain recently  Gait and balance are doing well.  She gets a little bit of numbness in the hands at times but no significant numbness.  Strength is fine.  Vision is fine.  She denies urinary dysfunction.  She had some tingling in her legs that is intermittent.   She notes it more since her cholecystectomy.  She had had a lot of neck pain ad upper back pain after the surgery.      MRI cervical in 2020 showed some DDD at [facility] and C6C7.    She has only mild fatigue.     She is active and does all activities that she wants to do.  Some sleep disturbance since later in the pregnancy.    She denies any problems with mood or cognition.     MS history: She was diagnosed with MS March 2020 after presenting with neurologic symptoms  and abnormal imaging studies.   In January 2020, she felt numbness in her right scalp and the entire left side with a heavy sensation in her left leg.   She also had mild issues with balance and gait.  Her right leg felt weak but the numbness and pain was more noted than weakness.  She had MRI studies initially of the lumbar spine which did not show significant degenerative changes but did show some abnormal signal in the thoracic spinal cord concerning for demyelination.  She then had an MRI of the cervical and thoracic spine 11/30/2018 showing multiple lesions including some that enhance, consistent with MS.  She had an MRI of the brain on 12/03/2018 showing multiple lesions consistent with MS including one that enhanced.  She received 5 days of IV Solumedrol in a clinic.  She saw a neurologist in [place] and then had the infusions in [place]. She felt better after the steroids for the next few months. In retrospect she had numbness in her hands and legs at various times in 2019 and noted dysesthesias.     However, there were no imaging studies at that time.  No FH of MS.      Imaging: MRI of the brain 03/27/2023 showed  Scattered T2/FLAIR hyperintense foci in the cerebral hemispheres in a pattern consistent with chronic demyelinating plaque associated with multiple sclerosis.  None of the foci enhance or appear to be acute.    Partially empty sella.  This is usually an incidental finding but could also be seen with elevated intracranial pressure.  This appears unchanged compared to the previous MRI  MRI of the cervical spine 03/27/2023 showed Patchy T2 hyperintense foci within the central and posterior spinal cord from C2-C7. Compared to the MRI from 2020, there did not appear to be any new lesions.   MRI of the brain 08/12/2020 showed T2/FLAIR hyperintense foci in the periventricular, juxtacortical and deep white matter of the cerebral hemispheres in a pattern and configuration consistent with chronic  demyelinating plaque associated with multiple sclerosis.  None of the foci enhances or appears to be acute.  However, 2 of the chronic foci on the current MRI were not apparent on the 2020 MRI and likely occurred during the interim.  The abnormal enhancement associated with several of the lesions in 2020 has resolved and some of these foci are reduced in size compared to the previous MRI.  Normal enhancement pattern.  No acute findings. ' MRI brain 12/03/2018:   Multiple T2/flair hyperintense foci predominantly in the periventricular white matter with some in the in the juxtacortical white matter.  A large focus in the right medial temporal lobe enhances after contrast.  MRI cervical spine 12/01/2018: Extensive T2 hyperintense signal within the spinal cord.  Posteriorly to the left at C2, medially at C2-C3, posteriorly to the right at C3 and towards the right at C3 (this focus enhances), medially and posteriorly at C4-C5, left greater than right, anteriorly at C5-C6 (mild enhancement), medially at C6-C7.  MRI thoracic spine 12/01/2018: This study is degraded by motion.  Extensive foci throughout this thoracic spine with probable enhancement at T2-T3, T6-T7 and T8-T9 (difficult to be certain due to motion and seen enhancement only on axial not sagittal views)  MRI lumbar spine 10/27/2018: Degenerative changes towards the right at L4-L5 but no nerve root compression.  Abnormal signal within the visible thoracic spinal cord concerning for demyelination.   REVIEW OF SYSTEMS: Constitutional: No fevers, chills, sweats, or change in appetite Eyes: No visual changes, double vision, eye pain Ear, nose and throat: No hearing loss, ear pain, nasal congestion, sore throat Cardiovascular: No chest pain, palpitations Respiratory:  No shortness of breath at rest or with exertion.   No wheezes GastrointestinaI: No nausea, vomiting, diarrhea, abdominal pain, fecal incontinence Genitourinary:  No dysuria, urinary retention  or frequency.  No nocturia. Musculoskeletal:  No neck pain, back pain Integumentary: No rash, pruritus, skin lesions Neurological: as above Psychiatric: No depression at this time.  No anxiety Endocrine: No palpitations, diaphoresis, change in appetite, change in weigh or increased thirst Hematologic/Lymphatic:  No anemia, purpura, petechiae. Allergic/Immunologic: No itchy/runny eyes, nasal congestion, recent allergic reactions, rashes  ALLERGIES: No Known Allergies  HOME MEDICATIONS:  Current Outpatient Medications:    natalizumab (TYSABRI) 300 MG/15ML injection, Inject 300 mg into the vein every 28 (twenty-eight) days., Disp: , Rfl:    Calcium Carbonate Antacid (TUMS PO), Take 1 tablet by mouth every 4 (four) hours as needed (heartburn). (Patient not taking: Reported on 09/07/2023), Disp: , Rfl:    ibuprofen (ADVIL) 600 MG tablet, Take 1 tablet (600 mg total) by mouth 4 (four) times daily. (Patient not taking: Reported on 09/07/2023), Disp: 120 tablet, Rfl: 1   methocarbamol (ROBAXIN-750) 750 MG tablet, Take 1 tablet (750 mg total) by mouth 4 (four) times daily. (Patient not taking: Reported on 09/07/2023), Disp: 120  tablet, Rfl: 1   oxyCODONE (ROXICODONE) 5 MG immediate release tablet, Take 1 tablet (5 mg total) by mouth every 4 (four) hours as needed for severe pain. (Patient not taking: Reported on 09/07/2023), Disp: 30 tablet, Rfl: 0   Prenatal Vit-Fe Fumarate-FA (PRENATAL MULTIVITAMIN) TABS tablet, Take 1 tablet by mouth daily at 12 noon. (Patient not taking: Reported on 09/07/2023), Disp: , Rfl:   PAST MEDICAL HISTORY: Past Medical History:  Diagnosis Date   Depression    Gestational diabetes    glyburide   Multiple sclerosis (HCC)    Nausea and vomiting during pregnancy 04/12/2021    PAST SURGICAL HISTORY: Past Surgical History:  Procedure Laterality Date   CHOLECYSTECTOMY N/A 05/24/2022   Procedure: LAPAROSCOPIC CHOLECYSTECTOMY;  Surgeon: Diamantina Monks, MD;  Location:  [facility] OR;  Service: General;  Laterality: N/A;   NO PAST SURGERIES      FAMILY HISTORY: Family History  Problem Relation Age of Onset   Mental illness Mother        depression and OCD - sees psychiatrist   Multiple births Mother        pregnant with twins but lost them   Diabetes Father    Heart disease Father    Diabetes Brother    Thyroid disease Brother     SOCIAL HISTORY:  Social History   Socioeconomic History   Marital status: Married    Spouse name: Not on file   Number of children: Not on file   Years of education: Not on file   Highest education level: Not on file  Occupational History   Not on file  Tobacco Use   Smoking status: Never   Smokeless tobacco: Never  Vaping Use   Vaping status: Never Used  Substance and Sexual Activity   Alcohol use: No    Comment: socially   Drug use: No   Sexual activity: Yes    Birth control/protection: None  Other Topics Concern   Not on file  Social History Narrative   Not on file   Social Drivers of Health   Financial Resource Strain: Not on file  Food Insecurity: No Food Insecurity (03/08/2021)   Hunger Vital Sign    Worried About Running Out of Food in the Last Year: Never true    Ran Out of Food in the Last Year: Never true  Transportation Needs: No Transportation Needs (03/08/2021)   PRAPARE - Administrator, Civil Service (Medical): No    Lack of Transportation (Non-Medical): No  Physical Activity: Not on file  Stress: Not on file  Social Connections: Not on file  Intimate Partner Violence: Not on file     PHYSICAL EXAM  Vitals:   09/07/23 1047  BP: 111/73  Pulse: 72  Weight: 254 lb 8 oz (115.4 kg)  Height: 5\' 6"  (1.676 m)    Body mass index is 41.08 kg/m.   General: The patient is well-developed and well-nourished and in no acute distress.  She is [redacted] weeks pregnant today.  HEENT:  Head is [place]/AT.  Ear canals and TM's intact.      Skin: Extremities are without rash or   edema.  Neurologic Exam  Mental status: The patient is alert and oriented x 3 at the time of the examination. The patient has apparent normal recent and remote memory, with an apparently normal attention span and concentration ability.   Speech is normal.  Cranial nerves: Extraocular movements are full.  There is good facial sensation  to soft touch bilaterally.Facial strength is normal.  Trapezius and sternocleidomastoid strength is normal. No dysarthria is noted.     Hearing mildly reduced on left.  The Weber did not lateralize.  Motor:  Muscle bulk is normal.   Tone is normal. Strength is  5 / 5 in all 4 extremities.   Sensory: She reports reduced sensation to touch in the hands.  Normal sensation reported elsewhere.  Coordination: Cerebellar testing reveals good finger-nose-finger and heel-to-shin bilaterally.  Gait and station: Station is normal.  Gait was normal.  Tandem gait is wide.  Romberg is negative.    Reflexes: Deep tendon reflexes are symmetric and normal bilaterally.  Marland Kitchen    DIAGNOSTIC DATA (LABS, IMAGING, TESTING) - I reviewed patient records, labs, notes, testing and imaging myself where available.  Lab Results  Component Value Date   WBC 8.2 07/03/2023   HGB 13.1 07/03/2023   HCT 40.2 07/03/2023   MCV 85 07/03/2023   PLT 190 07/03/2023      Component Value Date/Time   NA 138 05/25/2022 0934   NA 135 03/03/2021 0854   K 4.0 05/25/2022 0934   CL 107 05/25/2022 0934   CO2 26 05/25/2022 0934   GLUCOSE 129 (H) 05/25/2022 0934   BUN <5 (L) 05/25/2022 0934   BUN 3 (L) 03/03/2021 0854   CREATININE 0.72 05/25/2022 0934   CREATININE 0.49 (L) 02/29/2016 0001   CALCIUM 8.6 (L) 05/25/2022 0934   PROT 6.1 (L) 05/25/2022 0934   PROT 6.6 03/03/2021 0854   ALBUMIN 3.3 (L) 05/25/2022 0934   ALBUMIN 4.2 03/03/2021 0854   AST 78 (H) 05/25/2022 0934   ALT 234 (H) 05/25/2022 0934   ALKPHOS 88 05/25/2022 0934   BILITOT 0.9 05/25/2022 0934   BILITOT 0.4 03/03/2021 0854    GFRNONAA >60 05/25/2022 0934   GFRAA >60 01/03/2018 1830   No results found for: "CHOL", "HDL", "LDLCALC", "LDLDIRECT", "TRIG", "CHOLHDL" Lab Results  Component Value Date   HGBA1C 6.1 (H) 02/24/2021   No results found for: "VITAMINB12" Lab Results  Component Value Date   TSH 1.960 03/03/2021       ASSESSMENT AND PLAN  Multiple sclerosis (HCC) - Plan: Stratify JCV Antibody Test ([facility]), CBC (no diff)  High risk medication use - Plan: Stratify JCV Antibody Test ([facility]), CBC (no diff)  Ataxic gait   1.   She will continue Tysabri.  Few weeks ago she was JCV antibody negative and will be retested in about 6 months.  Despite LETM in the spinal cord, NMO and MOG were negative.  While the spinal cord plaques were concerning for another type of demyelination, brain lesions are more typical for MS 2.    stay active and exercise as tolerated. 3.   She was left with a fairly large bill from [facility].  I advised her to try to contact the patient services to see if she might qualify for a cash pay discount.   She will return to see [place]  in 6 months or sooner if there are new or worsening neurologic symptoms.     Avira Tillison A. Epimenio Foot, MD, [facility] 09/07/2023, 11:36 AM Certified in Neurology, Clinical Neurophysiology, Sleep Medicine and Neuroimaging  [facility] [address], [address] [place], [place] [address] [phone]

## 2023-09-07 NOTE — Telephone Encounter (Signed)
Placed JCV lab in quest lock box for routine lab pick up. Results pending. 

## 2023-09-08 LAB — CBC
Hematocrit: 38.2 % (ref 34.0–46.6)
Hemoglobin: 12.5 g/dL (ref 11.1–15.9)
MCH: 27.7 pg (ref 26.6–33.0)
MCHC: 32.7 g/dL (ref 31.5–35.7)
MCV: 85 fL (ref 79–97)
Platelets: 189 10*3/uL (ref 150–450)
RBC: 4.52 x10E6/uL (ref 3.77–5.28)
RDW: 13.6 % (ref 11.7–15.4)
WBC: 8.7 10*3/uL (ref 3.4–10.8)

## 2023-09-11 ENCOUNTER — Telehealth: Payer: Self-pay | Admitting: Neurology

## 2023-09-11 NOTE — Telephone Encounter (Signed)
Pt's sister in law is asking RN calls her re: pt wanting to know if Dr Epimenio Foot is ok with pt taking Nexium and Evening Primrose oil, please call.

## 2023-09-12 NOTE — Telephone Encounter (Signed)
I called and spoke to Patrica, (sister in law) and informed per Dr. Epimenio Foot ok to take nexium and evening primrose oil.  She appreciated call back.  She verbalized understanding.

## 2023-09-14 NOTE — Telephone Encounter (Signed)
JVC Results 0.18 Negative

## 2024-01-22 ENCOUNTER — Other Ambulatory Visit: Payer: Self-pay | Admitting: *Deleted

## 2024-01-22 ENCOUNTER — Telehealth: Payer: Self-pay | Admitting: *Deleted

## 2024-01-22 ENCOUNTER — Other Ambulatory Visit: Payer: Self-pay

## 2024-01-22 DIAGNOSIS — G35 Multiple sclerosis: Secondary | ICD-10-CM

## 2024-01-22 DIAGNOSIS — Z79899 Other long term (current) drug therapy: Secondary | ICD-10-CM

## 2024-01-22 NOTE — Telephone Encounter (Signed)
 Placed JCV lab in [facility] lock box for routine lab pick up. Results pending.

## 2024-01-22 NOTE — Progress Notes (Deleted)
 Holly sent teams message asking for JCV and CBC order to be placed. Per Burdette Carolin, RN in infusion pt last JCV lab was done in Dec 2024. Next JCV/CBC due in June, I will leaving order placed today there for June lab draw.

## 2024-01-23 LAB — CBC WITH DIFFERENTIAL/PLATELET
Basophils Absolute: 0 10*3/uL (ref 0.0–0.2)
Basos: 0 %
EOS (ABSOLUTE): 0.1 10*3/uL (ref 0.0–0.4)
Eos: 2 %
Hematocrit: 39.7 % (ref 34.0–46.6)
Hemoglobin: 13.4 g/dL (ref 11.1–15.9)
Immature Grans (Abs): 0 10*3/uL (ref 0.0–0.1)
Immature Granulocytes: 0 %
Lymphocytes Absolute: 3.2 10*3/uL — ABNORMAL HIGH (ref 0.7–3.1)
Lymphs: 38 %
MCH: 28.4 pg (ref 26.6–33.0)
MCHC: 33.8 g/dL (ref 31.5–35.7)
MCV: 84 fL (ref 79–97)
Monocytes Absolute: 0.5 10*3/uL (ref 0.1–0.9)
Monocytes: 6 %
Neutrophils Absolute: 4.5 10*3/uL (ref 1.4–7.0)
Neutrophils: 54 %
Platelets: 184 10*3/uL (ref 150–450)
RBC: 4.72 x10E6/uL (ref 3.77–5.28)
RDW: 13.2 % (ref 11.7–15.4)
WBC: 8.3 10*3/uL (ref 3.4–10.8)

## 2024-03-18 ENCOUNTER — Ambulatory Visit (INDEPENDENT_AMBULATORY_CARE_PROVIDER_SITE_OTHER): Payer: Self-pay | Admitting: Family Medicine

## 2024-03-18 ENCOUNTER — Encounter: Payer: Self-pay | Admitting: Family Medicine

## 2024-03-18 VITALS — BP 115/73 | HR 76 | Ht 66.0 in | Wt 253.2 lb

## 2024-03-18 DIAGNOSIS — Z79899 Other long term (current) drug therapy: Secondary | ICD-10-CM

## 2024-03-18 DIAGNOSIS — R200 Anesthesia of skin: Secondary | ICD-10-CM

## 2024-03-18 DIAGNOSIS — Z758 Other problems related to medical facilities and other health care: Secondary | ICD-10-CM

## 2024-03-18 DIAGNOSIS — Z603 Acculturation difficulty: Secondary | ICD-10-CM

## 2024-03-18 DIAGNOSIS — G35 Multiple sclerosis: Secondary | ICD-10-CM

## 2024-03-18 DIAGNOSIS — G379 Demyelinating disease of central nervous system, unspecified: Secondary | ICD-10-CM

## 2024-03-18 DIAGNOSIS — Z5971 Insufficient health insurance coverage: Secondary | ICD-10-CM

## 2024-03-18 NOTE — Progress Notes (Signed)
 Chief Complaint  Patient presents with   Follow-up    Pt in room 1. Interpreter in room. Here for MS follow up. DMT: Tysabri, Next infusion date: 03/19/2024. No falls, no recent eye exam.    HISTORY OF PRESENT ILLNESS:  03/18/24 ALL:  [name] Li is a 43 y.o. female here today for follow up for RRMS. She continues Tysabri. She is tolerating infusions. Labs have been stable. Last JCV 0.13 12/2023. Lymph 3.2. MRI brian and cervical spine 03/2023 were stable.   She feels that she is doing fairly well. No obvious exacerbating symptoms. She has intermittent numbness of bottom of left foot, no pain. Intermittent numbness of both hands. Worse with holding a book or ironing. Seems better over the past year. She is walking for exercise and feels it helps. No trouble walking. No falls. No assistive device. No new weakness. Back pain is stable.   She does not sleep for long periods. She is up and down at night with her children. She feels mood is good. She does feel tingling on right side of head when she gets anxious. She is followed by PCP at a clinic in [place], [place] but unsure of name.   No bowel, bladder or vision changes.   [name] (husband) [phone]  [name] (son) [phone]  HISTORY (copied from Sater's previous note)  [name] Li is a 43 yo woman with relapsing remitting multiple sclerosis.   Update 07/26/2022: She delivered Novemebr 2022 and went back on Tysabri shortly afterwards.     She was on Tysabri and tolerated it well.   She remains JCV Ab negative (06/29/2022 was 0/22 negative).   The pregnancy was unplanned and she stopped the Tysabri around week 10.  She continues off on Tysabri and notes no exacerbations or new neurologic symptoms.  She does have some back pain and occasionally will have a little bit of numbness in the hands.   Gait and balance are doing well.  She gets a little bit of numbness in the hands at times but  no significant numbness.  Strength is fine.  Vision is fine.  She denies urinary dysfunction.   She had some tingling in her legs that is intermittent.   She notes it more since her cholecystectomy.  She had had a lot of neck pain ad upper back pain after the surgery.      MRI cervical in 2020 showed some DDD at C4C% and C6C7.     She has only mild fatigue.     She is active and does all activities that she wants to do.  Some sleep disturbance since later in the pregnancy.    She denies any problems with mood or cognition.      MS history: She was diagnosed with MS March 2020 after presenting with neurologic symptoms and abnormal imaging studies.   In January 2020, she felt numbness in her right scalp and the entire left side with a heavy sensation in her left leg.   She also had mild issues with balance and gait.  Her right leg felt weak but the numbness and pain was more noted than weakness.  She had MRI studies initially of the lumbar spine which did not show significant degenerative changes but did show some abnormal signal in the thoracic spinal cord concerning for demyelination.  She then had an MRI of the cervical and thoracic spine 11/30/2018 showing multiple lesions including some that enhance, consistent with MS.  She had an MRI of the brain on 12/03/2018 showing multiple lesions consistent with MS including one that enhanced.  She received 5 days of IV Solumedrol in a clinic.  She saw a neurologist in [place] and then had the infusions in [place]. She felt better after the steroids for the next few months. In retrospect she had numbness in her hands and legs at various times in 2019 and noted dysesthesias.     However, there were no imaging studies at that time.   No FH of MS.       Imaging: MRI of the brain 08/12/2020 showed T2/FLAIR hyperintense foci in the periventricular, juxtacortical and deep white matter of the cerebral hemispheres in a pattern and configuration consistent with chronic  demyelinating plaque associated with multiple sclerosis.  None of the foci enhances or appears to be acute.  However, 2 of the chronic foci on the current MRI were not apparent on the 2020 MRI and likely occurred during the interim.  The abnormal enhancement associated with several of the lesions in 2020 has resolved and some of these foci are reduced in size compared to the previous MRI.  Normal enhancement pattern.  No acute findings. ' MRI brain 12/03/2018:   Multiple T2/flair hyperintense foci predominantly in the periventricular white matter with some in the in the juxtacortical white matter.  A large focus in the right medial temporal lobe enhances after contrast.   MRI cervical spine 12/01/2018: Extensive T2 hyperintense signal within the spinal cord.  Posteriorly to the left at C2, medially at C2-C3, posteriorly to the right at C3 and towards the right at C3 (this focus enhances), medially and posteriorly at C4-C5, left greater than right, anteriorly at C5-C6 (mild enhancement), medially at C6-C7.   MRI thoracic spine 12/01/2018: This study is degraded by motion.  Extensive foci throughout this thoracic spine with probable enhancement at T2-T3, T6-T7 and T8-T9 (difficult to be certain due to motion and seen enhancement only on axial not sagittal views)   MRI lumbar spine 10/27/2018: Degenerative changes towards the right at L4-L5 but no nerve root compression.  Abnormal signal within the visible thoracic spinal cord concerning for demyelination.   REVIEW OF SYSTEMS: Out of a complete 14 system review of symptoms, the patient complains only of the following symptoms, intermittent numbness of left foot and bilateral hands, intermittent dizziness, anxiety and all other reviewed systems are negative.   ALLERGIES: No Known Allergies   HOME MEDICATIONS: Outpatient Medications Prior to Visit  Medication Sig Dispense Refill   ibuprofen  (ADVIL ) 600 MG tablet Take 600 mg by mouth every 6 (six) hours as  needed for headache.     MAGNESIUM  CITRATE PO Take 325 mg by mouth daily.     natalizumab (TYSABRI) 300 MG/15ML injection Inject 300 mg into the vein every 28 (twenty-eight) days.     OVER THE COUNTER MEDICATION Take 500 mg by mouth daily. METABOIL     Calcium Carbonate Antacid (TUMS PO) Take 1 tablet by mouth every 4 (four) hours as needed (heartburn). (Patient not taking: Reported on 03/18/2024)     ibuprofen  (ADVIL ) 600 MG tablet Take 1 tablet (600 mg total) by mouth 4 (four) times daily. (Patient not taking: Reported on 03/18/2024) 120 tablet 1   methocarbamol  (ROBAXIN -750) 750 MG tablet Take 1 tablet (750 mg total) by mouth 4 (four) times daily. (Patient not taking: Reported on 03/18/2024) 120 tablet 1   oxyCODONE  (ROXICODONE ) 5 MG immediate release tablet Take 1 tablet (5 mg  total) by mouth every 4 (four) hours as needed for severe pain. (Patient not taking: Reported on 03/18/2024) 30 tablet 0   Prenatal Vit-Fe Fumarate-FA (PRENATAL MULTIVITAMIN) TABS tablet Take 1 tablet by mouth daily at 12 noon. (Patient not taking: Reported on 03/18/2024)     No facility-administered medications prior to visit.     PAST MEDICAL HISTORY: Past Medical History:  Diagnosis Date   Depression    Gestational diabetes    glyburide    Multiple sclerosis (HCC)    Nausea and vomiting during pregnancy 04/12/2021     PAST SURGICAL HISTORY: Past Surgical History:  Procedure Laterality Date   CHOLECYSTECTOMY N/A 05/24/2022   Procedure: LAPAROSCOPIC CHOLECYSTECTOMY;  Surgeon: Paola Dreama SAILOR, MD;  Location: [facility] OR;  Service: General;  Laterality: N/A;   NO PAST SURGERIES       FAMILY HISTORY: Family History  Problem Relation Age of Onset   Mental illness Mother        depression and OCD - sees psychiatrist   Multiple births Mother        pregnant with twins but lost them   Diabetes Father    Heart disease Father    Diabetes Brother    Thyroid disease Brother      SOCIAL HISTORY: Social History    Socioeconomic History   Marital status: Married    Spouse name: Not on file   Number of children: Not on file   Years of education: Not on file   Highest education level: Not on file  Occupational History   Not on file  Tobacco Use   Smoking status: Never   Smokeless tobacco: Never  Vaping Use   Vaping status: Never Used  Substance and Sexual Activity   Alcohol use: No    Comment: socially   Drug use: No   Sexual activity: Yes    Birth control/protection: None  Other Topics Concern   Not on file  Social History Narrative   Not on file   Social Drivers of Health   Financial Resource Strain: Not on file  Food Insecurity: No Food Insecurity (03/08/2021)   Hunger Vital Sign    Worried About Running Out of Food in the Last Year: Never true    Ran Out of Food in the Last Year: Never true  Transportation Needs: No Transportation Needs (03/08/2021)   PRAPARE - Administrator, Civil Service (Medical): No    Lack of Transportation (Non-Medical): No  Physical Activity: Not on file  Stress: Not on file  Social Connections: Not on file  Intimate Partner Violence: Not on file     PHYSICAL EXAM  Vitals:   03/18/24 1451  BP: 115/73  Pulse: 76  SpO2: 98%  Weight: 253 lb 3.2 oz (114.9 kg)  Height: 5' 6 (1.676 m)     Body mass index is 40.87 kg/m.  Generalized: Well developed, in no acute distress  Cardiology: normal rate and rhythm, no murmur auscultated  Respiratory: clear to auscultation bilaterally    Neurological examination  Mentation: Alert oriented to time, place, history taking. Follows all commands speech and language fluent Cranial nerve II-XII: Pupils were equal round reactive to light. Extraocular movements were full, visual field were full on confrontational test. Facial sensation and strength were normal. Uvula tongue midline. Head turning and shoulder shrug  were normal and symmetric. Motor: The motor testing reveals 5 over 5 strength of all  4 extremities. Good symmetric motor tone is noted throughout.  Sensory:  Sensory testing is intact to soft touch on all 4 extremities. No evidence of extinction is noted. Positive Phalen bilaterally, positive Tinel in right hand  Coordination: Cerebellar testing reveals good finger-nose-finger and heel-to-shin bilaterally.  Gait and station: Gait is normal.  Reflexes: Deep tendon reflexes are symmetric and normal bilaterally.    DIAGNOSTIC DATA (LABS, IMAGING, TESTING) - I reviewed patient records, labs, notes, testing and imaging myself where available.  Lab Results  Component Value Date   WBC 8.3 01/22/2024   HGB 13.4 01/22/2024   HCT 39.7 01/22/2024   MCV 84 01/22/2024   PLT 184 01/22/2024      Component Value Date/Time   NA 138 05/25/2022 0934   NA 135 03/03/2021 0854   K 4.0 05/25/2022 0934   CL 107 05/25/2022 0934   CO2 26 05/25/2022 0934   GLUCOSE 129 (H) 05/25/2022 0934   BUN <5 (L) 05/25/2022 0934   BUN 3 (L) 03/03/2021 0854   CREATININE 0.72 05/25/2022 0934   CREATININE 0.49 (L) 02/29/2016 0001   CALCIUM 8.6 (L) 05/25/2022 0934   PROT 6.1 (L) 05/25/2022 0934   PROT 6.6 03/03/2021 0854   ALBUMIN 3.3 (L) 05/25/2022 0934   ALBUMIN 4.2 03/03/2021 0854   AST 78 (H) 05/25/2022 0934   ALT 234 (H) 05/25/2022 0934   ALKPHOS 88 05/25/2022 0934   BILITOT 0.9 05/25/2022 0934   BILITOT 0.4 03/03/2021 0854   GFRNONAA >60 05/25/2022 0934   GFRAA >60 01/03/2018 1830   No results found for: CHOL, HDL, LDLCALC, LDLDIRECT, TRIG, CHOLHDL Lab Results  Component Value Date   HGBA1C 6.1 (H) 02/24/2021   No results found for: VITAMINB12 Lab Results  Component Value Date   TSH 1.960 03/03/2021        No data to display               No data to display           ASSESSMENT AND PLAN  43 y.o. year old female  has a past medical history of Depression, Gestational diabetes, Multiple sclerosis (HCC), and Nausea and vomiting during pregnancy  (04/12/2021). here with    Multiple sclerosis (HCC)  High risk medication use  Demyelinating disease (HCC)  Uninsured  Language barrier  Numbness  [name] Li is doing well, today. MS symptoms appear stable. Labs stable 12/2023. We will continue Tysabri. Imaging stable 03/2023. Healthy lifestyle habits encouraged. She will follow up with PCP as directed. Advised to update eye exam annually. She will return to see Dr Vear in 6 months, sooner if needed. She verbalizes understanding and agreement with this plan.    No orders of the defined types were placed in this encounter.    No orders of the defined types were placed in this encounter.  Interpreter assisted with visit.   Greig Forbes, MSN, FNP-C 03/18/2024, 3:52 PM  [facility] [address] [place], [place] [address] [phone]

## 2024-03-18 NOTE — Patient Instructions (Signed)
 Below is our plan:  We will continue to monitor. Labs and imaging were stable. Update your eye exam annually. Continue daily exercise.   Please make sure you are staying well hydrated. I recommend 50-60 ounces daily. Well balanced diet and regular exercise encouraged. Consistent sleep schedule with 6-8 hours recommended.   Please continue follow up with care team as directed.   Follow up with Dr Vear in 6 months   You may receive a survey regarding today's visit. I encourage you to leave honest feed back as I do use this information to improve patient care. Thank you for seeing me today!

## 2024-07-10 ENCOUNTER — Encounter: Payer: Self-pay | Admitting: Neurology

## 2024-07-11 ENCOUNTER — Other Ambulatory Visit: Payer: Self-pay

## 2024-07-11 ENCOUNTER — Other Ambulatory Visit: Payer: Self-pay | Admitting: *Deleted

## 2024-07-11 ENCOUNTER — Telehealth: Payer: Self-pay | Admitting: *Deleted

## 2024-07-11 DIAGNOSIS — G35D Multiple sclerosis, unspecified: Secondary | ICD-10-CM

## 2024-07-11 DIAGNOSIS — Z79899 Other long term (current) drug therapy: Secondary | ICD-10-CM

## 2024-07-11 NOTE — Telephone Encounter (Signed)
 Placed JCV lab in [facility] lock box for routine lab pick up. Results pending.

## 2024-07-12 LAB — CBC WITH DIFFERENTIAL/PLATELET
Basophils Absolute: 0 x10E3/uL (ref 0.0–0.2)
Basos: 0 %
EOS (ABSOLUTE): 0.1 x10E3/uL (ref 0.0–0.4)
Eos: 1 %
Hematocrit: 38.8 % (ref 34.0–46.6)
Hemoglobin: 12.2 g/dL (ref 11.1–15.9)
Immature Grans (Abs): 0 x10E3/uL (ref 0.0–0.1)
Immature Granulocytes: 0 %
Lymphocytes Absolute: 2.1 x10E3/uL (ref 0.7–3.1)
Lymphs: 29 %
MCH: 27.1 pg (ref 26.6–33.0)
MCHC: 31.4 g/dL — ABNORMAL LOW (ref 31.5–35.7)
MCV: 86 fL (ref 79–97)
Monocytes Absolute: 0.3 x10E3/uL (ref 0.1–0.9)
Monocytes: 4 %
Neutrophils Absolute: 4.7 x10E3/uL (ref 1.4–7.0)
Neutrophils: 66 %
Platelets: 178 x10E3/uL (ref 150–450)
RBC: 4.5 x10E6/uL (ref 3.77–5.28)
RDW: 14.2 % (ref 11.7–15.4)
WBC: 7.2 x10E3/uL (ref 3.4–10.8)

## 2024-09-30 ENCOUNTER — Telehealth: Payer: Self-pay | Admitting: *Deleted

## 2024-09-30 NOTE — Telephone Encounter (Signed)
" °  Form faxed to [phone], confirmation received.  "

## 2024-10-15 ENCOUNTER — Ambulatory Visit (INDEPENDENT_AMBULATORY_CARE_PROVIDER_SITE_OTHER): Payer: Self-pay | Admitting: Neurology

## 2024-10-15 ENCOUNTER — Encounter: Payer: Self-pay | Admitting: Neurology

## 2024-10-15 VITALS — BP 130/73 | HR 72 | Ht 67.0 in | Wt 250.0 lb

## 2024-10-15 DIAGNOSIS — G379 Demyelinating disease of central nervous system, unspecified: Secondary | ICD-10-CM

## 2024-10-15 DIAGNOSIS — R260 Ataxic gait: Secondary | ICD-10-CM

## 2024-10-15 DIAGNOSIS — G35A Relapsing-remitting multiple sclerosis: Secondary | ICD-10-CM

## 2024-10-15 DIAGNOSIS — Z79899 Other long term (current) drug therapy: Secondary | ICD-10-CM

## 2024-10-15 DIAGNOSIS — R200 Anesthesia of skin: Secondary | ICD-10-CM

## 2024-10-15 NOTE — Progress Notes (Signed)
 "  [facility]  PATIENT: [name] Li DOB: 12/31/1980  REFERRING DOCTOR OR PCP:  Gustav Patch SOURCE: Patient, sister, interpreter, notes from Dr. Patch, imaging reports, multiple MRI images personally reviewed.  _________________________________   HISTORICAL  CHIEF COMPLAINT:  Chief Complaint  Patient presents with   Follow-up    Room 10 With translator  MS     HISTORY OF PRESENT ILLNESS:  [name] Li is a 44 yo woman with relapsing remitting multiple sclerosis.  Update 10/15/2024 She is  on Tysabri and tolerates it well.   She remains JCV Ab negative (07/11/2024 was 0.14 negative).  She was off when she was pregnant but back on since 2023.  She notes no exacerbations or new neurologic symptoms.   MRI show extesive change in her spinal cord  so Anti-MOG and anti-NMO and ANCA were checked after the MRiand were normal.    She had a large bill from [facility] as she has no insurance  She is concerned about weight gain recently  Gait and balance are doing well.  Only 1 fall when she tripped on a toy. She sometimes holds the walls going downstairs.   She gets a little bit of numbness in the hands often when she sleeps on the hand.  Strength is fine.  Vision is fine.  She denies urinary dysfunction.  She had had some neck pain and burning - usually if laying down a while.    MRI cervical in 2020 showed some DDD at [facility] and C6C7.    She has only mild fatigue and sleeps well..   She denies any problems with mood or cognition.     MS history: She was diagnosed with MS March 2020 after presenting with neurologic symptoms and abnormal imaging studies.   In January 2020, she felt numbness in her right scalp and the entire left side with a heavy sensation in her left leg.   She also had mild issues with balance and gait.  Her right leg felt weak but the numbness and pain was more noted than weakness.  She had MRI studies initially of the lumbar spine  which did not show significant degenerative changes but did show some abnormal signal in the thoracic spinal cord concerning for demyelination.  She then had an MRI of the cervical and thoracic spine 11/30/2018 showing multiple lesions including some that enhance, consistent with MS.  She had an MRI of the brain on 12/03/2018 showing multiple lesions consistent with MS including one that enhanced.  She received 5 days of IV Solumedrol in a clinic.  She saw a neurologist in [place] and then had the infusions in [place]. She felt better after the steroids for the next few months. In retrospect she had numbness in her hands and legs at various times in 2019 and noted dysesthesias.     However, there were no imaging studies at that time.  No FH of MS.      Imaging: MRI of the brain 03/27/2023 showed  Scattered T2/FLAIR hyperintense foci in the cerebral hemispheres in a pattern consistent with chronic demyelinating plaque associated with multiple sclerosis.  None of the foci enhance or appear to be acute.    Partially empty sella.  This is usually an incidental finding but could also be seen with elevated intracranial pressure.  This appears unchanged compared to the previous MRI  MRI of the cervical spine 03/27/2023 showed Patchy T2 hyperintense foci within the central and posterior spinal cord from C2-C7. Compared  to the MRI from 2020, there did not appear to be any new lesions.   MRI of the brain 08/12/2020 showed T2/FLAIR hyperintense foci in the periventricular, juxtacortical and deep white matter of the cerebral hemispheres in a pattern and configuration consistent with chronic demyelinating plaque associated with multiple sclerosis.  None of the foci enhances or appears to be acute.  However, 2 of the chronic foci on the current MRI were not apparent on the 2020 MRI and likely occurred during the interim.  The abnormal enhancement associated with several of the lesions in 2020 has resolved and some of these  foci are reduced in size compared to the previous MRI.  Normal enhancement pattern.  No acute findings. ' MRI brain 12/03/2018:   Multiple T2/flair hyperintense foci predominantly in the periventricular white matter with some in the in the juxtacortical white matter.  A large focus in the right medial temporal lobe enhances after contrast.  MRI cervical spine 12/01/2018: Extensive T2 hyperintense signal within the spinal cord.  Posteriorly to the left at C2, medially at C2-C3, posteriorly to the right at C3 and towards the right at C3 (this focus enhances), medially and posteriorly at C4-C5, left greater than right, anteriorly at C5-C6 (mild enhancement), medially at C6-C7.  MRI thoracic spine 12/01/2018: This study is degraded by motion.  Extensive foci throughout this thoracic spine with probable enhancement at T2-T3, T6-T7 and T8-T9 (difficult to be certain due to motion and seen enhancement only on axial not sagittal views)  MRI lumbar spine 10/27/2018: Degenerative changes towards the right at L4-L5 but no nerve root compression.  Abnormal signal within the visible thoracic spinal cord concerning for demyelination.   REVIEW OF SYSTEMS: Constitutional: No fevers, chills, sweats, or change in appetite Eyes: No visual changes, double vision, eye pain Ear, nose and throat: No hearing loss, ear pain, nasal congestion, sore throat Cardiovascular: No chest pain, palpitations Respiratory:  No shortness of breath at rest or with exertion.   No wheezes GastrointestinaI: No nausea, vomiting, diarrhea, abdominal pain, fecal incontinence Genitourinary:  No dysuria, urinary retention or frequency.  No nocturia. Musculoskeletal:  No neck pain, back pain Integumentary: No rash, pruritus, skin lesions Neurological: as above Psychiatric: No depression at this time.  No anxiety Endocrine: No palpitations, diaphoresis, change in appetite, change in weigh or increased thirst Hematologic/Lymphatic:  No anemia,  purpura, petechiae. Allergic/Immunologic: No itchy/runny eyes, nasal congestion, recent allergic reactions, rashes  ALLERGIES: No Known Allergies  HOME MEDICATIONS:  Current Outpatient Medications:    ibuprofen  (ADVIL ) 600 MG tablet, Take 600 mg by mouth every 6 (six) hours as needed for headache., Disp: , Rfl:    MAGNESIUM  CITRATE PO, Take 325 mg by mouth daily., Disp: , Rfl:    natalizumab (TYSABRI) 300 MG/15ML injection, Inject 300 mg into the vein every 28 (twenty-eight) days., Disp: , Rfl:    OVER THE COUNTER MEDICATION, Take 500 mg by mouth daily. METABOIL, Disp: , Rfl:    Calcium Carbonate Antacid (TUMS PO), Take 1 tablet by mouth every 4 (four) hours as needed (heartburn). (Patient not taking: Reported on 10/15/2024), Disp: , Rfl:   PAST MEDICAL HISTORY: Past Medical History:  Diagnosis Date   Depression    Gestational diabetes    glyburide    Nausea and vomiting during pregnancy 04/12/2021    PAST SURGICAL HISTORY: Past Surgical History:  Procedure Laterality Date   CHOLECYSTECTOMY N/A 05/24/2022   Procedure: LAPAROSCOPIC CHOLECYSTECTOMY;  Surgeon: Paola Dreama SAILOR, MD;  Location: [facility] OR;  Service: General;  Laterality: N/A;   NO PAST SURGERIES      FAMILY HISTORY: Family History  Problem Relation Age of Onset   Mental illness Mother        depression and OCD - sees psychiatrist   Multiple births Mother        pregnant with twins but lost them   Diabetes Father    Heart disease Father    Diabetes Brother    Thyroid disease Brother     SOCIAL HISTORY:  Social History   Socioeconomic History   Marital status: Married    Spouse name: Not on file   Number of children: Not on file   Years of education: Not on file   Highest education level: Not on file  Occupational History   Not on file  Tobacco Use   Smoking status: Never   Smokeless tobacco: Never  Vaping Use   Vaping status: Never Used  Substance and Sexual Activity   Alcohol use: No    Comment:  socially   Drug use: No   Sexual activity: Yes    Birth control/protection: None  Other Topics Concern   Not on file  Social History Narrative   Not on file   Social Drivers of Health   Tobacco Use: Low Risk (10/15/2024)   Patient History    Smoking Tobacco Use: Never    Smokeless Tobacco Use: Never    Passive Exposure: Not on file  Financial Resource Strain: Not on file  Food Insecurity: Not on file  Transportation Needs: Not on file  Physical Activity: Not on file  Stress: Not on file  Social Connections: Not on file  Intimate Partner Violence: Not on file  Depression (PHQ2-9): Low Risk (10/07/2021)   Depression (PHQ2-9)    PHQ-2 Score: 2  Alcohol Screen: Not on file  Housing: Not on file  Utilities: Not on file  Health Literacy: Not on file     PHYSICAL EXAM  Vitals:   10/15/24 1514  BP: 130/73  Pulse: 72  SpO2: 92%  Weight: 250 lb (113.4 kg)  Height: 5' 7 (1.702 m)    Body mass index is 39.16 kg/m.   General: The patient is well-developed and well-nourished and in no acute distress.  She is [redacted] weeks pregnant today.  HEENT:  Head is /AT.  Ear canals and TM's intact.      Skin: Extremities are without rash or  edema.  Neurologic Exam  Mental status: The patient is alert and oriented x 3 at the time of the examination. The patient has apparent normal recent and remote memory, with an apparently normal attention span and concentration ability.   Speech is normal.  Cranial nerves: Extraocular movements are full.  There is good facial sensation to soft touch bilaterally.Facial strength is normal.  Trapezius and sternocleidomastoid strength is normal. No dysarthria is noted.     Hearing mildly reduced on left.  The Weber did not lateralize.  Motor:  Muscle bulk is normal.   Tone is normal. Strength is  5 / 5 in all 4 extremities.   Sensory: She reports reduced sensation to touch in the hands.  Normal sensation reported elsewhere.  Coordination: Cerebellar  testing reveals good finger-nose-finger and heel-to-shin bilaterally.  Gait and station: Station is normal.  Gait was normal.   her tandem gait is wide..  Romberg is negative.    Reflexes: Deep tendon reflexes are symmetric and normal bilaterally.  SABRA    DIAGNOSTIC DATA (  LABS, IMAGING, TESTING) - I reviewed patient records, labs, notes, testing and imaging myself where available.  Lab Results  Component Value Date   WBC 7.2 07/11/2024   HGB 12.2 07/11/2024   HCT 38.8 07/11/2024   MCV 86 07/11/2024   PLT 178 07/11/2024      Component Value Date/Time   NA 138 05/25/2022 0934   NA 135 03/03/2021 0854   K 4.0 05/25/2022 0934   CL 107 05/25/2022 0934   CO2 26 05/25/2022 0934   GLUCOSE 129 (H) 05/25/2022 0934   BUN <5 (L) 05/25/2022 0934   BUN 3 (L) 03/03/2021 0854   CREATININE 0.72 05/25/2022 0934   CREATININE 0.49 (L) 02/29/2016 0001   CALCIUM 8.6 (L) 05/25/2022 0934   PROT 6.1 (L) 05/25/2022 0934   PROT 6.6 03/03/2021 0854   ALBUMIN 3.3 (L) 05/25/2022 0934   ALBUMIN 4.2 03/03/2021 0854   AST 78 (H) 05/25/2022 0934   ALT 234 (H) 05/25/2022 0934   ALKPHOS 88 05/25/2022 0934   BILITOT 0.9 05/25/2022 0934   BILITOT 0.4 03/03/2021 0854   GFRNONAA >60 05/25/2022 0934   GFRAA >60 01/03/2018 1830   No results found for: CHOL, HDL, LDLCALC, LDLDIRECT, TRIG, CHOLHDL Lab Results  Component Value Date   HGBA1C 6.1 (H) 02/24/2021   No results found for: VITAMINB12 Lab Results  Component Value Date   TSH 1.960 03/03/2021       ASSESSMENT AND PLAN  Multiple sclerosis, relapsing-remitting  High risk medication use  Demyelinating disease (HCC)  Numbness  Ataxic gait   1.   Continue Tysabri.  She has been JCV antibody negative and will be retested in about 3 months.  If changes to positive, will consider a different DMT such as Kesimpta.  Despite LETM in the spinal cord, NMO and MOG were negative.  While the spinal cord plaques were concerning for  another type of demyelination, brain lesions are more typical for MS 2.    stay active and exercise as tolerated. 3.   She will return to see us   in 6 months or sooner if there are new or worsening neurologic symptoms.     [name] Li A. Vear, MD, Teola RENO 10/15/2024, 3:21 PM Certified in Neurology, Clinical Neurophysiology, Sleep Medicine and Neuroimaging  [facility] [address] [place], [place] [address] [phone] "

## 2025-05-08 ENCOUNTER — Ambulatory Visit: Payer: Self-pay | Admitting: Adult Health
